# Patient Record
Sex: Male | Born: 1945 | Race: White | Hispanic: No | Marital: Married | Smoking: Former smoker
Health system: Southern US, Community
[De-identification: ages and names within clinical notes are randomized; demographics above are authoritative.]

## PROBLEM LIST (undated history)

## (undated) DIAGNOSIS — R0989 Other specified symptoms and signs involving the circulatory and respiratory systems: Secondary | ICD-10-CM

## (undated) DIAGNOSIS — E119 Type 2 diabetes mellitus without complications: Secondary | ICD-10-CM

## (undated) DIAGNOSIS — E785 Hyperlipidemia, unspecified: Secondary | ICD-10-CM

## (undated) DIAGNOSIS — U071 COVID-19: Secondary | ICD-10-CM

## (undated) DIAGNOSIS — N189 Chronic kidney disease, unspecified: Secondary | ICD-10-CM

## (undated) DIAGNOSIS — I6529 Occlusion and stenosis of unspecified carotid artery: Secondary | ICD-10-CM

## (undated) DIAGNOSIS — K219 Gastro-esophageal reflux disease without esophagitis: Secondary | ICD-10-CM

## (undated) DIAGNOSIS — K759 Inflammatory liver disease, unspecified: Secondary | ICD-10-CM

## (undated) DIAGNOSIS — I2510 Atherosclerotic heart disease of native coronary artery without angina pectoris: Secondary | ICD-10-CM

## (undated) DIAGNOSIS — E781 Pure hyperglyceridemia: Secondary | ICD-10-CM

## (undated) DIAGNOSIS — I10 Essential (primary) hypertension: Secondary | ICD-10-CM

## (undated) DIAGNOSIS — D649 Anemia, unspecified: Secondary | ICD-10-CM

## (undated) DIAGNOSIS — E669 Obesity, unspecified: Secondary | ICD-10-CM

## (undated) DIAGNOSIS — H269 Unspecified cataract: Secondary | ICD-10-CM

## (undated) HISTORY — DX: Chronic kidney disease, unspecified: N18.9

## (undated) HISTORY — DX: Other specified symptoms and signs involving the circulatory and respiratory systems: R09.89

## (undated) HISTORY — PX: OTHER SURGICAL HISTORY: SHX169

## (undated) HISTORY — DX: Pure hyperglyceridemia: E78.1

## (undated) HISTORY — DX: Atherosclerotic heart disease of native coronary artery without angina pectoris: I25.10

## (undated) HISTORY — DX: Inflammatory liver disease, unspecified: K75.9

## (undated) HISTORY — PX: APPENDECTOMY: SHX54

## (undated) HISTORY — PX: KNEE SURGERY: SHX244

## (undated) HISTORY — PX: ANGIOPLASTY: SHX39

## (undated) HISTORY — DX: Type 2 diabetes mellitus without complications: E11.9

## (undated) HISTORY — DX: Obesity, unspecified: E66.9

## (undated) HISTORY — DX: Essential (primary) hypertension: I10

## (undated) HISTORY — DX: Unspecified cataract: H26.9

## (undated) HISTORY — PX: EYE SURGERY: SHX253

## (undated) HISTORY — DX: Anemia, unspecified: D64.9

## (undated) HISTORY — DX: Gastro-esophageal reflux disease without esophagitis: K21.9

## (undated) HISTORY — DX: Occlusion and stenosis of unspecified carotid artery: I65.29

## (undated) HISTORY — DX: Hyperlipidemia, unspecified: E78.5

## (undated) HISTORY — PX: CATARACT EXTRACTION: SUR2

---

## 2001-02-17 ENCOUNTER — Inpatient Hospital Stay (HOSPITAL_COMMUNITY): Admission: RE | Admit: 2001-02-17 | Discharge: 2001-02-19 | Payer: Self-pay | Admitting: Cardiology

## 2001-02-17 ENCOUNTER — Encounter: Payer: Self-pay | Admitting: Cardiology

## 2001-05-14 ENCOUNTER — Emergency Department (HOSPITAL_COMMUNITY): Admission: EM | Admit: 2001-05-14 | Discharge: 2001-05-14 | Payer: Self-pay | Admitting: Emergency Medicine

## 2001-05-14 ENCOUNTER — Encounter: Payer: Self-pay | Admitting: Emergency Medicine

## 2003-09-15 ENCOUNTER — Encounter: Admission: RE | Admit: 2003-09-15 | Discharge: 2003-12-14 | Payer: Self-pay | Admitting: Family Medicine

## 2005-04-17 ENCOUNTER — Ambulatory Visit (HOSPITAL_COMMUNITY): Admission: RE | Admit: 2005-04-17 | Discharge: 2005-04-17 | Payer: Self-pay | Admitting: Cardiology

## 2007-02-01 ENCOUNTER — Emergency Department (HOSPITAL_COMMUNITY): Admission: EM | Admit: 2007-02-01 | Discharge: 2007-02-01 | Payer: Self-pay | Admitting: Emergency Medicine

## 2007-03-05 ENCOUNTER — Encounter: Admission: RE | Admit: 2007-03-05 | Discharge: 2007-03-05 | Payer: Self-pay | Admitting: Orthopedic Surgery

## 2007-04-07 ENCOUNTER — Encounter: Admission: RE | Admit: 2007-04-07 | Discharge: 2007-04-07 | Payer: Self-pay | Admitting: Internal Medicine

## 2007-04-08 ENCOUNTER — Encounter: Admission: RE | Admit: 2007-04-08 | Discharge: 2007-04-08 | Payer: Self-pay | Admitting: Internal Medicine

## 2007-05-11 ENCOUNTER — Ambulatory Visit: Payer: Self-pay | Admitting: Vascular Surgery

## 2007-05-27 ENCOUNTER — Ambulatory Visit: Payer: Self-pay | Admitting: Internal Medicine

## 2007-06-10 ENCOUNTER — Encounter: Payer: Self-pay | Admitting: Internal Medicine

## 2007-06-10 ENCOUNTER — Ambulatory Visit: Payer: Self-pay | Admitting: Internal Medicine

## 2008-07-27 ENCOUNTER — Ambulatory Visit: Payer: Self-pay | Admitting: Vascular Surgery

## 2008-11-04 HISTORY — PX: PR VEIN BYPASS GRAFT,AORTO-FEM-POP: 35551

## 2008-12-14 ENCOUNTER — Encounter: Admission: RE | Admit: 2008-12-14 | Discharge: 2008-12-14 | Payer: Self-pay | Admitting: Cardiology

## 2009-03-17 ENCOUNTER — Encounter: Payer: Self-pay | Admitting: Cardiology

## 2009-03-17 ENCOUNTER — Ambulatory Visit (HOSPITAL_COMMUNITY): Admission: RE | Admit: 2009-03-17 | Discharge: 2009-03-17 | Payer: Self-pay | Admitting: Cardiology

## 2009-03-21 ENCOUNTER — Ambulatory Visit: Payer: Self-pay | Admitting: Cardiothoracic Surgery

## 2009-03-22 ENCOUNTER — Ambulatory Visit: Payer: Self-pay | Admitting: Cardiothoracic Surgery

## 2009-03-22 ENCOUNTER — Inpatient Hospital Stay (HOSPITAL_COMMUNITY): Admission: RE | Admit: 2009-03-22 | Discharge: 2009-03-26 | Payer: Self-pay | Admitting: Cardiothoracic Surgery

## 2009-04-13 ENCOUNTER — Encounter: Admission: RE | Admit: 2009-04-13 | Discharge: 2009-04-13 | Payer: Self-pay | Admitting: Cardiothoracic Surgery

## 2009-04-13 ENCOUNTER — Ambulatory Visit: Payer: Self-pay | Admitting: Cardiothoracic Surgery

## 2009-10-13 ENCOUNTER — Ambulatory Visit (HOSPITAL_COMMUNITY): Admission: RE | Admit: 2009-10-13 | Discharge: 2009-10-14 | Payer: Self-pay | Admitting: Cardiology

## 2010-03-26 ENCOUNTER — Ambulatory Visit: Payer: Self-pay | Admitting: Surgery

## 2010-05-22 ENCOUNTER — Encounter: Payer: Self-pay | Admitting: Internal Medicine

## 2010-07-06 ENCOUNTER — Ambulatory Visit: Payer: Self-pay | Admitting: Vascular Surgery

## 2010-08-30 ENCOUNTER — Ambulatory Visit: Payer: Self-pay | Admitting: Cardiology

## 2010-10-05 ENCOUNTER — Ambulatory Visit: Payer: Self-pay | Admitting: Cardiology

## 2010-10-12 ENCOUNTER — Ambulatory Visit: Payer: Self-pay | Admitting: Vascular Surgery

## 2010-10-31 ENCOUNTER — Ambulatory Visit: Payer: Self-pay | Admitting: Cardiology

## 2010-12-04 NOTE — Letter (Signed)
Summary: Colonoscopy Letter  [facility]  [address] [place], [place] [address]   Phone: [phone]  Fax: [phone]      May 22, 2010 MRN: [id number]   DAKAI BRAITHWAITE [address] [place], [place]  [address]   Dear Mr. BENNINGER,   According to your medical record, it is time for you to schedule a Colonoscopy. The American Cancer Society recommends this procedure as a method to detect early colon cancer. Patients with a family history of colon cancer, or a personal history of colon polyps or inflammatory bowel disease are at increased risk.  This letter has been generated based on the recommendations made at the time of your procedure. If you feel that in your particular situation this may no longer apply, please contact our office.  Please call our office at [phone] to schedule this appointment or to update your records at your earliest convenience.  Thank you for cooperating with [place] to provide you with the very best care possible.   Sincerely,  Wilhemina Bonito. Marina Goodell, M.D.  [facility] [phone]

## 2011-02-05 LAB — CBC
MCHC: 32.3 g/dL (ref 30.0–36.0)
Platelets: 342 10*3/uL (ref 150–400)
RDW: 17.1 % — ABNORMAL HIGH (ref 11.5–15.5)

## 2011-02-05 LAB — BASIC METABOLIC PANEL
BUN: 13 mg/dL (ref 6–23)
CO2: 28 mEq/L (ref 19–32)
Calcium: 8.5 mg/dL (ref 8.4–10.5)
Creatinine, Ser: 1.42 mg/dL (ref 0.4–1.5)
Glucose, Bld: 132 mg/dL — ABNORMAL HIGH (ref 70–99)

## 2011-02-05 LAB — GLUCOSE, CAPILLARY
Glucose-Capillary: 119 mg/dL — ABNORMAL HIGH (ref 70–99)
Glucose-Capillary: 90 mg/dL (ref 70–99)

## 2011-02-05 LAB — CK TOTAL AND CKMB (NOT AT ARMC)
Relative Index: INVALID (ref 0.0–2.5)
Total CK: 70 U/L (ref 7–232)

## 2011-02-12 LAB — POCT I-STAT 3, ART BLOOD GAS (G3+)
Acid-base deficit: 1 mmol/L (ref 0.0–2.0)
Acid-base deficit: 2 mmol/L (ref 0.0–2.0)
Acid-base deficit: 2 mmol/L (ref 0.0–2.0)
Acid-base deficit: 3 mmol/L — ABNORMAL HIGH (ref 0.0–2.0)
Bicarbonate: 21.1 mEq/L (ref 20.0–24.0)
Bicarbonate: 22.4 mEq/L (ref 20.0–24.0)
Bicarbonate: 22.5 mEq/L (ref 20.0–24.0)
Bicarbonate: 23.5 mEq/L (ref 20.0–24.0)
O2 Saturation: 100 %
O2 Saturation: 94 %
O2 Saturation: 95 %
O2 Saturation: 97 %
Patient temperature: 36.1
Patient temperature: 37.1
Patient temperature: 37.2
TCO2: 22 mmol/L (ref 0–100)
TCO2: 24 mmol/L (ref 0–100)
TCO2: 24 mmol/L (ref 0–100)
TCO2: 25 mmol/L (ref 0–100)
pCO2 arterial: 30.3 mmHg — ABNORMAL LOW (ref 35.0–45.0)
pCO2 arterial: 36.8 mmHg (ref 35.0–45.0)
pCO2 arterial: 37.5 mmHg (ref 35.0–45.0)
pCO2 arterial: 37.7 mmHg (ref 35.0–45.0)
pH, Arterial: 7.38 (ref 7.350–7.450)
pH, Arterial: 7.395 (ref 7.350–7.450)
pH, Arterial: 7.404 (ref 7.350–7.450)
pH, Arterial: 7.451 — ABNORMAL HIGH (ref 7.350–7.450)
pO2, Arterial: 282 mmHg — ABNORMAL HIGH (ref 80.0–100.0)
pO2, Arterial: 68 mmHg — ABNORMAL LOW (ref 80.0–100.0)
pO2, Arterial: 71 mmHg — ABNORMAL LOW (ref 80.0–100.0)
pO2, Arterial: 92 mmHg (ref 80.0–100.0)

## 2011-02-12 LAB — POCT I-STAT 4, (NA,K, GLUC, HGB,HCT)
Glucose, Bld: 149 mg/dL — ABNORMAL HIGH (ref 70–99)
Glucose, Bld: 157 mg/dL — ABNORMAL HIGH (ref 70–99)
Glucose, Bld: 175 mg/dL — ABNORMAL HIGH (ref 70–99)
Glucose, Bld: 177 mg/dL — ABNORMAL HIGH (ref 70–99)
Glucose, Bld: 186 mg/dL — ABNORMAL HIGH (ref 70–99)
Glucose, Bld: 204 mg/dL — ABNORMAL HIGH (ref 70–99)
HCT: 24 % — ABNORMAL LOW (ref 39.0–52.0)
HCT: 25 % — ABNORMAL LOW (ref 39.0–52.0)
HCT: 27 % — ABNORMAL LOW (ref 39.0–52.0)
HCT: 32 % — ABNORMAL LOW (ref 39.0–52.0)
HCT: 36 % — ABNORMAL LOW (ref 39.0–52.0)
HCT: 39 % (ref 39.0–52.0)
Hemoglobin: 10.9 g/dL — ABNORMAL LOW (ref 13.0–17.0)
Hemoglobin: 12.2 g/dL — ABNORMAL LOW (ref 13.0–17.0)
Hemoglobin: 13.3 g/dL (ref 13.0–17.0)
Hemoglobin: 8.2 g/dL — ABNORMAL LOW (ref 13.0–17.0)
Hemoglobin: 8.5 g/dL — ABNORMAL LOW (ref 13.0–17.0)
Hemoglobin: 9.2 g/dL — ABNORMAL LOW (ref 13.0–17.0)
Potassium: 3.7 mEq/L (ref 3.5–5.1)
Potassium: 3.8 mEq/L (ref 3.5–5.1)
Potassium: 4.3 mEq/L (ref 3.5–5.1)
Potassium: 4.4 mEq/L (ref 3.5–5.1)
Potassium: 4.4 mEq/L (ref 3.5–5.1)
Potassium: 4.8 mEq/L (ref 3.5–5.1)
Sodium: 133 mEq/L — ABNORMAL LOW (ref 135–145)
Sodium: 134 mEq/L — ABNORMAL LOW (ref 135–145)
Sodium: 135 mEq/L (ref 135–145)
Sodium: 136 mEq/L (ref 135–145)
Sodium: 136 mEq/L (ref 135–145)
Sodium: 138 mEq/L (ref 135–145)

## 2011-02-12 LAB — BLOOD GAS, ARTERIAL
Acid-Base Excess: 2.1 mmol/L — ABNORMAL HIGH (ref 0.0–2.0)
Bicarbonate: 26.1 mEq/L — ABNORMAL HIGH (ref 20.0–24.0)
Drawn by: 206361
FIO2: 0.21 %
O2 Saturation: 94 %
Patient temperature: 98.6
TCO2: 27.3 mmol/L (ref 0–100)
pCO2 arterial: 40.1 mmHg (ref 35.0–45.0)
pH, Arterial: 7.429 (ref 7.350–7.450)
pO2, Arterial: 78 mmHg — ABNORMAL LOW (ref 80.0–100.0)

## 2011-02-12 LAB — GLUCOSE, CAPILLARY
Glucose-Capillary: 101 mg/dL — ABNORMAL HIGH (ref 70–99)
Glucose-Capillary: 108 mg/dL — ABNORMAL HIGH (ref 70–99)
Glucose-Capillary: 109 mg/dL — ABNORMAL HIGH (ref 70–99)
Glucose-Capillary: 110 mg/dL — ABNORMAL HIGH (ref 70–99)
Glucose-Capillary: 127 mg/dL — ABNORMAL HIGH (ref 70–99)
Glucose-Capillary: 132 mg/dL — ABNORMAL HIGH (ref 70–99)
Glucose-Capillary: 133 mg/dL — ABNORMAL HIGH (ref 70–99)
Glucose-Capillary: 135 mg/dL — ABNORMAL HIGH (ref 70–99)
Glucose-Capillary: 136 mg/dL — ABNORMAL HIGH (ref 70–99)
Glucose-Capillary: 136 mg/dL — ABNORMAL HIGH (ref 70–99)
Glucose-Capillary: 140 mg/dL — ABNORMAL HIGH (ref 70–99)
Glucose-Capillary: 150 mg/dL — ABNORMAL HIGH (ref 70–99)
Glucose-Capillary: 153 mg/dL — ABNORMAL HIGH (ref 70–99)
Glucose-Capillary: 154 mg/dL — ABNORMAL HIGH (ref 70–99)
Glucose-Capillary: 157 mg/dL — ABNORMAL HIGH (ref 70–99)
Glucose-Capillary: 160 mg/dL — ABNORMAL HIGH (ref 70–99)
Glucose-Capillary: 161 mg/dL — ABNORMAL HIGH (ref 70–99)
Glucose-Capillary: 161 mg/dL — ABNORMAL HIGH (ref 70–99)
Glucose-Capillary: 163 mg/dL — ABNORMAL HIGH (ref 70–99)
Glucose-Capillary: 164 mg/dL — ABNORMAL HIGH (ref 70–99)
Glucose-Capillary: 164 mg/dL — ABNORMAL HIGH (ref 70–99)
Glucose-Capillary: 175 mg/dL — ABNORMAL HIGH (ref 70–99)
Glucose-Capillary: 176 mg/dL — ABNORMAL HIGH (ref 70–99)
Glucose-Capillary: 180 mg/dL — ABNORMAL HIGH (ref 70–99)
Glucose-Capillary: 43 mg/dL — ABNORMAL LOW (ref 70–99)
Glucose-Capillary: 88 mg/dL (ref 70–99)
Glucose-Capillary: 117 mg/dL — ABNORMAL HIGH (ref 70–99)

## 2011-02-12 LAB — BASIC METABOLIC PANEL
BUN: 10 mg/dL (ref 6–23)
BUN: 12 mg/dL (ref 6–23)
BUN: 14 mg/dL (ref 6–23)
BUN: 16 mg/dL (ref 6–23)
BUN: 9 mg/dL (ref 6–23)
CO2: 23 mEq/L (ref 19–32)
CO2: 27 mEq/L (ref 19–32)
CO2: 28 mEq/L (ref 19–32)
CO2: 28 mEq/L (ref 19–32)
CO2: 28 mEq/L (ref 19–32)
Calcium: 8.1 mg/dL — ABNORMAL LOW (ref 8.4–10.5)
Calcium: 8.1 mg/dL — ABNORMAL LOW (ref 8.4–10.5)
Calcium: 8.2 mg/dL — ABNORMAL LOW (ref 8.4–10.5)
Calcium: 8.2 mg/dL — ABNORMAL LOW (ref 8.4–10.5)
Calcium: 8.8 mg/dL (ref 8.4–10.5)
Chloride: 100 mEq/L (ref 96–112)
Chloride: 103 mEq/L (ref 96–112)
Chloride: 105 mEq/L (ref 96–112)
Chloride: 105 mEq/L (ref 96–112)
Chloride: 107 mEq/L (ref 96–112)
Creatinine, Ser: 1.23 mg/dL (ref 0.4–1.5)
Creatinine, Ser: 1.25 mg/dL (ref 0.4–1.5)
Creatinine, Ser: 1.27 mg/dL (ref 0.4–1.5)
Creatinine, Ser: 1.3 mg/dL (ref 0.4–1.5)
Creatinine, Ser: 1.4 mg/dL (ref 0.4–1.5)
GFR calc Af Amer: >60 mL/min (ref >60)
GFR calc Af Amer: >60 mL/min (ref >60)
GFR calc Af Amer: >60 mL/min (ref >60)
GFR calc Af Amer: >60 mL/min (ref >60)
GFR calc Af Amer: >60 mL/min (ref >60)
GFR calc non Af Amer: 51 mL/min — ABNORMAL LOW (ref >60)
GFR calc non Af Amer: 56 mL/min — ABNORMAL LOW (ref >60)
GFR calc non Af Amer: 57 mL/min — ABNORMAL LOW (ref >60)
GFR calc non Af Amer: 58 mL/min — ABNORMAL LOW (ref >60)
GFR calc non Af Amer: 59 mL/min — ABNORMAL LOW (ref >60)
Glucose, Bld: 129 mg/dL — ABNORMAL HIGH (ref 70–99)
Glucose, Bld: 137 mg/dL — ABNORMAL HIGH (ref 70–99)
Glucose, Bld: 147 mg/dL — ABNORMAL HIGH (ref 70–99)
Glucose, Bld: 181 mg/dL — ABNORMAL HIGH (ref 70–99)
Glucose, Bld: 199 mg/dL — ABNORMAL HIGH (ref 70–99)
Potassium: 3.6 mEq/L (ref 3.5–5.1)
Potassium: 3.8 mEq/L (ref 3.5–5.1)
Potassium: 3.9 mEq/L (ref 3.5–5.1)
Potassium: 4.3 mEq/L (ref 3.5–5.1)
Potassium: 4.5 mEq/L (ref 3.5–5.1)
Sodium: 135 mEq/L (ref 135–145)
Sodium: 136 mEq/L (ref 135–145)
Sodium: 137 mEq/L (ref 135–145)
Sodium: 140 mEq/L (ref 135–145)
Sodium: 140 mEq/L (ref 135–145)

## 2011-02-12 LAB — TYPE AND SCREEN
ABO/RH(D): A POS
Antibody Screen: NEGATIVE

## 2011-02-12 LAB — COMPREHENSIVE METABOLIC PANEL
ALT: 21 U/L (ref 0–53)
AST: 25 U/L (ref 0–37)
Albumin: 3.8 g/dL (ref 3.5–5.2)
Alkaline Phosphatase: 40 U/L (ref 39–117)
BUN: 14 mg/dL (ref 6–23)
CO2: 23 mEq/L (ref 19–32)
Calcium: 9.7 mg/dL (ref 8.4–10.5)
Chloride: 102 mEq/L (ref 96–112)
Creatinine, Ser: 1.48 mg/dL (ref 0.4–1.5)
GFR calc Af Amer: 58 mL/min — ABNORMAL LOW (ref >60)
GFR calc non Af Amer: 48 mL/min — ABNORMAL LOW (ref >60)
Glucose, Bld: 161 mg/dL — ABNORMAL HIGH (ref 70–99)
Potassium: 4.4 mEq/L (ref 3.5–5.1)
Sodium: 135 mEq/L (ref 135–145)
Total Bilirubin: 0.5 mg/dL (ref 0.3–1.2)
Total Protein: 7 g/dL (ref 6.0–8.3)

## 2011-02-12 LAB — CBC
HCT: 27 % — ABNORMAL LOW (ref 39.0–52.0)
HCT: 27.4 % — ABNORMAL LOW (ref 39.0–52.0)
HCT: 27.9 % — ABNORMAL LOW (ref 39.0–52.0)
HCT: 28.1 % — ABNORMAL LOW (ref 39.0–52.0)
HCT: 30.4 % — ABNORMAL LOW (ref 39.0–52.0)
HCT: 30.5 % — ABNORMAL LOW (ref 39.0–52.0)
HCT: 37.9 % — ABNORMAL LOW (ref 39.0–52.0)
Hemoglobin: 10 g/dL — ABNORMAL LOW (ref 13.0–17.0)
Hemoglobin: 10.1 g/dL — ABNORMAL LOW (ref 13.0–17.0)
Hemoglobin: 12.8 g/dL — ABNORMAL LOW (ref 13.0–17.0)
Hemoglobin: 9.1 g/dL — ABNORMAL LOW (ref 13.0–17.0)
Hemoglobin: 9.2 g/dL — ABNORMAL LOW (ref 13.0–17.0)
Hemoglobin: 9.3 g/dL — ABNORMAL LOW (ref 13.0–17.0)
Hemoglobin: 9.4 g/dL — ABNORMAL LOW (ref 13.0–17.0)
MCHC: 32.8 g/dL (ref 30.0–36.0)
MCHC: 33.2 g/dL (ref 30.0–36.0)
MCHC: 33.3 g/dL (ref 30.0–36.0)
MCHC: 33.5 g/dL (ref 30.0–36.0)
MCHC: 33.6 g/dL (ref 30.0–36.0)
MCHC: 33.8 g/dL (ref 30.0–36.0)
MCHC: 33.8 g/dL (ref 30.0–36.0)
MCV: 83.2 fL (ref 78.0–100.0)
MCV: 83.2 fL (ref 78.0–100.0)
MCV: 83.3 fL (ref 78.0–100.0)
MCV: 83.6 fL (ref 78.0–100.0)
MCV: 83.7 fL (ref 78.0–100.0)
MCV: 83.9 fL (ref 78.0–100.0)
MCV: 84 fL (ref 78.0–100.0)
Platelets: 154 10*3/uL (ref 150–400)
Platelets: 177 10*3/uL (ref 150–400)
Platelets: 182 10*3/uL (ref 150–400)
Platelets: 194 10*3/uL (ref 150–400)
Platelets: 197 10*3/uL (ref 150–400)
Platelets: 217 10*3/uL (ref 150–400)
Platelets: 277 10*3/uL (ref 150–400)
RBC: 3.24 MIL/uL — ABNORMAL LOW (ref 4.22–5.81)
RBC: 3.28 MIL/uL — ABNORMAL LOW (ref 4.22–5.81)
RBC: 3.34 MIL/uL — ABNORMAL LOW (ref 4.22–5.81)
RBC: 3.35 MIL/uL — ABNORMAL LOW (ref 4.22–5.81)
RBC: 3.63 MIL/uL — ABNORMAL LOW (ref 4.22–5.81)
RBC: 3.65 MIL/uL — ABNORMAL LOW (ref 4.22–5.81)
RBC: 4.53 MIL/uL (ref 4.22–5.81)
RDW: 14.6 % (ref 11.5–15.5)
RDW: 14.7 % (ref 11.5–15.5)
RDW: 14.9 % (ref 11.5–15.5)
RDW: 15 % (ref 11.5–15.5)
RDW: 15.2 % (ref 11.5–15.5)
RDW: 15.3 % (ref 11.5–15.5)
RDW: 15.4 % (ref 11.5–15.5)
WBC: 10.1 10*3/uL (ref 4.0–10.5)
WBC: 10.8 10*3/uL — ABNORMAL HIGH (ref 4.0–10.5)
WBC: 11.5 10*3/uL — ABNORMAL HIGH (ref 4.0–10.5)
WBC: 12.6 10*3/uL — ABNORMAL HIGH (ref 4.0–10.5)
WBC: 8.1 10*3/uL (ref 4.0–10.5)
WBC: 9.1 10*3/uL (ref 4.0–10.5)
WBC: 9.6 10*3/uL (ref 4.0–10.5)

## 2011-02-12 LAB — CREATININE, SERUM
Creatinine, Ser: 1.29 mg/dL (ref 0.4–1.5)
GFR calc Af Amer: >60 mL/min (ref >60)
GFR calc non Af Amer: 56 mL/min — ABNORMAL LOW (ref >60)

## 2011-02-12 LAB — POCT I-STAT, CHEM 8
BUN: 10 mg/dL (ref 6–23)
BUN: 12 mg/dL (ref 6–23)
Calcium, Ion: 1.14 mmol/L (ref 1.12–1.32)
Calcium, Ion: 1.19 mmol/L (ref 1.12–1.32)
Chloride: 103 mEq/L (ref 96–112)
Chloride: 105 mEq/L (ref 96–112)
Creatinine, Ser: 1.4 mg/dL (ref 0.4–1.5)
Creatinine, Ser: 1.4 mg/dL (ref 0.4–1.5)
Glucose, Bld: 154 mg/dL — ABNORMAL HIGH (ref 70–99)
Glucose, Bld: 185 mg/dL — ABNORMAL HIGH (ref 70–99)
HCT: 30 % — ABNORMAL LOW (ref 39.0–52.0)
HCT: 32 % — ABNORMAL LOW (ref 39.0–52.0)
Hemoglobin: 10.2 g/dL — ABNORMAL LOW (ref 13.0–17.0)
Hemoglobin: 10.9 g/dL — ABNORMAL LOW (ref 13.0–17.0)
Potassium: 4.1 mEq/L (ref 3.5–5.1)
Potassium: 4.4 mEq/L (ref 3.5–5.1)
Sodium: 138 mEq/L (ref 135–145)
Sodium: 139 mEq/L (ref 135–145)
TCO2: 22 mmol/L (ref 0–100)
TCO2: 25 mmol/L (ref 0–100)

## 2011-02-12 LAB — HEMOGLOBIN A1C
Hgb A1c MFr Bld: 6.6 % — ABNORMAL HIGH (ref 4.6–6.1)
Mean Plasma Glucose: 143 mg/dL

## 2011-02-12 LAB — URINALYSIS, ROUTINE W REFLEX MICROSCOPIC
Bilirubin Urine: NEGATIVE
Glucose, UA: NEGATIVE mg/dL
Hgb urine dipstick: NEGATIVE
Ketones, ur: NEGATIVE mg/dL
Leukocytes, UA: NEGATIVE
Nitrite: NEGATIVE
Protein, ur: 30 mg/dL — AB
Specific Gravity, Urine: 1.01 (ref 1.005–1.030)
Urobilinogen, UA: 1 mg/dL (ref 0.0–1.0)
pH: 6 (ref 5.0–8.0)

## 2011-02-12 LAB — HEMOGLOBIN AND HEMATOCRIT, BLOOD
HCT: 25.2 % — ABNORMAL LOW (ref 39.0–52.0)
Hemoglobin: 8.5 g/dL — ABNORMAL LOW (ref 13.0–17.0)

## 2011-02-12 LAB — MAGNESIUM
Magnesium: 2.1 mg/dL (ref 1.5–2.5)
Magnesium: 2.6 mg/dL — ABNORMAL HIGH (ref 1.5–2.5)
Magnesium: 2.7 mg/dL — ABNORMAL HIGH (ref 1.5–2.5)

## 2011-02-12 LAB — URINE MICROSCOPIC-ADD ON

## 2011-02-12 LAB — PROTIME-INR
INR: 1 (ref 0.00–1.49)
INR: 1.5 (ref 0.00–1.49)
Prothrombin Time: 13.6 seconds (ref 11.6–15.2)
Prothrombin Time: 18.6 seconds — ABNORMAL HIGH (ref 11.6–15.2)

## 2011-02-12 LAB — CK TOTAL AND CKMB (NOT AT ARMC)
CK, MB: 6.2 ng/mL — ABNORMAL HIGH (ref 0.3–4.0)
Relative Index: 2.2 (ref 0.0–2.5)
Total CK: 280 U/L — ABNORMAL HIGH (ref 7–232)

## 2011-02-12 LAB — ABO/RH: ABO/RH(D): A POS

## 2011-02-12 LAB — APTT
aPTT: 27 seconds (ref 24–37)
aPTT: 33 seconds (ref 24–37)

## 2011-02-12 LAB — PLATELET COUNT: Platelets: 244 10*3/uL (ref 150–400)

## 2011-03-19 NOTE — Procedures (Signed)
CAROTID DUPLEX EXAM   INDICATION:  Carotid artery bruit.   HISTORY:  Diabetes:  Yes.  Cardiac:  Coronary artery disease, PTA and stent.  Hypertension:  Yes.  Smoking:  No.  Previous Surgery:  Right knee surgery x2 since March 2008.  Amaurosis Fugax No, Paresthesias No, Hemiparesis No.   REFERRING PHYSICIAN:  W. Buren Kos, M.D.                                       RIGHT             LEFT  Brachial systolic pressure:         128.              124.  Brachial Doppler waveforms:         Triphasic.        Triphasic.  Vertebral direction of flow:        Antegrade.        Antegrade.  DUPLEX VELOCITIES (cm/sec)  CCA peak systolic                   87.               99.  ECA peak systolic                   139.              116.  ICA peak systolic                   187.              141.  ICA end diastolic                   65.               51.  PLAQUE MORPHOLOGY:                  Mixed.            Mixed.  PLAQUE AMOUNT:                      Moderate.         Moderate.  PLAQUE LOCATION:                    ICA, ECA.         ICA.   IMPRESSION:  1. Bilateral 40-59% internal carotid artery stenosis, upper end of      range on the right.  2. Mild right external carotid artery stenosis.  3. A preliminary copy was faxed to Dr. Alver Fisher office and a report was      called to the office.   ___________________________________________  Di Kindle. Edilia Bo, M.D.   DP/MEDQ  D:  05/11/2007  T:  05/12/2007  Job:  [id number]
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
## 2011-03-19 NOTE — Assessment & Plan Note (Signed)
OFFICE VISIT   QADIR, FOLKS  DOB:  [birth year]                                       10/12/2010  UXLKG#:[id number]   This is an established patient.   HISTORY OF PRESENT ILLNESS:  This is a 65 year old gentleman well-known  to me that was found to have previously an increase in his left internal  carotid artery stenosis.  At this point he continues to be completely  asymptomatic without any symptoms of stroke or TIA.  There has been no  change in his medication profile and he does note that he has been  making a concerted effort to lose weight and he has had some weight loss  since then; otherwise there have been no changes in his neurologic  status or any of his medications.   His past medical history, past surgical history, social history, family  history, medications and allergies are completely identical to the  September 2 visit.   On his review of systems today he noted weight loss, change in eyesight  and hiatal hernia.  Otherwise the rest of his review of systems was  negative.   PHYSICAL EXAMINATION:  Today he had a blood pressure 135/81 in the left  arm.  In his right arm he had a blood pressure of 124/75.  Heart rate  was 80.  His respirations were 12, satting 99%.  General:  He is well-  developed, well-nourished.  Neurologic:  Cranial nerves II-XII were  intact.  His motor strength was 5/5.  Sensation was grossly intact in  all extremities.  In his right arm he had palpable radial and brachials  and he had complete sensation intact throughout this extremity.   On his noninvasive vascular imaging he had bilateral carotids done.  On  the right side his velocity was lower than last time with peak systolic  172 cm/sec consistent with 40%-59% stenosis.  On his left side he had a  peak systolic velocity of 228 cm/sec and this is consistent with a left  internal carotid artery stenosis 60%-79%.  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   MEDICAL DECISION MAKING:  This is a pleasant 65 year old gentleman who  has asymptomatic bilateral internal carotid artery stenosis.  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  Additionally his pulses are completely intact in right upper  extremity.  He has no symptomatology consistent with vertebrobasilar  pathology.  I discussed with the patient that confirmatory studies would  likely include an angiogram.  Any procedure crossing the arch carries  approximately 1% stroke rate in the major studies so I did not think  confirmatory study in his case was indicated as he is completely  asymptomatic.  At this point we will continue with q.6 month  surveillance of his bilateral internal carotid artery stenosis.  With  less than 80% stenoses bilateral, there is no compelling indication at  this point for proceeding with endarterectomy on either side.  If he  develops any symptomatology from regards to his right subclavian artery  at that point we will readdress the issue and possibly angio the arch  and also the right arm.  Thank you  for giving me the opportunity to  participate in this patient's care.     Leonides Sake, MD  Electronically Signed   BC/MEDQ  D:  10/12/2010  T:  10/12/2010  Job:  [id number]   cc:   Kari Baars, M.D.

## 2011-03-19 NOTE — Procedures (Signed)
CAROTID DUPLEX EXAM   INDICATION:  Follow up carotid stenosis.   HISTORY:  Diabetes:  Yes.  Cardiac:  CAD, stent.  Hypertension:  Yes.  Smoking:  No.  Previous Surgery:  CV History:  Amaurosis Fugax No, Paresthesias No, Hemiparesis No.                                       RIGHT             LEFT  Brachial systolic pressure:         148               142  Brachial Doppler waveforms:         Normal            Normal  Vertebral direction of flow:        Antegrade/hesitant                  Antegrade  DUPLEX VELOCITIES (cm/sec)  CCA peak systolic                   94                87  ECA peak systolic                   126               86  ICA peak systolic                   170               158  ICA end diastolic                   61                49  PLAQUE MORPHOLOGY:                  Heterogenous      Heterogenous  PLAQUE AMOUNT:                      Moderate          Moderate  PLAQUE LOCATION:                    ICA               ICA   IMPRESSION:  1. 40-59% stenosis of the bilateral internal carotid arteries.  2. The right vertebral artery flow demonstrates early systolic      deceleration with an elevated velocity of 270 cm/s noted in the      proximal right subclavian artery.  3. No significant change noted in the bilateral carotid arteries when      compared to the previous examination of 05/11/07.   A preliminary report was faxed to Dr. Alver Fisher office at 4:15 on 07/27/08.   ___________________________________________  Larina Earthly, M.D.   CH/MEDQ  D:  07/27/2008  T:  07/27/2008  Job:  [id number]

## 2011-03-19 NOTE — Op Note (Signed)
NAMEVICENTE, WEIDLER                  ACCOUNT NO.:  [id number]   MEDICAL RECORD NO.:  [id number]          PATIENT TYPE:  INP   LOCATION:  2310                         FACILITY:  [facility]   PHYSICIAN:  Sheliah Plane, MD    DATE OF BIRTH:  04-19-1946   DATE OF PROCEDURE:  03/22/2009  DATE OF DISCHARGE:                               OPERATIVE REPORT   PREOPERATIVE DIAGNOSIS:  Coronary occlusive disease with diabetes and  positive stress test.   POSTOPERATIVE DIAGNOSIS:  Coronary occlusive disease with diabetes and  positive stress test.   SURGICAL PROCEDURE:  Coronary artery bypass grafting x3 with left  internal mammary to the left anterior descending coronary artery,  reversed saphenous vein graft to the obtuse marginal coronary artery,  reversed saphenous vein graft to the posterior descending coronary  artery with right endovein harvesting.   SURGEON:  Sheliah Plane, MD.   FIRST ASSISTANT:  Evelene Croon, MD   SECOND ASSISTANT:  Rowe Clack, PA-C   BRIEF HISTORY:  The patient is a 65 year old diabetic male with  longstanding history of coronary artery disease, having had multiple  angioplasties as early as 1995.  In addition, he has had stent placed in  his proximal circumflex coronary artery.  He presents now with abnormal  Cardiolite stress test with lateral ischemia.  Cardiac catheterization  was performed, which demonstrated diffuse disease throughout the LAD,  diffuse disease in the right coronary artery with a relatively small  posterior descending and a moderate-sized obtuse marginal vessel with a  90-95% proximal stenosis just before the coronary stent.  Because of the  patient's diffuse disease and unsuitability of angioplasty on the  proximal circumflex vessel, coronary artery bypass grafting was  recommended to the patient, who agreed and signed informed consent.   DESCRIPTION OF PROCEDURE:  With Swan-Ganz and arterial line monitors in  place, the patient  underwent general endotracheal anesthesia without  incident.  Skin of chest and legs was prepped with Betadine and draped  in the usual sterile manner.  Using the [employer] endovein harvesting  system, a vein was ultimately harvested from the right; however,  initially we made a small incision at the left knee; however, the vein  at this area was small and not usable, so vein was located in the right  and was of good quality and caliber.  Median sternotomy was performed  and left internal mammary artery was dissected down to his pedicle  graft.  The distal artery was divided, had good free flow.  Pericardium  was opened.  Overall ventricular function appeared preserved.  He was  systemically heparinized.  Ascending aorta was cannulated.  The right  atrium was cannulated and aortic root vent cardioplegia needle was  introduced into the ascending aorta.  The patient was placed on  cardiopulmonary bypass 2.4 L/min/m2.  Consideration for placing of the  left internal mammary to the circumflex was entertained; however, the  side of anastomosis was relatively high on the lateral wall and it was  decided it would allow better to place the mammary to the distal LAD.  Aortic crossclamp was applied and 500 mL of cold blood potassium  cardioplegia was administered with diastolic arrest.  The heart  myocardial septal temperature was monitored throughout the crossclamp  period.  Attention was turned first to the circumflex coronary artery,  which was partially intramyocardial.  The vessel was opened and was  reasonable size, but with some diffuse disease.  Using a running 7-0  Prolene, distal anastomosis was performed with a second reverse  saphenous vein graft.  Additional cold blood cardioplegia was  administered down the vein.  Attention was then turned to the posterior  descending coronary artery, which was a relatively small vessel and  admitted a 1-mm probe distally.  Using a running 7-0 Prolene,  distal  anastomosis with a second reverse saphenous vein graft was carried out.  Attention was then turned to the LAD. which was a very diffusely  diseased vessel in the distal third, vessel was opened, admitted a 1-mm  probe distally and proximally.  Using a running 8-0 Prolene, left  internal mammary artery was anastomosed to the left anterior descending  coronary artery.  There was rise of myocardial septal temperature with  release of bulldog on the mammary artery.  The bulldog was placed back  on the mammary artery.  Additional cold blood cardioplegia was  administered with the crossclamps still in place, two punch aortotomies  were performed in the ascending aorta and each of the 2 vein grafts were  anastomosed to the ascending aorta.  Air was evacuated from the grafts  and sites of anastomosis were observed and free of bleeding.  The  patient was then ventilated and weaned from cardiopulmonary bypass  without difficulty, converted to a sinus rhythm spontaneously.  Decannulated in usual fashion.  Protamine sulfate was administered with  operative field hemostatic.  Two atrial, two ventricular pacing wires  were applied.  Graft markers were applied.  A left pleural tube and  Blake mediastinal drain were left in place.  Sternum was closed with #6  stainless steel wire.  Fascia closed with interrupted 0 Vicryl, running  3-0 Vicryl in subcutaneous tissue, and a 4-0 subcuticular stitch in the  skin edges.  Dry dressings were applied.  Sponge and needle count was  reported as correct at completion of the procedure.  The patient  tolerated the procedure without obvious complication and was transferred  to surgical intensive care unit for further postoperative care.      Sheliah Plane, MD  Electronically Signed     EG/MEDQ  D:  03/23/2009  T:  03/24/2009  Job:  [id number]   cc:   Colleen Can. Deborah Chalk, M.D.
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
## 2011-03-19 NOTE — Procedures (Signed)
CAROTID DUPLEX EXAM   INDICATION:  Followup carotid artery disease.   HISTORY:  Diabetes:  Yes.  Cardiac:  CAD and a stent.  Hypertension:  Yes.  Smoking:  No.  Previous Surgery:  No.  CV History:  The patient is currently asymptomatic.  Amaurosis Fugax No, Paresthesias No, Hemiparesis No                                       RIGHT             LEFT  Brachial systolic pressure:         138               142  Brachial Doppler waveforms:         WNL               WNL  Vertebral direction of flow:        Partial subclavian steal            Antegrade  DUPLEX VELOCITIES (cm/sec)  CCA peak systolic                   71                64  ECA peak systolic                   121               117  ICA peak systolic                   172               228  ICA end diastolic                   53                75  PLAQUE MORPHOLOGY:                  Heterogeneous     Heterogeneous  PLAQUE AMOUNT:                      Moderate          Moderate  PLAQUE LOCATION:                    ICA and bulb      ICA and bulb   IMPRESSION:  Right-sided internal carotid artery 40%-59% stenosis.  Left  internal carotid artery 60%-79% stenosis.  Bilateral intimal thickening  within the common carotid arteries.  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  Study stable compared to previous.   ___________________________________________  Leonides Sake, MD   OD/MEDQ  D:  10/12/2010  T:  10/12/2010  Job:  [id number]

## 2011-03-19 NOTE — Cardiovascular Report (Signed)
NAMEKARMINE, KAUER NO.:  [id number]   MEDICAL RECORD NO.:  [id number]          PATIENT TYPE:  OIB   LOCATION:  2899                         FACILITY:  [facility]   PHYSICIAN:  Colleen Can. Deborah Chalk, M.D.DATE OF BIRTH:  [birth year]   DATE OF PROCEDURE:  03/17/2009  DATE OF DISCHARGE:  03/17/2009                            CARDIAC CATHETERIZATION   PROCEDURE:  Left heart catheterization with selective coronary  angiography and left ventricular angiography.   TYPE AND SITE OF ENTRY:  Percutaneous right femoral artery, percutaneous  right femoral vein.   CATHETERS:  A 6-French 4 curved Judkins right and left coronary  catheter, 6-French pigtail ventriculographic catheter.   CONTRAST MATERIAL:  Omnipaque.   MEDICATIONS GIVEN PRIOR TO PROCEDURE:  Valium 10 mg p.o.   MEDICATIONS GIVEN DURING THE PROCEDURE:  Versed 3 mg IV, Ancef 1 g IV.   COMMENTS:  The patient tolerated the procedure well.   HEMODYNAMIC DATA:  The aortic pressure was 158/74, LV was 160/11-21.  There was no aortic valve gradient noted on pullback.   ANGIOGRAPHIC DATA:  Left ventricular angiogram was performed in the RAO  position.  The overall cardiac size and silhouette were normal.  The  global ejection fraction was estimated to be 60%.  Regional wall motion  was normal.  There was no mitral regurgitation.   CORONARY ARTERIES:  Coronary arteries arise and distribute normally.  1. Right coronary artery:  The right coronary artery is a dominant      vessel.  There is somewhat diffuse irregularities proximally, but      prior to the crux, there is a 70% stenosis that is somewhat focal.      The posterior descending and posterolateral branch are relatively      small vessels.  There appears to be some collateral flow into the      distal posterolateral branch from the left coronary system.  The      posterior descending vessel would appear to have some moderate      disease proximally but should be  suitable for bypass grafting.  2. Left main coronary artery has mild distal narrowing.  3. Left circumflex:  The left circumflex has a 95% stenosis      approximately 2 mm from the ostium.  This is before the stent that      was placed several years ago.  The left circumflex continues as a      moderately large obtuse marginal that has 50% narrowing in its      midportion.  4. Left anterior descending.  The left anterior descending has diffuse      disease throughout its vessel length.  In the midportion of the      vessel, there is 70-80% narrowing and in the midportion there are 2      sequential lesions of approximately 80-90% stenosis.   There is a small intermediate coronary that has somewhat reduced flow.   OVERALL IMPRESSION:  1. Normal left ventricular function.  2. Severe three-vessel coronary disease with an ostial left circumflex  lesion, mild ostial LAD stenosis but then severe stenosis      sequentially in the midportion of the vessel that is diffusely      diseased throughout, small intermediate vessel that possibly is      bypassable that arises in the area of severe stenosis near the      bifurcating circumflex and LAD; and moderate diffuse disease in the      right coronary artery with a 70% stenosis prior to the crux with      satisfactory distal vessels.   DISCUSSION:  In light of these findings, it is felt that Young would be  best served with coronary artery bypass grafting.  This will be arranged  an elective basis.      Colleen Can. Deborah Chalk, M.D.  Electronically Signed     SNT/MEDQ  D:  03/17/2009  T:  03/17/2009  Job:  [id number]
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
## 2011-03-19 NOTE — Procedures (Signed)
CAROTID DUPLEX EXAM   INDICATION:  Follow up carotid artery disease.   HISTORY:  Diabetes:  Yes.  Cardiac:  CAD, stent.  Hypertension:  Yes.  Smoking:  No.  Previous Surgery:  No.  CV History:  Asymptomatic.  Amaurosis Fugax No, Paresthesias No, Hemiparesis No.                                       RIGHT             LEFT  Brachial systolic pressure:         138               140  Brachial Doppler waveforms:         WNL               WNL  Vertebral direction of flow:        Antegrade, atypical                 Antegrade  DUPLEX VELOCITIES (cm/sec)  CCA peak systolic                   80                101  ECA peak systolic                   142               146  ICA peak systolic                   188               236  ICA end diastolic                   65                74  PLAQUE MORPHOLOGY:                  Heterogenous      Heterogenous  PLAQUE AMOUNT:                      Moderate          Moderate  PLAQUE LOCATION:                    ICA/bifurcation   ICA/bifurcation   IMPRESSION:  1. Right internal carotid artery shows evidence of 40% to 59% stenosis      and appears stable from study done on 08/02/2008.  2. Left internal carotid artery shows evidence of 60% to 79% stenosis      (low end of range), an increase from previous study.  3. Bilateral vertebral arteries are antegrade; however, right has      abnormal waveform morphology with stable elevated velocities of 246      cm/s in the right subclavian artery.   ___________________________________________  V. Charlena Cross, MD   AS/MEDQ  D:  03/26/2010  T:  03/26/2010  Job:  [id number]

## 2011-03-19 NOTE — Assessment & Plan Note (Signed)
OFFICE VISIT   Matthew Bates, Matthew Bates  DOB:  1945-12-25                                        April 13, 2009  CHART #:  [id number]   The patient returns to the office today after recent coronary artery  bypass grafting x3 with the left internal mammary to the left anterior  descending coronary artery, reverse saphenous vein graft to the obtuse  marginal coronary artery, and reverse saphenous vein graft to the  posterior descending coronary artery using right endovein harvesting.  Initially, a small incision was made at the left knee, however, the vein  in this area was small and unusable.  Since discharge, the patient has  made good progress.  He has increased his physical activity  appropriately.  He has had no obvious congestive heart failure or any  recurrent anginal symptoms.  He has enrolled to starting the cardiac  rehab program next week.   PHYSICAL EXAMINATION:  VITAL SIGNS:  His blood pressure is 143/89, pulse  is 100, respiratory rate is 18, and O2 sat is 98%.  CHEST:  His sternum is stable and well healed.  EXTREMITIES:  The right thigh endovein harvest site is also healing  well.  The incision at the left knee without any associated endovascular  tunnel is also healing.  He has no pedal edema.   Chest x-ray shows clear lung fields bilaterally.   He continues on his discharge medications including aspirin 325 mg a  day, Toprol-XL 25 mg a day, metformin 1000 mg twice a day, Actos 45 mg a  day, lisinopril 40 mg a day, Prilosec over the counterVytorin 10/40  daily, Trilipix 135 mg a day, and folic acid 0.4 mg a day.   Overall, I am very pleased with his progress postoperatively after his  coronary artery bypass grafting x3.  He starts in a cardiac rehab  program next week.  I have again reviewed with him the  need for strict diabetes control in the future.  I have not made him a  return appointment to see me specifically, but would be glad to at his  or  Dr. Ronnald Nian request.   Sheliah Plane, MD  Electronically Signed   EG/MEDQ  D:  04/13/2009  T:  04/13/2009  Job:  [id number]   cc:   Colleen Can. Deborah Chalk, M.D.  Dr. Clelia Croft.
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
## 2011-03-22 NOTE — Discharge Summary (Signed)
[place]  Patient:    Matthew Bates, Matthew Bates                         MRN: [id number] Adm. Date:  [id number] Disc. Date: [id number] Attending:  Eleanora Neighbor Dictator:   Jennet Maduro. Earl Gala, R.N., A.N.P.                           Discharge Summary  DISCHARGE DIAGNOSIS:  Preinfarction angina with subsequent emergent cardiac catheterization with percutaneous coronary intervention with angioplasty and stenting of the left circumflex.  SECONDARY DIAGNOSES: 1. Known atherosclerotic cardiovascular disease with a prior history of    angioplasty to the left anterior descending in 1994, 1995, 1998.    Angioplasty of the right coronary artery x 2 in 1998.  Unsuccessful    angioplasty of the posterior descending in May 1998. 2. Hypercholesterolemia, on Lipitor. 3. Hypertension, currently to be initiated on hydrochlorothiazide. 4. Status post esophageal reflux disease, maintained on proton pump inhibitor.  HISTORY OF PRESENT ILLNESS:  Matthew Bates is a 65 year old male who presents to the emergency room at [facility] at approximately 4 a.m. on the morning of February 17, 2001, with the onset of vague indigestion, a burping sensation, as well as tightness in the chest.  He has always demonstrated a minor chest pain syndrome as part of his previous anginal equivalent.  He was referred to the emergency room where a 12-lead electrocardiogram showed nonspecific changes.  In light of his ongoing symptomatology, he was referred on for emergent cardiac catheterization and was subsequently transferred to [facility] for further evaluation and intervention.  Please see the dictated history and physical per Dr. Roger Shelter for further patient presentation and profile.  LABORATORY DATA:  On admission the CK total was 247 with an MB of 2.8, troponin was negative at 0.04.  CBC on admission was within normal limits. Chemistries on admission showed a sodium of 140,  potassium 3.8, chloride 107, CO2 26, BUN 17, creatinine 1.2, and a glucose of 130.  HOSPITAL COURSE:  The patient was taken emergently over to [facility] for emergent cardiac catheterization as well as possible percutaneous coronary intervention.  This procedure was performed by Dr. Roger Shelter without any known complications.  The findings are as noted:  A reasonably well-preserved left ventricular function with minimal inferior hypokinesis, severe stenosis of the left circumflex marginal vessel with successful stenting but with mild to moderate distal disease, scattered diffuse irregularities in the left anterior descending with an 80-90% second diagonal stenosis; satisfactory patency of the right coronary artery with subtotal posterolateral branch with persistent patency in the posterior descending and persistent patency of the true posterolateral branch.  A 3.5 x 18 mm ______ stent was subsequently placed to the left circumflex.  There was some distal mismatch but overall was felt to be satisfactory placement.  Postprocedure he did receive 2B-3A inhibitors.  He was given Plavix as well.  He was transferred to 6500 for further monitoring and evaluation.  He did have a small bump in cardiac enzymes with peak CK-MB of 7.3, troponin did rise at 1.14.  Follow-up EKG showed inferolateral T-wave changes.  At that time it was felt that we would need to watch Matthew Bates in the hospital for another 24 hours in light of the very small rise in cardiac enzymes.  From a cardiac standpoint he remained stable.  Also  during this hospitalization carotid Dopplers were performed due to possible right carotid bruit which showed no ICA stenosis.  There is mild to moderate calcific and noncalcific plaque in the bifurcation as well as throughout the ICA bilaterally.  Today on February 19, 2001, he is doing well with no complaints.  He has had no recurrent discomfort.  Vital signs have shown the  blood pressure to be somewhat elevated, and he will be started on hydrochlorothiazide today. CK-MBs have decreased quite nicely as well as troponin I, and overall he is felt to be a stable candidate for discharge today.  CONDITION ON DISCHARGE:  Stable.  DISCHARGE MEDICATIONS: 1. He will resume:    a. Prinivil 20 mg a day.    b. Prevacid 15 mg a day.    c. Lipitor 20 mg a day.    d. Multivitamin daily.    e. Tenormin 50 mg a day.    f. Aspirin daily. 2. Will add:    a. Plavix 75 mg a day for the next 21 days.    b. Hydrochlorothiazide 25 mg, take 1/2 tablet daily.  ACTIVITY:  Light.  DIET:  Low fat.  WOUND CARE:  He is to call us for any problems.  Otherwise may use an ice pack as needed.  FOLLOW-UP:  Will have him follow up in the office with Dr. Deborah Chalk on Monday, March 02, 2001, at 12:30 p.m. or sooner if any problems arise. DD:  02/19/01 TD:  02/20/01 Job: [id number]

## 2011-03-22 NOTE — Cardiovascular Report (Signed)
[place]  Patient:    Matthew Bates, Matthew Bates                         MRN: [id number] Proc. Date: 02/17/01 Adm. Date:  [id number] Attending:  Eleanora Neighbor                        Cardiac Catheterization  HISTORY:  [name] presented with acute onset of substernal chest pain.  It was somewhat minimal and more associated with indigestion-like feeling.  This was some of his previous anginal symptoms.  PROCEDURE:  Left heart catheterization with selective coronary angiography, left ventricular angiography, and percutaneous coronary intervention with angioplasty and stenting of the left circumflex.  CARDIOLOGIST:  Colleen Can. Deborah Chalk, M.D.  SITE OF ENTRY: Percutaneous right femoral artery.  CATHETERS: 6-French four curved Judkins right and left catheters, 6-French pigtail ventriculography catheter, 7-French JL4 guide (slightly larger than ideal), high-torque floppy guidewire, a 3.0 x 20 mm CrossSail balloon and subsequently a 3.5 x 18 mm Penta stent.  MEDICATIONS GIVEN PRIOR TO PROCEDURE:  Valium 10 mg p.o., heparin, and nitroglycerin.  MEDICATIONS GIVEN DURING PROCEDURE:  Versed 2 mg IV and Integrilin.  COMMENTS:  The patient tolerated the procedure well.  HEMODYNAMIC DATA:  The aortic pressure was 147/87.  LV was 153/30.  There was no aortic valve gradient noted on pullback.  ANGIOGRAPHIC DATA:  1. Left main coronary artery is normal. 2. Left circumflex:  The left circumflex had a severe 90% focal lesion in    a large obtuse marginal.  There were distal irregularities in the vessel,    and it was somewhat tortuous distally.  There were irregularities in a    continuation branch. 3. Left anterior descending artery: The left anterior descending had diffuse    irregularities, but there were no severe stenoses across the apex.  The    first diagonal vessel was satisfactory.  The second diagonal vessel was    small and had a 90% ostial  narrowing.  It would be less than 1.5 mm. 4. Right coronary artery: The right coronary artery was a large dominant    vessel.  There was a posterolateral branch that was occluded.  This was    a small branch and between the posterior descending and true posterolateral    branch.  There were scattered diffuse irregularities in the distal    right coronary artery.  Left ventricular angiogram:  Left ventricular angiogram was performed in the RAO position.  Overall cardiac size and silhouette were normal.  There was very minimal inferior hypokinesis.  The global ejection fraction would be in the 55% range  ANGIOPLASTY PROCEDURE:  We initially used a JL4 guide and a high-torque floppy guidewire, and we were able to cross the lesion without any difficulty.  Using a 3.0 x 20 mm CrossSail balloon, we predilated.  It was the feeling that probably there was more segmental disease proximally, and this size proximally was a 3.5 mm vessel.  We placed a 3.5 x 18 mm Penta stent.  There was some distal mismatch, but it was felt to be a satisfactory placement.  The distal portion of the stent tapered abruptly into the distal vessel but still is a satisfactory fit and overall satisfactory stent placement and final result.  OVERALL IMPRESSION: 1. Reasonably well preserved left ventricular function with minimal inferior    hypokinesis. 2. Severe stenosis of the  left circumflex marginal vessel with successful    stenting but with mild to moderate distal disease. 3. Scattered diffuse irregularities in the left anterior descending with an    80 to 90% second diagonal stenosis; satisfactory patency of the right    coronary artery with subtotal posterolateral branch with persistent    patency in the posterior descending and persistent patency of the true    posterolateral branch.  DISCUSSION:  It is felt that we clinically improved [name] situation today. There has to be some concern about the diffusness of  his coronary atherosclerosis, but it appears to be satisfactory stenting, and this probably was the acute problem presenting to the emergency room. DD:  02/17/01 TD:  02/18/01 Job: [id number]

## 2011-04-19 ENCOUNTER — Other Ambulatory Visit (INDEPENDENT_AMBULATORY_CARE_PROVIDER_SITE_OTHER): Payer: Medicare Other

## 2011-04-19 DIAGNOSIS — I6529 Occlusion and stenosis of unspecified carotid artery: Secondary | ICD-10-CM

## 2011-04-19 NOTE — Assessment & Plan Note (Signed)
OFFICE VISIT  Matthew Bates, Matthew Bates DOB:  15-Feb-1946                                       04/19/2011 ZHYQM#:[id number]  DIAGNOSIS:  Severe left carotid artery stenosis.  Matthew Bates returned today for his followup carotid duplex scan at our vascular lab.  The vascular technician asked me to see Matthew Bates due to increase in his study values.  His right internal carotid artery stenosis is now in the 60%-79% range and the left internal carotid artery stenosis is in the 80%-99% range and this is an increase in the disease process bilaterally since his previous study in December of 2011.  There is to-and-fro flow noted in the right vertebral artery suggestive of a more significant disease process such as steal syndrome or proximal stenosis.  The patient states that he has had absolutely no symptoms and denies amaurosis fugax, any hemiparesis or paresthesias.  He also denies any vision changes or temporary blindness.  I did speak to Dr. Imogene Burn by phone about this patient.  He has asked me to get a preoperative evaluation for risk factor stratification and cardiac risk for surgery.  Our office will set this up within the next week and the patient will return to see Dr. Imogene Burn about a week after that.  The patient knows to call the office if he has any changes in his signs or symptoms.  Matthew Bates, [place]  Fransisco Hertz, MD Electronically Signed  SE/MEDQ  D:  04/19/2011  T:  04/19/2011  Job:  [id number]

## 2011-04-25 NOTE — Procedures (Unsigned)
CAROTID DUPLEX EXAM  INDICATION:  Followup carotid stenosis.  HISTORY: Diabetes:  Yes. Cardiac:  CAD, CABG 03/18/2009. Hypertension:  Yes. Smoking:  Previous. Previous Surgery:  No. CV History:  Asymptomatic. Amaurosis Fugax No, Paresthesias No, Hemiparesis No                                      RIGHT             LEFT Brachial systolic pressure:         156               154 Brachial Doppler waveforms:         WNL               WNL Vertebral direction of flow:        To-fro            Antegrade DUPLEX VELOCITIES (cm/sec) CCA peak systolic                   102               100 ECA peak systolic                   104               137 ICA peak systolic                   202               357 ICA end diastolic                   57                122 PLAQUE MORPHOLOGY:                  Calcified         Calcified PLAQUE AMOUNT:                      Moderate          Severe PLAQUE LOCATION:                    CCA, ICA, ECA     CCA, ICA  IMPRESSION: 1. Right internal carotid artery stenosis in the 60%-79% range. 2. Left internal carotid artery stenosis in the 80%-99% range. 3. This is an increase in the disease process bilaterally since     previous study 10/12/2010. 4. To-fro flow noted in the right vertebral artery suggestive of a     more significant disease process such as steal syndrome or proximal     stenosis. 5. The patient was seen by the PA in clinic on 04/19/2011 following     this exam.  ___________________________________________ Fransisco Hertz, MD  SH/MEDQ  D:  04/19/2011  T:  04/19/2011  Job:  [id number]

## 2011-04-26 ENCOUNTER — Encounter: Payer: Self-pay | Admitting: *Deleted

## 2011-04-26 ENCOUNTER — Encounter: Payer: Self-pay | Admitting: Cardiology

## 2011-04-29 ENCOUNTER — Encounter: Payer: Self-pay | Admitting: Cardiology

## 2011-04-29 ENCOUNTER — Ambulatory Visit (INDEPENDENT_AMBULATORY_CARE_PROVIDER_SITE_OTHER): Payer: Medicare Other | Admitting: Cardiology

## 2011-04-29 DIAGNOSIS — R5381 Other malaise: Secondary | ICD-10-CM

## 2011-04-29 DIAGNOSIS — I2510 Atherosclerotic heart disease of native coronary artery without angina pectoris: Secondary | ICD-10-CM

## 2011-04-29 DIAGNOSIS — G4733 Obstructive sleep apnea (adult) (pediatric): Secondary | ICD-10-CM

## 2011-04-29 DIAGNOSIS — R5383 Other fatigue: Secondary | ICD-10-CM

## 2011-04-29 DIAGNOSIS — I10 Essential (primary) hypertension: Secondary | ICD-10-CM

## 2011-04-29 DIAGNOSIS — I25810 Atherosclerosis of coronary artery bypass graft(s) without angina pectoris: Secondary | ICD-10-CM

## 2011-04-29 DIAGNOSIS — I6529 Occlusion and stenosis of unspecified carotid artery: Secondary | ICD-10-CM

## 2011-04-29 DIAGNOSIS — E785 Hyperlipidemia, unspecified: Secondary | ICD-10-CM

## 2011-04-29 MED ORDER — LABETALOL HCL 200 MG PO TABS: 200.0000 mg | ORAL_TABLET | Freq: Two times a day (BID) | ORAL | Status: DC

## 2011-04-29 NOTE — Assessment & Plan Note (Signed)
Suspect OSA.  Will refer for sleep study to be done after he has recovered from his surgery.

## 2011-04-29 NOTE — Assessment & Plan Note (Signed)
Patient is getting lipids via PCP's office in 7/12.  I will ask that a copy be forwarded here.  Goal LDL < 70.

## 2011-04-29 NOTE — Assessment & Plan Note (Signed)
Severe asymptomatic LICA stenosis.  To see Dr. Imogene Burn later this week.  OK to proceed to CEA without stress testing as he has been symptomatically stable.  Continue cardiac meds peri-operatively especially beta blocker.

## 2011-04-29 NOTE — Assessment & Plan Note (Signed)
Patient's BP is mildly elevated today. He is currently taking a low dose of metoprolol XL as well as labetalol.  I am going to have him stop Toprol XL and increase labetalol to 200 mg bid.

## 2011-04-29 NOTE — Patient Instructions (Signed)
Your physician recommends that you schedule a follow-up appointment in: 6 MONTHS WITH DR [facility]  Your physician has recommended you make the following change in your medication: STOP TOPROL  INCREASE LABETALOL TO 200 MG TWICE DAILY  Your physician has requested that you have an echocardiogram. Echocardiography is a painless test that uses sound waves to create images of your heart. It provides your doctor with information about the size and shape of your heart and how well your heart's chambers and valves are working. This procedure takes approximately one hour. There are no restrictions for this procedure.  EVAL LV FUNCTION  Your physician has recommended that you have a sleep study. This test records several body functions during sleep, including: brain activity, eye movement, oxygen and carbon dioxide blood levels, heart rate and rhythm, breathing rate and rhythm, the flow of air through your mouth and nose, snoring, body muscle movements, and chest and belly movement. 1 MONTH

## 2011-04-29 NOTE — Assessment & Plan Note (Signed)
No ischemic symptoms.  Prior anginal equivalent has been dyspnea and fatigue with exertion.  He has had none of these symptoms since PCI in 12/10.  Continue beta blocker, ASA, ARB, statin.  He is no longer taking Plavix.  No stress testing is necessary prior to CEA. I will get an echo to assess LV systolic function as this has not been done recently.
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
## 2011-05-01 NOTE — Progress Notes (Addendum)
[facility] [place]  Established Carotid Patient  History of Present Illness  Matthew Bates is a 65 y.o. male who presents with chief complaint: LICA stenosis > 80%.  B carotid duplex (04/21/11) demonstrated: RICA 60-79% stenosis, LICA 80-99% stenosis.  Patient has no history of TIA or stroke symptom.  The patient has never  had amaurosis fugax or monocular blindness.  The patient has never had facial drooping or hemiplegia.  The patient has never had receptive or expressive aphasia.  Atherosclerotic risk factors include: DM, HTN, HL  Past Medical History  Diagnosis Date  . Carotid artery bruit   . DM (diabetes mellitus)   . CAD (coronary artery disease)   . HTN (hypertension)   . Hyperlipemia   . GERD (gastroesophageal reflux disease)   . Gout   . Anemia     Past Surgical History  Procedure Date  . Angioplasty   . Appendectomy   . Cataract extraction     History   Social History  . Marital Status: Married    Spouse Name: N/A    Number of Children: N/A  . Years of Education: N/A   Occupational History  . Not on file.   Social History Main Topics  . Smoking status: Former Smoker    Types: Cigarettes    Quit date: 11/04/1970  . Smokeless tobacco: Never Used  . Alcohol Use: Yes     rare  . Drug Use: No  . Sexually Active: Not on file   Other Topics Concern  . Not on file   Social History Narrative  . No narrative on file    Family History  Problem Relation Age of Onset  . Coronary artery disease      Current outpatient prescriptions:amLODipine (NORVASC) 5 MG tablet, Take 5 mg by mouth 2 (two) times daily. , Disp: , Rfl: ;  aspirin 325 MG tablet, Take 325 mg by mouth daily.  , Disp: , Rfl: ;  fenofibrate 160 MG tablet, Take 160 mg by mouth daily. , Disp: , Rfl: ;  folic acid (FOLVITE) 1 MG tablet, Take 1 mg by mouth daily.  , Disp: , Rfl: ;  furosemide (LASIX) 40 MG tablet, 20 mg. , Disp: , Rfl:  labetalol (NORMODYNE) 200 MG tablet, Take  1 tablet (200 mg total) by mouth 2 (two) times daily., Disp: 60 tablet, Rfl: 11;  losartan (COZAAR) 100 MG tablet, Take 100 mg by mouth daily. , Disp: , Rfl: ;  metFORMIN (GLUCOPHAGE) 1000 MG tablet, Take 1,000 mg by mouth 2 (two) times daily with a meal. , Disp: , Rfl: ;  Omega-3 Fatty Acids (FISH OIL CONCENTRATE PO), 1 cap bid, Disp: , Rfl:  simvastatin (ZOCOR) 40 MG tablet, Take 40 mg by mouth at bedtime.  , Disp: , Rfl: ;  labetalol (NORMODYNE) 100 MG tablet, , Disp: , Rfl: ;  metoprolol succinate (TOPROL-XL) 25 MG 24 hr tablet, , Disp: , Rfl: ;  NEVANAC 0.1 % ophthalmic suspension, , Disp: , Rfl: ;  prednisoLONE acetate (PRED FORTE) 1 % ophthalmic suspension, , Disp: , Rfl: ;  VIGAMOX 0.5 % ophthalmic solution, , Disp: , Rfl:   Allergies as of 05/03/2011 - Review Complete 04/29/2011  Allergen Reaction Noted  . Reglan  04/26/2011    Review of Systems (Positive items in bold and italic, otherwise negative)  General: Weight loss, Weight gain, Loss of appetite, Fever  Neurologic: Dizziness, Blackouts, Headaches, Seizure  Ear/Nose/Throat: Change in eyesight, Change in hearing,  Nose bleeds, Sore throat  Vascular: Pain in legs with walking, Pain in feet while lying flat, Non-healing ulcer, Stroke, "Mini stroke", Slurred speech, Temporary blindness, Blood clot in vein, Phlebitis  Pulmonary: Home oxygen, Productive cough, Bronchitis, Coughing up blood, Asthma, Wheezing  Musculoskeletal: Arthritis, Joint pain, Muscle pain  Cardiac: Chest pain, Chest tightness/pressure, Shortness of breath with exertion, Palpitations, Heart murmur, Arrythmia, Atrial fibrillation  Hematologic: Bleeding problems, Clotting disorder, Anemia  Psychiatric:  Depression, Anxiety, Attention deficit disorder  Gastrointestinal:  Black stool, Blood in stool, Peptic ulcer disease, Reflux, Hiatal hernia, Trouble swallowing, Diarrhea, Constipation  Urinary:  Kidney disease, Burning with urination, Frequent urination,  Difficulty urinating  Skin: Ulcers, Rashes  Physical Examination  Filed Vitals:   05/03/11 1451  BP: 147/86  Pulse: 85   General: A&O x 3, WDWN  Head: [place]/AT  Ear/Nose/Throat: Hearing grossly intact, nares w/o erythema or drainage, oropharynx w/o Erythema/Exudate  Eyes: PERRLA, EOMI  Neck: Supple, no nuchal rigidity, no palpable LAD  Pulmonary: Sym exp, good air movt, CTAB, no rales, rhonchi, & wheezing  Cardiac: RRR, Nl S1, S2, no Murmurs, rubs or gallops  Vascular: Vessel Right Left  Radial Palpable Palpable  Ulnary Palpable Palpable  Brachial Palpable Palpable  Carotid Palpable, without bruit Palpable, without bruit  Aorta Non-palpable N/A  Femoral Palpable Palpable  Popliteal Non-palpable Non-palpable  PT Palpable Palpable  DP Palpable Palpable   Gastrointestinal: soft, NTND, -G/R, - HSM, - masses, - CVAT B  Musculoskeletal: M/S 5/5 throughout, Extremities without ischemic changes  Neurologic: CN 2-12 intact, Pain and light touch intact in extremities, Motor exam as listed above  Psychiatric: Judgment intact, Mood & affect appropriate for pt's clinical situation  Dermatologic: See M/S exam for extremity exam, no rashes otherwise noted  Lymph : No Cervical, Axillary, or Inguinal lymphadenopathy   Non-Invasive Vascular Imaging  CAROTID DUPLEX (Date: 04/21/11):   R ICA stenosis: 60-79%  R VA: patent and antegrade  L ICA stenosis: 80-99%  L VA:  patent and antegrade  Outside Studies/Documentation 4 pages of outside documents were reviewed including: cardiac clearance.  Medical Decision Making  Matthew Bates is a 65 y.o. male who presents with: Asx L ICA stenosis > 80%   Based on the patient's vascular studies and examination, I have offered the patient: L CEA. Dr. Shirlee Latch has cleared the patient to proceed with surgery. I discussed with the patient the risks, benefits, and alternatives to carotid endarterectomy.  The patient is a candidate for  carotid artery stenting, but due to its increased stroke risk, I did not recommend the procedure. I discussed the procedural details of carotid endarterectomy with the patient. The patient is aware that the risks of carotid endarterectomy include but are not limited to: bleeding, infection, stroke, myocardial infarction, death, cranial nerve injuries both temporary and permanent, neck hematoma, possible airway compromise, labile blood pressure post-operatively, and cerebral hyperperfusion syndrome.  I discussed in depth with the patient the nature of atherosclerosis, and emphasized the importance of maximal medical management including strict control of blood pressure, blood glucose, and lipid levels, obtaining regular exercise, and cessation of smoking.  The patient is aware that without maximal medical management the underlying atherosclerotic disease process will progress, limiting the benefit of any interventions.  The patient is contemplate proceeding with this operation.  Thank you for allowing [place] to participate in this patient's care.  Leonides Sake, MD [facility] [place] Office: [phone] Pager: [phone]

## 2011-05-03 ENCOUNTER — Encounter: Payer: Self-pay | Admitting: Vascular Surgery

## 2011-05-03 ENCOUNTER — Ambulatory Visit (INDEPENDENT_AMBULATORY_CARE_PROVIDER_SITE_OTHER): Payer: Medicare Other | Admitting: Vascular Surgery

## 2011-05-03 VITALS — BP 147/86 | HR 85

## 2011-05-03 DIAGNOSIS — I6529 Occlusion and stenosis of unspecified carotid artery: Secondary | ICD-10-CM

## 2011-05-03 NOTE — Progress Notes (Signed)
F/u carotid. Has seen Dr Shirlee Latch for cardiac clearance

## 2011-05-21 ENCOUNTER — Ambulatory Visit (HOSPITAL_COMMUNITY)
Admission: RE | Admit: 2011-05-21 | Discharge: 2011-05-21 | Disposition: A | Discharge status: Home / Self Care | Payer: Medicare Other | Source: Ambulatory Visit | Attending: Vascular Surgery | Admitting: Vascular Surgery

## 2011-05-21 ENCOUNTER — Encounter (HOSPITAL_COMMUNITY)
Admission: RE | Admit: 2011-05-21 | Discharge: 2011-05-21 | Disposition: A | Discharge status: Home / Self Care | Payer: Medicare Other | Source: Ambulatory Visit | Attending: Vascular Surgery | Admitting: Vascular Surgery

## 2011-05-21 ENCOUNTER — Other Ambulatory Visit: Payer: Self-pay | Admitting: Vascular Surgery

## 2011-05-21 DIAGNOSIS — Z01818 Encounter for other preprocedural examination: Secondary | ICD-10-CM | POA: Insufficient documentation

## 2011-05-21 DIAGNOSIS — Z01810 Encounter for preprocedural cardiovascular examination: Secondary | ICD-10-CM | POA: Insufficient documentation

## 2011-05-21 DIAGNOSIS — Z01812 Encounter for preprocedural laboratory examination: Secondary | ICD-10-CM | POA: Insufficient documentation

## 2011-05-21 DIAGNOSIS — I6529 Occlusion and stenosis of unspecified carotid artery: Secondary | ICD-10-CM | POA: Insufficient documentation

## 2011-05-21 DIAGNOSIS — I6522 Occlusion and stenosis of left carotid artery: Secondary | ICD-10-CM

## 2011-05-21 LAB — COMPREHENSIVE METABOLIC PANEL
ALT: 24 U/L (ref 0–53)
Alkaline Phosphatase: 45 U/L (ref 39–117)
CO2: 26 mEq/L (ref 19–32)
Chloride: 100 mEq/L (ref 96–112)
GFR calc Af Amer: >60 mL/min (ref >60)
GFR calc non Af Amer: 50 mL/min — ABNORMAL LOW (ref >60)
Glucose, Bld: 209 mg/dL — ABNORMAL HIGH (ref 70–99)
Potassium: 4.2 mEq/L (ref 3.5–5.1)
Sodium: 137 mEq/L (ref 135–145)
Total Bilirubin: 0.4 mg/dL (ref 0.3–1.2)
Total Protein: 7.4 g/dL (ref 6.0–8.3)

## 2011-05-21 LAB — URINALYSIS, ROUTINE W REFLEX MICROSCOPIC
Bilirubin Urine: NEGATIVE
Hgb urine dipstick: NEGATIVE
Nitrite: NEGATIVE
Specific Gravity, Urine: 1.012 (ref 1.005–1.030)
Urobilinogen, UA: 1 mg/dL (ref 0.0–1.0)
pH: 6.5 (ref 5.0–8.0)

## 2011-05-21 LAB — SURGICAL PCR SCREEN: MRSA, PCR: NEGATIVE

## 2011-05-21 LAB — CBC
HCT: 39 % (ref 39.0–52.0)
Hemoglobin: 13.1 g/dL (ref 13.0–17.0)
MCV: 82.5 fL (ref 78.0–100.0)
RBC: 4.73 MIL/uL (ref 4.22–5.81)
WBC: 8 10*3/uL (ref 4.0–10.5)

## 2011-05-21 LAB — TYPE AND SCREEN: Antibody Screen: NEGATIVE

## 2011-05-21 LAB — URINE MICROSCOPIC-ADD ON

## 2011-05-22 ENCOUNTER — Inpatient Hospital Stay (HOSPITAL_COMMUNITY)
Admission: RE | Admit: 2011-05-22 | Discharge: 2011-05-23 | DRG: 039 | Disposition: A | Discharge status: Home / Self Care | Payer: Medicare Other | Source: Ambulatory Visit | Attending: Vascular Surgery | Admitting: Vascular Surgery

## 2011-05-22 ENCOUNTER — Other Ambulatory Visit: Payer: Self-pay | Admitting: Vascular Surgery

## 2011-05-22 ENCOUNTER — Encounter (HOSPITAL_BASED_OUTPATIENT_CLINIC_OR_DEPARTMENT_OTHER): Payer: Medicare Other

## 2011-05-22 DIAGNOSIS — I6529 Occlusion and stenosis of unspecified carotid artery: Principal | ICD-10-CM | POA: Diagnosis present

## 2011-05-22 DIAGNOSIS — Z7982 Long term (current) use of aspirin: Secondary | ICD-10-CM

## 2011-05-22 DIAGNOSIS — Z01812 Encounter for preprocedural laboratory examination: Secondary | ICD-10-CM

## 2011-05-22 HISTORY — PX: CAROTID ENDARTERECTOMY: SUR193

## 2011-05-22 LAB — GLUCOSE, CAPILLARY
Glucose-Capillary: 159 mg/dL — ABNORMAL HIGH (ref 70–99)
Glucose-Capillary: 172 mg/dL — ABNORMAL HIGH (ref 70–99)

## 2011-05-23 LAB — CBC
Hemoglobin: 11.9 g/dL — ABNORMAL LOW (ref 13.0–17.0)
MCHC: 33 g/dL (ref 30.0–36.0)
RDW: 14.3 % (ref 11.5–15.5)

## 2011-05-23 LAB — BASIC METABOLIC PANEL
GFR calc Af Amer: >60 mL/min (ref >60)
GFR calc non Af Amer: >60 mL/min (ref >60)
Glucose, Bld: 127 mg/dL — ABNORMAL HIGH (ref 70–99)
Potassium: 3.7 mEq/L (ref 3.5–5.1)
Sodium: 138 mEq/L (ref 135–145)

## 2011-05-23 LAB — GLUCOSE, CAPILLARY

## 2011-05-23 NOTE — Discharge Summary (Addendum)
NAMEMERDITH, Matthew NO.:  [id number]  MEDICAL RECORD NO.:  [id number]  LOCATION:  3305                         FACILITY:  [facility]  PHYSICIAN:  Fransisco Hertz, MD       DATE OF BIRTH:  11/02/1946  DATE OF ADMISSION:  05/22/2011 DATE OF DISCHARGE:  05/23/2011                              DISCHARGE SUMMARY   CHIEF COMPLAINT:  Asymptomatic severe left carotid stenosis.  HISTORY OF PRESENT ILLNESS:  Mr. Matthew Bates is a 65 year old gentleman, who had been following for carotid artery stenosis.  He was recently seen in our office with a carotid duplex scan on April 19, 2011 for his 1-year followup where the ICA peak systolic velocity increased to 357 and the end-diastolic velocity was 122 on the left.  On the right, it was 202/57.  This brought the left internal carotid artery stenosis in the 80-99% range and both sides showed significant increase since December 2011.  The patient denied any symptoms of TIA or stroke, amaurosis fugax, monocular blindness, speech difficulties, or weakness on one side or the other.  The patient was admitted for an elective left carotid endarterectomy.  PAST MEDICAL HISTORY: 1. Diabetes. 2. Hypertension. 3. Hyperlipidemia. 4. Coronary artery disease. 5. Obesity. 6. Gastroesophageal reflux disease. 7. Gout. 8. History of appendectomy. 9. History of trauma to bowels and kidney. 10.History of cataracts. 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 12.Coronary artery disease status post coronary artery bypass grafting     and multiple coronary angioplasties. 13.He had a right knee surgery. 14.Right cataract surgery. 15.Exploratory lap for which he had portion of small bowel removed     secondary to trauma.  HOSPITAL COURSE:  The patient was taken to the operating room on May 22, 2011 for a left carotid endarterectomy with bovine patch angioplasty and was found to have some thrombus and the plaque with a very  tight stenosis.  Postoperatively, the patient did well.  He was alert and oriented x3.  He had good and equal strength bilateral upper and lower extremities.  He had no tongue deviation or facial droop.  He was voiding, ambulating, and taking p.o. without difficulty and he was discharged to home and his left neck wound was soft and healing well.  FINAL DIAGNOSIS:  Asymptomatic critical left carotid stenosis, status post left carotid endarterectomy.  All of his other medical issues were stable while in hospital, and there were no changes made to his medications.  His O2 sats were 91% on room air and he was encouraged to use his incentive spirometer.  DISPOSITION:  The patient will be discharged to home.  He will follow up in 2 weeks with Dr. Imogene Burn and will follow his right carotid stenosis as well as his post surgical left carotid endarterectomy with serial duplex scans.  DISCHARGE MEDICATIONS:  Include: 1. Percocet 1-2 tablets every 4 hours as needed for pain, #20 were     given. 2. Amlodipine 5 mg twice daily. 3. Aspirin 325 mg daily. 4. Fish oil 1200 mg twice daily. 5. Folic acid 0.4 mg daily. 6. Lasix 20 mg daily. 7. Fenofibrate 160 mg daily. 8. Labetalol 200  mg twice daily. 9. Losartan 100 mg daily. 10.Metformin 1000 mg twice daily. 11.Multivitamins daily. 12.Prevacid 15 mg daily. 13.Simvastatin 40 mg daily.     Della Goo, PA-C   ______________________________ Fransisco Hertz, MD    RR/MEDQ  D:  05/23/2011  T:  05/23/2011  Job:  [id number]  Electronically Signed by Della Goo PA on 05/23/2011 05:10:00 PM Electronically Signed by Leonides Sake MD on 05/24/2011 04:47:40 PM

## 2011-05-24 ENCOUNTER — Telehealth: Payer: Self-pay | Admitting: Cardiology

## 2011-05-24 NOTE — Telephone Encounter (Signed)
Pt needs refill furosimide 40 mg, uses [facility]

## 2011-05-24 NOTE — Op Note (Signed)
NAMEUSMAN, Matthew Bates NO.:  [id number]  MEDICAL RECORD NO.:  [id number]  LOCATION:  3305                         FACILITY:  [facility]  PHYSICIAN:  Fransisco Hertz, MD       DATE OF BIRTH:  29-May-1946  DATE OF PROCEDURE:  05/22/2011 DATE OF DISCHARGE:                              OPERATIVE REPORT   PROCEDURES: 1. Left carotid endarterectomy with bovine patch angioplasty. 2. Intraoperative carotid duplex ultrasound.  PREOPERATIVE DIAGNOSIS:  Asymptomatic left internal carotid artery stenosis greater than 80%.  POSTOPERATIVE DIAGNOSIS:  Asymptomatic left internal carotid artery stenosis greater than 80%.  SURGEON:  Fransisco Hertz, MD  ASSISTANT:  Della Goo, PA-C  ANESTHESIA:  General.  FINDINGS IN THIS CASE: 1. Friable carotid plaque. 2. There were no residual intimal flaps found on completion carotid     duplex either in the longitudinal or transverse views.  SPECIMEN:  Carotid plaque.  DISPOSITION:  The specimen was to pathology.  ESTIMATED BLOOD LOSS:  100 mL.  INDICATIONS:  This is a 65 year old gentleman with asymptomatic left internal carotid artery disease greater than 80%.  This was found on routine surveillance.  His stenosis  had been increasing  slowly and finally presented with a asymptomatic greater than 80% stenosis.  We discussed the risks of this procedure which includes bleeding, infection, stroke, myocardial infarction, and possible death to either of these, possible cranial nerve injury, possible neck hematoma, and resultant airway compromise and need for additional procedures in the future.  He is aware of this and agreed to proceed forward.  DESCRIPTION OF OPERATION:  After full informed written consent was obtained from the patient, he was brought back to the operating room, placed supine upon the operating table.  He has been given IV antibiotics prior to induction.  After obtaining adequate anesthesia, he was then prepped  and draped in the standard fashion for a left carotid endarterectomy, placing him in semi-Fowler position with the neck extended, shoulder roll in place and the neck turned slightly to the right to open up his neck space.  I turned my attention first to identifying his sternal notch and his angle of mandible using these landmarks, then I palpated out the sternocleidomastoid muscle, and made an incision anterior to this and then dissected down to the platysmas muscle.  This was opened along the entire length of this incision, and dissected then down to the sternocleidomastoid.  This was dissected posteriorly, giving me access  to the internal jugular vein.  This was also dissected posteriorly.   This gave me access to the common carotid artery.  The common carotid artery  was dissected out and then an umbilical tape was placed around the CCA and a  Rumel tourniquet was loosely applied.  I then continued this dissection  superiorly, in this process, I identified the facial vein, which turned out to  a whole complex of veins that bridged across the area of the bifurcation.   These were all ligated with 2-0 ties and then transected.  I then dissected  down to the area of the carotid bifurcation taking care to avoid any injury to  any nerve  structures in this process of this dissection.  We identified the hypoglossal nerve and the ansa cervicalis.  The plaque actually extended up onto the internal carotid artery.  Subsequently, I needed more distal exposure on internal carotid artery, so I was forced to take down a portion of the digastric posterior belly to gain additional mobility.  I was able to mobilize the hypoglossal nerve slightly more superiorly, which allowed me to get to a soft area of the internal carotid artery.  I was able then to dissect out the external carotid artery and then dissected out the superior thyroid artery, placing a 2-0 tie around this and also dissected out the  external carotid artery, placing a vessel loop around this.  I then dissected out the soft portion of this internal carotid artery, I was able to get a umbilical tape around it and loosely applied a Rumel tourniquet.  At this point, we prepared the 10 shunt,  tying a 2-0 tie in the middle and then also started soaking a bovine pericardial patch.  The patient's pressure was brought up to a  systolic of 140 and he was given a total of 7500 units of heparin intravenously to obtain therapeutic anticoagulation.  After waiting 3 minutes, then I first clamped the internal carotid artery, then the common carotid artery, then the external carotid artery.  I then made an arteriotomy in the common carotid artery extended this with Potts scissor through the plaque without any difficulties.  Eventually, I entered into a portion of the internal carotid artery that was relatively disease- free.  Note in transecting the plaque, a good amount of thrombus was released from this plaque.  I had to irrigate out this artery to remove all this debris to avoid any risk of embolization to the brain.  At this point, then I took the 10 shunt and I placed a first into the internal carotid artery.  There was excellent backbleeding from the internal carotid artery.  There was a good size match between the internal carotid artery and the shunt, then I inserted this shunt into the common carotid artery and brought down the Rumel tourniquet to maintain hemostasis.  At this point, we had successfully completed the shunt maneuver.  A total of only about 2 minutes of clamp time was experienced  at this time.  The flow in the shunt was verified with Doppler.  At this point, then I started my endarterectomy in the common carotid artery. I dissected this out with a [place].  Note that this plaque was quite densely adherent to the wall and occupied a good portion of the thickness of this wall, but it was evident with the  dissection that we were in the appropriate layer of this patient's artery.  This dissection was carried down proximally into the common carotid artery.  It was then transected at an adherent portion and then the dissection was carried circumferentially with a right angle and then I carried this dissection onto the other side.  This dissection was then carried up into the carotid bulb, then I everted a portion of the external carotid artery and extracted as much of the plaque as possible and then at this point, we carried the rest of the dissection into the internal carotid artery. It feathered out somewhat, but there was definitely some fragmented intimal flap still present in internal carotid artery. I passed off this plaque as a specimen, then turned my attention to the internal carotid artery.  There was  loose intimal flap that I resected a portion of which and then there was another portion that I did not feel  I could easily resect due to the angulation of the artery and thinness of the artery.  I had concerns that attempt to further endarterectomized this portion of artery may result in damage to the artery. Subsequently, I tacked the portion of the back wall of this internal carotid artery down with a number of 7-0 Prolene stitches placed in direction of flow to keep the intimal flap tacked down and then carried an endarterectomy through the rest of this internal carotid artery and by this portion of this case, there was no more loose debris in the internal carotid artery.  I tested this multiple times with pressurized dextran, nothing lifted and I felt that this was safely tacked down in the internal carotid artery.  At this point, I carried the dissection down through the rest of this artery removing intimal flaps and loose pieces of debris for next 30+ minutes, and carried the endarterectomy down to the common carotid artery, a fresh sharply resected portion of the plaque here  was densely adherent around the common carotid artery in a circumferential fashion.  I had tested this end of the artery with pressurized dextran, there was no more loose debris or intimal flap at this point, as everything was densely adherent.  One last time throughout the rest of this artery, there were some portions of the artery where it quite thinned out, but all the residual debris was densely adherent to the thin portions of the artery.  Subsequently, I did not feel that additional endarterectomy was going to be possible without damaging the artery.  So, this portion of the case ended, as I felt that there was no more extractable plaque from this wall.  At this point, then we fashioned a bovine pericardial patch to the geometry of this artery.  I then sewed this in place to the carotid arteries with two  running stitches of 6-0 Prolene sewing from the top and bottom, tying in  the middle.  Prior to completing his patch angioplasty, I extracted the shunt  first removing it from the internal carotid artery and clamping it, then  I removed it from the common carotid artery and clamped the common carotid  artery.  Then, I allowed the artery to backbled from all arteries.  There  was no debris or thrombus obtained.  I then filled up the anastomosis with  heparinized saline and then the patch angioplasty was completed in the usual fashion.  I released the clamp on the external carotid artery first and then I released it on the common carotid artery.  After waiting a few seconds, I released the clamp on internal carotid artery.  At this point, I gave the patient a total of 30 mg of protamine to reverse the anticoagulation.  Please note I had also just prior to sewing the bovine patch, given another 2000 of heparin intravenously to maintain anticoagulation.  I then placed thrombin and Gelfoam into the wound and then applied some gentle pressure to obtain some hemostasis.  I also at this  point interrogated the artery with a continuous Doppler and the waveforms in the common carotid artery, internal carotid artery and external carotid arteries were compatible with their expected waveforms. I then obtained a sterilely draped SonoSite and interrogated the common carotid artery, internal carotid artery, and external carotid artery on both longitudinal and transverse views.  I  did not identify any residual intimal flaps or debris on examining these arteries.  I then at this point, re-irrigated out the wound, removed the thrombin and Gelfoam and then treated a few areas of bleeding with electrocautery, and reapplied the thrombin and Gelfoam.  I waited a few more minutes and then removed the thrombin and Gelfoam, irrigating out the wound.  There was no more active bleeding at this point.  I also at this point injected a total of 20 mL of 1% lidocaine without epinephrine along the incisions to give the patient some additional pain relief postoperatively.  At this point, I reapproximated the platysmas muscle with a running stitch of 3-0 Vicryl. The skin was then reapproximated with a running subcuticular 4-0 Monocryl.  The skin was then cleaned, dried and reinforced with Dermabond.  The patient tolerated this procedure well.  Complications were none.  Condition was stable.     Fransisco Hertz, MD     BLC/MEDQ  D:  05/22/2011  T:  05/22/2011  Job:  [id number]  Electronically Signed by Leonides Sake MD on 05/24/2011 05:16:54 PM

## 2011-05-27 ENCOUNTER — Other Ambulatory Visit (HOSPITAL_COMMUNITY): Payer: Medicare Other

## 2011-05-27 ENCOUNTER — Other Ambulatory Visit (HOSPITAL_COMMUNITY): Payer: Medicare Other | Admitting: Radiology

## 2011-06-03 ENCOUNTER — Encounter: Payer: Self-pay | Admitting: Cardiology

## 2011-06-03 ENCOUNTER — Other Ambulatory Visit: Payer: Self-pay | Admitting: *Deleted

## 2011-06-05 NOTE — Progress Notes (Signed)
[facility] [place]  Postoperative Visit  History of Present Illness  Matthew Bates is a 65 y.o. male who presents for postoperative follow-up for: L CEA (Date: 05/22/11).  The patient's neck incision is healed.  The patient has had no stroke or TIA symptoms post-operatively.  Physical Examination  There were no vitals filed for this visit.  L Neck: Incision is healed Neuro: CN 2-12 are intact, Motor strength is 5/5 bilaterally, sensation is grossly intact  Medical Decision Making  Matthew Bates is a 65 y.o. male who presents s/p L CEA.  The patient's neck incision is healing with no stroke symptoms. I discussed in depth with the patient the nature of atherosclerosis, and emphasized the importance of maximal medical management including strict control of blood pressure, blood glucose, and lipid levels, obtaining regular exercise, and cessation of smoking.  The patient is aware that without maximal medical management the underlying atherosclerotic disease process will progress, limiting the benefit of any interventions. The patient's surveillance will included routine carotid duplex studies which will be completed in: 3 months, at which time the patient will be re-evaluated.   I emphasized the importance of routine surveillance of the carotid arteries as recurrence of stenosis is possible, especially with proper management of underlying atherosclerotic disease. The patient agrees to participate in their maximal medical care and routine surveillance.  Thank you for allowing [place] to participate in this patient's care.  Leonides Sake, MD [facility] [place] Office: [phone] Pager: [phone]

## 2011-06-07 ENCOUNTER — Ambulatory Visit (INDEPENDENT_AMBULATORY_CARE_PROVIDER_SITE_OTHER): Payer: Medicare Other | Admitting: Vascular Surgery

## 2011-06-07 ENCOUNTER — Encounter: Payer: Self-pay | Admitting: Vascular Surgery

## 2011-06-07 DIAGNOSIS — I6529 Occlusion and stenosis of unspecified carotid artery: Secondary | ICD-10-CM

## 2011-06-12 NOTE — Telephone Encounter (Signed)
I talked with pharmacy at [facility]. This was refilled for him 05/24/11.

## 2011-06-20 ENCOUNTER — Other Ambulatory Visit (HOSPITAL_COMMUNITY): Payer: Self-pay | Admitting: Radiology

## 2011-06-27 ENCOUNTER — Ambulatory Visit (HOSPITAL_COMMUNITY): Payer: Medicare Other | Attending: Cardiology | Admitting: Radiology

## 2011-06-27 DIAGNOSIS — E669 Obesity, unspecified: Secondary | ICD-10-CM | POA: Insufficient documentation

## 2011-06-27 DIAGNOSIS — I079 Rheumatic tricuspid valve disease, unspecified: Secondary | ICD-10-CM | POA: Insufficient documentation

## 2011-06-27 DIAGNOSIS — I10 Essential (primary) hypertension: Secondary | ICD-10-CM | POA: Insufficient documentation

## 2011-06-27 DIAGNOSIS — E785 Hyperlipidemia, unspecified: Secondary | ICD-10-CM | POA: Insufficient documentation

## 2011-06-27 DIAGNOSIS — I2510 Atherosclerotic heart disease of native coronary artery without angina pectoris: Secondary | ICD-10-CM | POA: Insufficient documentation

## 2011-07-01 ENCOUNTER — Ambulatory Visit (HOSPITAL_BASED_OUTPATIENT_CLINIC_OR_DEPARTMENT_OTHER): Payer: Medicare Other | Attending: Cardiology

## 2011-07-01 DIAGNOSIS — R5383 Other fatigue: Secondary | ICD-10-CM

## 2011-07-01 DIAGNOSIS — R259 Unspecified abnormal involuntary movements: Secondary | ICD-10-CM | POA: Insufficient documentation

## 2011-07-01 DIAGNOSIS — G4733 Obstructive sleep apnea (adult) (pediatric): Secondary | ICD-10-CM | POA: Insufficient documentation

## 2011-07-02 ENCOUNTER — Telehealth: Payer: Self-pay | Admitting: *Deleted

## 2011-07-02 NOTE — Telephone Encounter (Signed)
Patient returning Katina Dung RN call from yesterday. Echo results given.

## 2011-07-06 DIAGNOSIS — G4733 Obstructive sleep apnea (adult) (pediatric): Secondary | ICD-10-CM

## 2011-07-06 NOTE — Procedures (Signed)
NAMEHURIEL, Matthew Bates                  ACCOUNT NO.:  [id number]  MEDICAL RECORD NO.:  [id number]          PATIENT TYPE:  OUT  LOCATION:  SLEEP CENTER                 FACILITY:  [facility]  PHYSICIAN:  Joani Cosma D. Maple Hudson, MD, FCCP, FACPDATE OF BIRTH:  06/17/45  DATE OF STUDY:  07/01/2011                           NOCTURNAL POLYSOMNOGRAM  REFERRING PHYSICIAN:  Marca Ancona, MD  REFERRING PHYSICIAN:  Marca Ancona, MD.  INDICATION FOR STUDY:  Hypersomnia with sleep apnea.  EPWORTH SLEEPINESS SCORE:  6/24.  BMI 29.2.  Weight 198 pounds.  Height 69 inches.  Neck 17.5 inches.  MEDICATIONS:  Home medications are charted and reviewed.  SLEEP ARCHITECTURE:  Total sleep time 245.5 minutes with sleep efficiency 68%.  Stage I was 10.6%, stage II 71.1%, stage III 3.5%, REM 14.9% of total sleep time.  Sleep latency 16.5 minutes.  REM latency 55.5 minutes.  Awake after sleep onset 54 minutes.  Arousal index 13.7.  BEDTIME MEDICATION:  None.  RESPIRATORY DATA:  Apnea-hypopnea index (AHI) 6.8 per hour.  A total of 28 events was scored all as hypopneas.  Events were non-positional, but associated particularly with REM.  REM AHI 32.9 per hour.  RDI 9.3 per hour.  There were insufficient numbers of events to permit application of split protocol CPAP titration on this study night.  OXYGEN DATA:  Moderate snoring with oxygen desaturation to a nadir of 85% and a mean oxygen saturation through the study of 92.4% on room air. A total of 9 minutes was recorded with oxygen saturation less than 90% and 1.3 minutes recorded with oxygen saturation less than 88% on room air through the total recording time.  CARDIAC DATA:  Sinus rhythm with PVCs.  MOVEMENT-PARASOMNIA:  Periodic limb movement with a total of 98 limb jerks counted of which 10 were associated with arousal or awakening for periodic limb movement with arousal index of 2.4 per hour.  Bathroom x2.  IMPRESSIONS-RECOMMENDATIONS: 1. Unremarkable sleep  architecture for sleep center environment     without bedtime medication. 2. Mild obstructive sleep apnea/hypopnea syndrome, AHI 6.8 per hour     (normal adult range is from 0-5 events per hour).  All events were     hypopneas, associated particularly with REM (REM AHI 32.9 per     hour), but not positional.  Moderate snoring with oxygen     desaturation to a nadir of 85% and a mean oxygen saturation through     the study of 92.4% on room air.  1.3 minutes was recorded with     oxygen saturation less than 88% on room air and 9 minutes recorded     with oxygen saturation less than 90% on room air through the total     recording time. 3. He had insufficient numbers of events to meet requirements for     application of split protocol CPAP titration on this study night.     Scores in this range might best be addressed conservatively with     weight loss and encouragement to sleep off flat of back as well as     treatment for any significant nasal congestion or obstruction. 4. Very mild  periodic limb movement with arousal.  Total of 98 limb     jerks was associated with 10 leg jerks causing sleep disturbance     for a periodic limb movement with arousal index of 2.4 per hour.     This is of doubtful clinical significance unless history suggests a     more active sleep disturbance from this pattern in the home     environment.     Raksha Wolfgang D. Maple Hudson, MD, [facility], FACP Diplomate, Biomedical engineer of Sleep Medicine Electronically Signed    CDY/MEDQ  D:  07/06/2011 11:24:54  T:  07/06/2011 11:35:56  Job:  [id number]

## 2011-07-19 ENCOUNTER — Telehealth: Payer: Self-pay | Admitting: *Deleted

## 2011-07-19 NOTE — Telephone Encounter (Signed)
Dr Shirlee Latch reviewed sleep study done 07/01/11. The sleep study showed mild OSA-he recommended weight loss. I discussed with pt. Original report signed by Dr Shirlee Latch to HIM.

## 2011-08-09 ENCOUNTER — Encounter: Payer: Self-pay | Admitting: Vascular Surgery

## 2011-09-18 ENCOUNTER — Other Ambulatory Visit: Payer: Self-pay | Admitting: Cardiology

## 2011-09-19 ENCOUNTER — Encounter: Payer: Self-pay | Admitting: Vascular Surgery

## 2011-09-20 ENCOUNTER — Encounter: Payer: Self-pay | Admitting: Vascular Surgery

## 2011-09-20 ENCOUNTER — Other Ambulatory Visit (INDEPENDENT_AMBULATORY_CARE_PROVIDER_SITE_OTHER): Payer: Medicare Other | Admitting: Vascular Surgery

## 2011-09-20 ENCOUNTER — Ambulatory Visit (INDEPENDENT_AMBULATORY_CARE_PROVIDER_SITE_OTHER): Payer: Medicare Other | Admitting: Vascular Surgery

## 2011-09-20 VITALS — BP 151/93 | HR 80 | Resp 16 | Ht 69.0 in | Wt 210.0 lb

## 2011-09-20 DIAGNOSIS — I6529 Occlusion and stenosis of unspecified carotid artery: Secondary | ICD-10-CM

## 2011-09-20 DIAGNOSIS — Z48812 Encounter for surgical aftercare following surgery on the circulatory system: Secondary | ICD-10-CM

## 2011-09-20 DIAGNOSIS — I6523 Occlusion and stenosis of bilateral carotid arteries: Secondary | ICD-10-CM

## 2011-09-20 NOTE — Progress Notes (Signed)
[facility] [place]  Established Carotid Patient  History of Present Illness  Matthew Bates is a 65 y.o. male who presents with chief complaint: routine follow up.  Pt is s/p L CEA (05/22/11) for high grade L ICA stenosis.  Patient has no history of TIA or stroke symptom.  The patient has never had amaurosis fugax or monocular blindness.  The patient has never had facial drooping or hemiplegia.  The patient has never had receptive or expressive aphasia.    Past Medical History, Past Surgical History, Social History, Family History, Medications, Allergies, and Review of Systems are unchanged from previous evaluation on 06/07/11.  Physical Examination  Filed Vitals:   09/20/11 0958 09/20/11 0959  BP: 146/91 151/93  Pulse: 81 80  Resp: 16   Height: 5\' 9"  (1.753 m)   Weight: 210 lb (95.255 kg)   SpO2: 97%    Body mass index is 31.01 kg/(m^2).   General: A&O x 3, WDWN  Eyes: PERRLA, EOMI  Neck: left neck incision well healed  Pulmonary: Sym exp, good air movt, CTAB, no rales, rhonchi, & wheezing  Cardiac: RRR, Nl S1, S2, no Murmurs, rubs or gallops  Vascular: Vessel Right Left  Radial Palpable Palpable  Brachial Palpable Palpable  Carotid Palpable, without bruit Palpable, without bruit  Aorta Non-palpable N/A  Femoral Palpable Palpable  Popliteal Non-palpable Non-palpable  PT Palpable Palpable  DP Palpable Palpable   Gastrointestinal: soft, NTND, -G/R, - HSM, - masses, - CVAT B  Musculoskeletal: M/S 5/5 throughout , Extremities without ischemic changes   Neurologic: CN 2-12 intact , Pain and light touch intact in extremities , Motor exam as listed above  Non-Invasive Vascular Imaging  CAROTID DUPLEX (Date: 09/20/11):   R ICA stenosis: 40-59%  R VA: to-fro and  patent  L ICA stenosis: widely patent  L VA: patent and antegrade  Medical Decision Making  [name] is a 65 y.o. male who presents with: B ICA stenosis.   Based on the  patient's vascular studies and examination, I have offered the patient: continued surveillance on an annual basis.  His right side is <60% as is his left side now, so a q12 month interval is appropriate.  He will start on a carotid surveillance protocol to track any progression.  I discussed in depth with the patient the nature of atherosclerosis, and emphasized the importance of maximal medical management including strict control of blood pressure, blood glucose, and lipid levels, obtaining regular exercise, and cessation of smoking.  The patient is aware that without maximal medical management the underlying atherosclerotic disease process will progress, limiting the benefit of any interventions.  Thank you for allowing [place] to participate in this patient's care.  Leonides Sake, MD [facility] [place] Office: [phone] Pager: [phone]  09/20/2011, 10:43 AM

## 2011-10-03 NOTE — Procedures (Unsigned)
CAROTID DUPLEX EXAM  INDICATION:  Followup carotid stenosis  HISTORY: Diabetes:  Yes Cardiac:  CAD, CABG 03/18/2009 Hypertension:  Yes Smoking:  Previous Previous Surgery:  Left carotid endarterectomy on 05/22/2011 CV History:  Asymptomatic Amaurosis Fugax No, Paresthesias No, Hemiparesis No                                      RIGHT             LEFT Brachial systolic pressure:         148               168 Brachial Doppler waveforms:         WNL               WNL Vertebral direction of flow:        To-fro            Antegrade DUPLEX VELOCITIES (cm/sec) CCA peak systolic                   82                84 ECA peak systolic                   97                170 ICA peak systolic                   173               92 ICA end diastolic                   51                26 PLAQUE MORPHOLOGY:                  Calcified         Not visualized PLAQUE AMOUNT:                      Moderate          Not visualized PLAQUE LOCATION:                    CCA/ICA           Not visualized  IMPRESSION: 1. Right internal carotid artery stenosis present in the 40%-59%     range, this is downgraded from previous study on 04/19/2011. 2. Left internal carotid artery is patent with history of     endarterectomy, no hyperplasia or hemodynamically significant     stenosis is identified. 3. Left external carotid artery stenosis is present. 4. 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  ___________________________________________ Fransisco Hertz, MD  SH/MEDQ  D:  09/20/2011  T:  09/20/2011  Job:  [id number]

## 2011-11-18 ENCOUNTER — Telehealth: Payer: Self-pay | Admitting: Cardiology

## 2011-11-18 NOTE — Telephone Encounter (Signed)
All Cardiac faxed to [facility] @ [phone] 11/18/11/KM

## 2011-12-04 ENCOUNTER — Other Ambulatory Visit: Payer: Self-pay | Admitting: *Deleted

## 2011-12-04 MED ORDER — FENOFIBRATE 160 MG PO TABS: 160.0000 mg | ORAL_TABLET | Freq: Every day | ORAL | Status: AC

## 2011-12-17 ENCOUNTER — Other Ambulatory Visit: Payer: Self-pay

## 2012-03-19 ENCOUNTER — Ambulatory Visit (INDEPENDENT_AMBULATORY_CARE_PROVIDER_SITE_OTHER): Payer: Medicare Other | Admitting: *Deleted

## 2012-03-19 DIAGNOSIS — Z87891 Personal history of nicotine dependence: Secondary | ICD-10-CM

## 2012-03-19 DIAGNOSIS — Z136 Encounter for screening for cardiovascular disorders: Secondary | ICD-10-CM

## 2012-03-19 DIAGNOSIS — Z0000 Encounter for general adult medical examination without abnormal findings: Secondary | ICD-10-CM

## 2012-04-20 ENCOUNTER — Other Ambulatory Visit (HOSPITAL_COMMUNITY): Payer: Self-pay

## 2012-04-20 ENCOUNTER — Telehealth (HOSPITAL_COMMUNITY): Payer: Self-pay

## 2012-04-20 DIAGNOSIS — I10 Essential (primary) hypertension: Secondary | ICD-10-CM

## 2012-04-20 DIAGNOSIS — I25810 Atherosclerosis of coronary artery bypass graft(s) without angina pectoris: Secondary | ICD-10-CM

## 2012-04-20 MED ORDER — LABETALOL HCL 200 MG PO TABS: 200.0000 mg | ORAL_TABLET | Freq: Two times a day (BID) | ORAL | Status: DC

## 2012-04-20 MED ORDER — AMLODIPINE BESYLATE 5 MG PO TABS: 5.0000 mg | ORAL_TABLET | Freq: Every day | ORAL | Status: DC

## 2012-04-20 NOTE — Telephone Encounter (Signed)
..   Requested Prescriptions   Signed Prescriptions Disp Refills  . amLODipine (NORVASC) 5 MG tablet 180 tablet 3    Sig: Take 1 tablet (5 mg total) by mouth daily.    Authorizing Provider: Laurey Morale    Ordering User: Dilon Lank M  . labetalol (NORMODYNE) 200 MG tablet 180 tablet 3    Sig: Take 1 tablet (200 mg total) by mouth 2 (two) times daily.    Authorizing Provider: Laurey Morale    Ordering User: Christella Hartigan, Kaio Kuhlman Judie Petit

## 2012-04-20 NOTE — Telephone Encounter (Signed)
Called patient  to ask him if  his labetalol was 100 mg or 200 mg. patient did not know will call back

## 2012-05-29 ENCOUNTER — Ambulatory Visit: Payer: Medicare Other | Admitting: Cardiology

## 2012-06-18 ENCOUNTER — Ambulatory Visit: Payer: Medicare Other | Admitting: Cardiovascular Disease

## 2012-07-01 ENCOUNTER — Telehealth: Payer: Self-pay | Admitting: Internal Medicine

## 2012-07-01 ENCOUNTER — Encounter: Payer: Self-pay | Admitting: Internal Medicine

## 2012-07-01 NOTE — Telephone Encounter (Signed)
Recall Project: no contact with patient, letter mailed °

## 2012-08-04 ENCOUNTER — Encounter: Payer: Self-pay | Admitting: Cardiovascular Disease

## 2012-08-04 ENCOUNTER — Ambulatory Visit (INDEPENDENT_AMBULATORY_CARE_PROVIDER_SITE_OTHER): Payer: Medicare Other | Admitting: Cardiovascular Disease

## 2012-08-04 VITALS — BP 144/76 | HR 78 | Ht 69.0 in | Wt 204.8 lb

## 2012-08-04 DIAGNOSIS — I6529 Occlusion and stenosis of unspecified carotid artery: Secondary | ICD-10-CM

## 2012-08-04 DIAGNOSIS — E785 Hyperlipidemia, unspecified: Secondary | ICD-10-CM

## 2012-08-04 DIAGNOSIS — I2510 Atherosclerotic heart disease of native coronary artery without angina pectoris: Secondary | ICD-10-CM

## 2012-08-04 DIAGNOSIS — I25810 Atherosclerosis of coronary artery bypass graft(s) without angina pectoris: Secondary | ICD-10-CM

## 2012-08-04 MED ORDER — CYANOCOBALAMIN 1000 MCG/ML IJ SOLN: 1000.0000 ug | INTRAMUSCULAR | Status: DC

## 2012-08-04 NOTE — Assessment & Plan Note (Signed)
Matthew Bates has a history of coronary artery disease and status post stenting as well is status post coronary artery bypass grafting in 2010. He's not had any symptoms. He exercises on occasion but has not had any episodes of chest pain. We'll continue the same medications. I have encouraged him to try to exercise on a regular basis. This will help him with his weight loss, diabetic control, and control of his hyperlipidemia.  We'll check fasting lipids on him in 2 months and I'll see him again in 6 months for followup office visit.

## 2012-08-04 NOTE — Assessment & Plan Note (Signed)
He has a right carotid bruit. He's been followed by Dr. Imogene Burn for this.  His right carotid stenosis is moderate and has been stable.

## 2012-08-04 NOTE — Patient Instructions (Addendum)
Your physician recommends that you return for lab work in: 2 months fasting labs  Your physician wants you to follow-up in: 6 months You will receive a reminder letter in the mail two months in advance. If you don't receive a letter, please call our office to schedule the follow-up appointment.  Your physician recommends that you continue on your current medications as directed. Please refer to the Current Medication list given to you today.

## 2012-08-04 NOTE — Progress Notes (Signed)
Matthew Bates Date of Birth  22-Feb-1946       [facility]    [place] [address]  [address] [place], [place]  [address]   [place], [place]  [address] [phone]     [phone]   Fax  [phone]    Fax [phone]  Problem List: 1. Coronary artery disease-status post multiple PCI's and CABG in May, 2010 2. Peripheral vascular disease- moderate carotid artery disease 3. Hyperlipidemia 4. Diabetes mellitus 5. History of Present Illness: 66 yo with history of CAD with multiple angioplasties culminating in CABG in 5/10. He developed weakness/fatigue in 12/10 and had repeat cath with occlusion of the SVG-PDA. At that time, he had Xience DES to the distal RCA and the proximal CFX. EF was preserved. Since then, he has done well symptomatically. 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 He has had dyspnea and fatigue as anginal equivalents in the past, never much chest pain. Currently, he denies exertional dyspnea or fatigue. He has had no chest pain. He is able to climb up a flight of steps with no problems. He had a complicated cataract removal recently and is still recovering from that, though eyesight has improved. Finally, he has been noted to have progressive LICA stenosis on carotid dopplers. He is scheduled to see Dr. Imogene Burn for evaluation for CEA. He has had no stroke-like symptoms.   [name] 1, 2013 - Matthew Bates is a 66 yo with the above noted medical history.   He has not been having any chest pain - although he never had any chest pain even when he needed CABG.  he informed me that he had an abdominal aortic ultrasound this morning at his medical doctors office. He had an abdominal aortic ultrasound in May of this year at Dr. Nicky Pugh office.  It was negative for abdominal aortic aneurysm.  He exercised regularly - 15 - 30 minutes a day on the treadmill.   Dr. Clelia Croft has been managing his  lipids.  His triglycerides are typically elevated.  HbA1C was 7 last week.   Current Outpatient Prescriptions on File Prior to Visit  Medication Sig Dispense Refill  . aspirin 325 MG tablet Take 325 mg by mouth daily.        . fenofibrate 160 MG tablet Take 1 tablet (160 mg total) by mouth daily.  30 tablet  5  . folic acid (FOLVITE) 1 MG tablet Take 1 mg by mouth daily.        . furosemide (LASIX) 40 MG tablet 20 mg.       . labetalol (NORMODYNE) 200 MG tablet Take 1 tablet (200 mg total) by mouth 2 (two) times daily.  180 tablet  3  . Lansoprazole (PREVACID PO) Take by mouth.        . losartan (COZAAR) 100 MG tablet Take 100 mg by mouth daily.       . MECLIZINE HCL PO Take by mouth as needed.        . metFORMIN (GLUCOPHAGE) 1000 MG tablet Take 1,000 mg by mouth 2 (two) times daily with a meal.       . metoprolol succinate (TOPROL-XL) 25 MG 24 hr tablet Take 50 mg by mouth daily.       . Multiple Vitamin (MULTIVITAMIN) capsule Take 1 capsule by mouth daily.        . Omega-3 Fatty Acids (  FISH OIL CONCENTRATE PO) 1 cap bid      . simvastatin (ZOCOR) 40 MG tablet Take 40 mg by mouth at bedtime.        Marland Kitchen DISCONTD: amLODipine (NORVASC) 5 MG tablet Take 1 tablet (5 mg total) by mouth daily.  180 tablet  3    Allergies  Allergen Reactions  . Metoclopramide Hcl     Increases HBP symptoms    Past Medical History  Diagnosis Date  . Carotid artery bruit   . DM (diabetes mellitus)   . CAD (coronary artery disease)   . HTN (hypertension)   . Hyperlipemia   . GERD (gastroesophageal reflux disease)   . Gout   . Anemia   . Obesity     Past Surgical History  Procedure Date  . Angioplasty   . Appendectomy   . Cataract extraction   . Carotid endarterectomy 05/22/11    Left CEA  . Knee surgery     right    History  Smoking status  . Former Smoker  . Types: Cigarettes  . Quit date: 11/04/1970  Smokeless tobacco  . Never Used    History  Alcohol Use  . Yes    rare     Family History  Problem Relation Age of Onset  . Coronary artery disease    . Heart disease Father   . Coronary artery disease Father     Reviw of Systems:  Reviewed in the HPI.  All other systems are negative.  Physical Exam: Blood pressure 144/76, pulse 78, height 5\' 9"  (1.753 m), weight 204 lb 12.8 oz (92.897 kg). General: Well developed, well nourished, in no acute distress.  Head: Normocephalic, atraumatic, sclera non-icteric, mucus membranes are moist,   Neck: Supple. Carotids are 2 + with a soft right carotid bruit. No JVD  Lungs: Clear bilaterally to auscultation.  Heart: regular rate.  normal  S1 S2. No murmurs,   Abdomen: Soft, non-tender, non-distended with normal bowel sounds. No hepatomegaly. He is mildly obese.  Msk:  Strength and tone are normal  Extremities: No clubbing or cyanosis. No edema.  Distal pedal pulses are 2+ and equal bilaterally.  Neuro: Alert and oriented X 3. Moves all extremities spontaneously.  Psych:  Responds to questions appropriately with a normal affect.  ECG: 08/04/2012-normal sinus rhythm at 70 beats a minute. He has nonspecific ST and T wave abnormalities in the inferior leads.  Assessment / Plan:

## 2012-08-07 ENCOUNTER — Encounter: Payer: Self-pay | Admitting: Cardiology

## 2012-09-18 ENCOUNTER — Other Ambulatory Visit: Payer: Self-pay | Admitting: *Deleted

## 2012-09-18 DIAGNOSIS — I6529 Occlusion and stenosis of unspecified carotid artery: Secondary | ICD-10-CM

## 2012-09-18 DIAGNOSIS — Z48812 Encounter for surgical aftercare following surgery on the circulatory system: Secondary | ICD-10-CM

## 2012-09-25 ENCOUNTER — Other Ambulatory Visit: Payer: Self-pay

## 2012-10-06 ENCOUNTER — Other Ambulatory Visit: Payer: Self-pay | Admitting: *Deleted

## 2012-10-06 DIAGNOSIS — I2510 Atherosclerotic heart disease of native coronary artery without angina pectoris: Secondary | ICD-10-CM

## 2012-10-06 DIAGNOSIS — E785 Hyperlipidemia, unspecified: Secondary | ICD-10-CM

## 2012-10-06 DIAGNOSIS — I10 Essential (primary) hypertension: Secondary | ICD-10-CM

## 2012-10-07 ENCOUNTER — Other Ambulatory Visit: Payer: Medicare Other

## 2012-10-13 ENCOUNTER — Encounter: Payer: Self-pay | Admitting: Neurosurgery

## 2012-10-14 ENCOUNTER — Ambulatory Visit (INDEPENDENT_AMBULATORY_CARE_PROVIDER_SITE_OTHER): Payer: Medicare Other | Admitting: Neurosurgery

## 2012-10-14 ENCOUNTER — Ambulatory Visit (INDEPENDENT_AMBULATORY_CARE_PROVIDER_SITE_OTHER): Payer: Medicare Other | Admitting: Vascular Surgery

## 2012-10-14 ENCOUNTER — Encounter: Payer: Self-pay | Admitting: Neurosurgery

## 2012-10-14 VITALS — BP 141/73 | HR 63 | Resp 16 | Ht 69.0 in | Wt 205.0 lb

## 2012-10-14 DIAGNOSIS — I6529 Occlusion and stenosis of unspecified carotid artery: Secondary | ICD-10-CM

## 2012-10-14 DIAGNOSIS — Z48812 Encounter for surgical aftercare following surgery on the circulatory system: Secondary | ICD-10-CM

## 2012-10-14 NOTE — Progress Notes (Signed)
[facility] [place] Carotid Office Note  CC: Carotid surveillance Referring Physician: Imogene Burn  History of Present Illness: 66 year old male patient of Dr. Imogene Burn who underwent a left CEA in July 2012. The patient denies signs or symptoms of CVA, TIA, amaurosis fugax or any neural deficit. The patient denies any new medical diagnoses or recent surgery.  Past Medical History  Diagnosis Date  . Carotid artery bruit   . DM (diabetes mellitus)   . CAD (coronary artery disease)   . HTN (hypertension)   . Hyperlipemia   . GERD (gastroesophageal reflux disease)   . Gout   . Anemia   . Obesity   . High triglycerides   . Carotid artery occlusion   . Chronic kidney disease     ROS: [x]  Positive   [ ]  Denies    General: [ ]  Weight loss, [ ]  Fever, [ ]  chills Neurologic: [ ]  Dizziness, [ ]  Blackouts, [ ]  Seizure [ ]  Stroke, [ ]  "Mini stroke", [ ]  Slurred speech, [ ]  Temporary blindness; [ ]  weakness in arms or legs, [ ]  Hoarseness Cardiac: [ ]  Chest pain/pressure, [ ]  Shortness of breath at rest [ ]  Shortness of breath with exertion, [ ]  Atrial fibrillation or irregular heartbeat Vascular: [ ]  Pain in legs with walking, [ ]  Pain in legs at rest, [ ]  Pain in legs at night,  [ ]  Non-healing ulcer, [ ]  Blood clot in vein/DVT,   Pulmonary: [ ]  Home oxygen, [ ]  Productive cough, [ ]  Coughing up blood, [ ]  Asthma,  [ ]  Wheezing Musculoskeletal:  [ ]  Arthritis, [ ]  Low back pain, [ ]  Joint pain Hematologic: [ ]  Easy Bruising, [ ]  Anemia; [ ]  Hepatitis Gastrointestinal: [ ]  Blood in stool, [ ]  Gastroesophageal Reflux/heartburn, [ ]  Trouble swallowing Urinary: [ ]  chronic Kidney disease, [ ]  on HD - [ ]  MWF or [ ]  TTHS, [ ]  Burning with urination, [ ]  Difficulty urinating Skin: [ ]  Rashes, [ ]  Wounds Psychological: [ ]  Anxiety, [ ]  Depression   Social History History  Substance Use Topics  . Smoking status: Former Smoker    Types: Cigarettes    Quit date: 11/04/1970  .  Smokeless tobacco: Never Used  . Alcohol Use: Yes     Comment: rare    Family History Family History  Problem Relation Age of Onset  . Coronary artery disease    . Heart disease Father   . Coronary artery disease Father   . Heart attack Father   . Diabetes Brother   . Heart disease Brother   . Hyperlipidemia Brother   . Hypertension Brother     Allergies  Allergen Reactions  . Metoclopramide Hcl     Increases HBP symptoms    Current Outpatient Prescriptions  Medication Sig Dispense Refill  . amLODipine (NORVASC) 5 MG tablet Take 5 mg by mouth 2 (two) times daily.      Marland Kitchen aspirin 325 MG tablet Take 325 mg by mouth daily.        . cyanocobalamin (,VITAMIN B-12,) 1000 MCG/ML injection Inject 1 mL (1,000 mcg total) into the muscle every 30 (thirty) days.  1 mL  0  . fenofibrate 160 MG tablet Take 1 tablet (160 mg total) by mouth daily.  30 tablet  5  . folic acid (FOLVITE) 1 MG tablet Take 1 mg by mouth daily.        . furosemide (LASIX) 40 MG tablet 20 mg.       Marland Kitchen  labetalol (NORMODYNE) 200 MG tablet Take 1 tablet (200 mg total) by mouth 2 (two) times daily.  180 tablet  3  . Lansoprazole (PREVACID PO) Take by mouth.        . losartan (COZAAR) 100 MG tablet Take 100 mg by mouth daily.       . MECLIZINE HCL PO Take by mouth as needed.        . metFORMIN (GLUCOPHAGE) 1000 MG tablet Take 1,000 mg by mouth 2 (two) times daily with a meal.       . metoprolol succinate (TOPROL-XL) 25 MG 24 hr tablet Take 50 mg by mouth daily.       . Multiple Vitamin (MULTIVITAMIN) capsule Take 1 capsule by mouth daily.        . Omega-3 Fatty Acids (FISH OIL CONCENTRATE PO) 1 cap bid      . simvastatin (ZOCOR) 40 MG tablet Take 40 mg by mouth at bedtime.          Physical Examination  Filed Vitals:   10/14/12 0956  BP: 141/73  Pulse: 63  Resp:     Body mass index is 30.27 kg/(m^2).  General:  WDWN in NAD Gait: Normal HEENT: WNL Eyes: Pupils equal Pulmonary: normal non-labored breathing  , without Rales, rhonchi,  wheezing Cardiac: RRR, without  Murmurs, rubs or gallops; Abdomen: soft, NT, no masses Skin: no rashes, ulcers noted  Vascular Exam Pulses: 3+ radial pulses bilaterally Carotid bruits: Mild right carotid bruit heard, left carotid pulse to auscultation Extremities without ischemic changes, no Gangrene , no cellulitis; no open wounds;  Musculoskeletal: no muscle wasting or atrophy   Neurologic: A&O X 3; Appropriate Affect ; SENSATION: normal; MOTOR FUNCTION:  moving all extremities equally. Speech is fluent/normal  Non-Invasive Vascular Imaging CAROTID DUPLEX 10/14/2012  Right ICA 60 - 79 % stenosis Left ICA 0 - 19% stenosis   ASSESSMENT/PLAN: Asymptomatic patient with a mild increase in right ICA stenosis. The patient will followup in 6 months with repeat carotid duplex. The patient's questions were encouraged and answered, he is in agreement with this plan. The patient knows the signs and symptoms of CVA and knows to call 911 should this occur.  Lauree Chandler ANP   Clinic MD: Edilia Bo

## 2012-10-14 NOTE — Progress Notes (Signed)
Carotid duplex performed @ [facility] 10/14/2012

## 2012-11-23 ENCOUNTER — Other Ambulatory Visit: Payer: Self-pay | Admitting: *Deleted

## 2012-11-23 DIAGNOSIS — I6529 Occlusion and stenosis of unspecified carotid artery: Secondary | ICD-10-CM

## 2013-03-31 ENCOUNTER — Other Ambulatory Visit: Payer: Self-pay | Admitting: Cardiology

## 2013-03-31 NOTE — Telephone Encounter (Signed)
Discontinued Medications  Reason for Discontinue    Clopidogrel Bisulfate (PLAVIX PO) Error    VIGAMOX 0.5 % ophthalmic solution Error    NEVANAC 0.1 % ophthalmic suspension Error    prednisoLONE acetate (PRED FORTE) 1 % ophthalmic suspension Error    amLODipine (NORVASC) 5 MG tablet     Follow-up and Disposition:   Return in about 6 months (around 02/02/2013).  Routing History Recorded       Follow-up and Disposition History Recorded  Patient Instructions  Your physician recommends that you return for lab work in: 2 months fasting labs Your physician wants you to follow-up in: 6 months You will receive a reminder letter in the mail two months in advance. If you don't receive a letter, please call our office to schedule the follow-up appointment. Your physician recommends that you continue on your current medications as directed. Please refer to the Current Medication list given to you today. Patient Instructions History Recorded Chart Reviewed By Antony Odea, RN  on 08/05/2012  4:29 PM  Previous Visit  Provider Department Encounter #  07/01/2012  9:23 AM Elyn Aquas., MD [facility] [id number]

## 2013-04-11 ENCOUNTER — Other Ambulatory Visit: Payer: Self-pay | Admitting: Cardiology

## 2013-04-12 NOTE — Telephone Encounter (Signed)
Fax Received. Refill Completed. Matthew Bates (R.M.A)   

## 2013-05-09 ENCOUNTER — Other Ambulatory Visit: Payer: Self-pay | Admitting: Cardiology

## 2013-05-19 ENCOUNTER — Other Ambulatory Visit: Payer: Self-pay | Admitting: Cardiology

## 2013-07-23 ENCOUNTER — Encounter: Payer: Self-pay | Admitting: Internal Medicine

## 2013-08-18 ENCOUNTER — Ambulatory Visit (AMBULATORY_SURGERY_CENTER): Payer: Self-pay | Admitting: *Deleted

## 2013-08-18 VITALS — Ht 69.0 in | Wt 209.0 lb

## 2013-08-18 DIAGNOSIS — Z86010 Personal history of colonic polyps: Secondary | ICD-10-CM

## 2013-08-18 MED ORDER — MOVIPREP 100 G PO SOLR: ORAL | Status: DC

## 2013-08-18 NOTE — Progress Notes (Signed)
Patient denies any allergies to eggs or soy. Patient denies any problems with anesthesia.  

## 2013-08-23 ENCOUNTER — Encounter: Payer: Self-pay | Admitting: Internal Medicine

## 2013-08-30 ENCOUNTER — Other Ambulatory Visit: Payer: Self-pay | Admitting: Cardiology

## 2013-09-09 ENCOUNTER — Encounter: Payer: Medicare Other | Admitting: Internal Medicine

## 2013-09-15 ENCOUNTER — Other Ambulatory Visit: Payer: Self-pay | Admitting: Neurosurgery

## 2013-09-15 DIAGNOSIS — I6529 Occlusion and stenosis of unspecified carotid artery: Secondary | ICD-10-CM

## 2013-09-15 DIAGNOSIS — Z48812 Encounter for surgical aftercare following surgery on the circulatory system: Secondary | ICD-10-CM

## 2013-09-16 ENCOUNTER — Encounter: Payer: Self-pay | Admitting: Cardiology

## 2013-09-27 ENCOUNTER — Ambulatory Visit: Payer: Medicare Other | Admitting: Cardiovascular Disease

## 2013-09-28 ENCOUNTER — Ambulatory Visit (AMBULATORY_SURGERY_CENTER): Payer: Medicare Other | Admitting: Internal Medicine

## 2013-09-28 ENCOUNTER — Encounter: Payer: Self-pay | Admitting: Internal Medicine

## 2013-09-28 VITALS — BP 148/66 | HR 59 | Temp 96.3°F | Resp 17 | Ht 69.0 in | Wt 209.0 lb

## 2013-09-28 DIAGNOSIS — Z86010 Personal history of colonic polyps: Secondary | ICD-10-CM

## 2013-09-28 DIAGNOSIS — D126 Benign neoplasm of colon, unspecified: Secondary | ICD-10-CM

## 2013-09-28 LAB — GLUCOSE, CAPILLARY: Glucose-Capillary: 134 mg/dL — ABNORMAL HIGH (ref 70–99)

## 2013-09-28 MED ORDER — SODIUM CHLORIDE 0.9 % IV SOLN: 500.0000 mL | INTRAVENOUS | Status: DC

## 2013-09-28 NOTE — Op Note (Signed)
[facility] [address] [place], [address]   COLONOSCOPY PROCEDURE REPORT  PATIENT: Matthew, Bates  MR#: [id number] BIRTHDATE: [birth year] , 67  yrs. old GENDER: Male ENDOSCOPIST: Roxy Cedar, MD REFERRED IO:[id number] Program Recall PROCEDURE DATE:  09/28/2013 PROCEDURE:   Colonoscopy with snare polypectomy x 3 First Screening Colonoscopy - Avg.  risk and is 50 yrs.  old or older - No.  Prior Negative Screening - Now for repeat screening. N/A  History of Adenoma - Now for follow-up colonoscopy & has been > or = to 3 yrs.  Yes hx of adenoma.  Has been 3 or more years since last colonoscopy.  Polyps Removed Today? Yes. ASA CLASS:   Class II INDICATIONS:Patient's personal history of adenomatous colon polyps. Index 06-2007 w/ Advanced adenoma(s) MEDICATIONS: MAC sedation, administered by CRNA and propofol (Diprivan) 250mg  IV  DESCRIPTION OF PROCEDURE:   After the risks benefits and alternatives of the procedure were thoroughly explained, informed consent was obtained.  A digital rectal exam revealed no abnormalities of the rectum.   The [id number]  endoscope was introduced through the anus and advanced to the cecum, which was identified by both the appendix and ileocecal valve. No adverse events experienced.   The quality of the prep was good, using MoviPrep  The instrument was then slowly withdrawn as the colon was fully examined.  COLON FINDINGS: Three diminutive polyps were found in the transverse, descending,and sigmoid colon.  A polypectomy was performed with a cold snare.  The resection was complete and the polyp tissue was completely retrieved.   Moderate diverticulosis was noted in the left colon.   The colon mucosa was otherwise normal.  Retroflexed views revealed internal hemorrhoids. The time to cecum=2 minutes 0 seconds.  Withdrawal time=13 minutes 36 seconds.  The scope was withdrawn and the procedure completed. COMPLICATIONS: There  were no complications.  ENDOSCOPIC IMPRESSION: 1.   Three diminutive polyps were found in thecolon; polypectomy was performed with a cold snare 2.   Moderate diverticulosis was noted in the left colon 3.   The colon mucosa was otherwise normal  RECOMMENDATIONS: 1. Follow up colonoscopy in 5 years   eSigned:  Roxy Cedar, MD 09/28/2013 4:11 PM   cc: Kari Baars, MD and The Patient

## 2013-09-28 NOTE — Progress Notes (Signed)
No complaints noted in the recovery room. Maw   

## 2013-09-28 NOTE — Progress Notes (Signed)
Lidocaine-40mg IV prior to Propofol InductionPropofol given over incremental dosages 

## 2013-09-28 NOTE — Patient Instructions (Addendum)
YOU HAD AN ENDOSCOPIC PROCEDURE TODAY AT THE [facility]: Refer to the procedure report that was given to you for any specific questions about what was found during the examination.  If the procedure report does not answer your questions, please call your gastroenterologist to clarify.  If you requested that your care partner not be given the details of your procedure findings, then the procedure report has been included in a sealed envelope for you to review at your convenience later.  YOU SHOULD EXPECT: Some feelings of bloating in the abdomen. Passage of more gas than usual.  Walking can help get rid of the air that was put into your GI tract during the procedure and reduce the bloating. If you had a lower endoscopy (such as a colonoscopy or flexible sigmoidoscopy) you may notice spotting of blood in your stool or on the toilet paper. If you underwent a bowel prep for your procedure, then you may not have a normal bowel movement for a few days.  DIET: Your first meal following the procedure should be a light meal and then it is ok to progress to your normal diet.  A half-sandwich or bowl of soup is an example of a good first meal.  Heavy or fried foods are harder to digest and may make you feel nauseous or bloated.  Likewise meals heavy in dairy and vegetables can cause extra gas to form and this can also increase the bloating.  Drink plenty of fluids but you should avoid alcoholic beverages for 24 hours.  ACTIVITY: Your care partner should take you home directly after the procedure.  You should plan to take it easy, moving slowly for the rest of the day.  You can resume normal activity the day after the procedure however you should NOT DRIVE or use heavy machinery for 24 hours (because of the sedation medicines used during the test).    SYMPTOMS TO REPORT IMMEDIATELY: A gastroenterologist can be reached at any hour.  During normal business hours, 8:30 AM to 5:00 PM Monday through Friday,  call [phone].  After hours and on weekends, please call the GI answering service at [phone] who will take a message and have the physician on call contact you.   Following lower endoscopy (colonoscopy or flexible sigmoidoscopy):  Excessive amounts of blood in the stool  Significant tenderness or worsening of abdominal pains  Swelling of the abdomen that is new, acute  Fever of 100F or higher  FOLLOW UP: If any biopsies were taken you will be contacted by phone or by letter within the next 1-3 weeks.  Call your gastroenterologist if you have not heard about the biopsies in 3 weeks.  Our staff will call the home number listed on your records the next business day following your procedure to check on you and address any questions or concerns that you may have at that time regarding the information given to you following your procedure. This is a courtesy call and so if there is no answer at the home number and we have not heard from you through the emergency physician on call, we will assume that you have returned to your regular daily activities without incident.  SIGNATURES/CONFIDENTIALITY: You and/or your care partner have signed paperwork which will be entered into your electronic medical record.  These signatures attest to the fact that that the information above on your After Visit Summary has been reviewed and is understood.  Full responsibility of the confidentiality of this   discharge information lies with you and/or your care-partner.   Handouts were given to your care partner on polyps, diverticulosis and a high fiber diet with liberal fluid intake. You may resume your current medications today. Repeat colonoscopy in 5 years. Please call if any questions or concerns. Your blood sugar was 134 in the recovery room.

## 2013-09-28 NOTE — Progress Notes (Signed)
Called to room to assist during endoscopic procedure.  Patient ID and intended procedure confirmed with present staff. Received instructions for my participation in the procedure from the performing physician.  

## 2013-09-29 ENCOUNTER — Telehealth: Payer: Self-pay | Admitting: *Deleted

## 2013-09-29 NOTE — Telephone Encounter (Signed)
Number identifier, left message, follow-up  

## 2013-10-05 ENCOUNTER — Encounter: Payer: Self-pay | Admitting: Internal Medicine

## 2013-10-07 ENCOUNTER — Encounter: Payer: Self-pay | Admitting: Cardiovascular Disease

## 2013-10-09 HISTORY — PX: COLONOSCOPY: SHX174

## 2013-10-14 ENCOUNTER — Encounter: Payer: Self-pay | Admitting: Family

## 2013-10-15 ENCOUNTER — Encounter: Payer: Self-pay | Admitting: Family

## 2013-10-15 ENCOUNTER — Ambulatory Visit (HOSPITAL_COMMUNITY)
Admission: RE | Admit: 2013-10-15 | Discharge: 2013-10-15 | Disposition: A | Discharge status: Home / Self Care | Payer: Medicare Other | Source: Ambulatory Visit | Attending: Family | Admitting: Family

## 2013-10-15 ENCOUNTER — Other Ambulatory Visit (HOSPITAL_COMMUNITY): Payer: Medicare Other

## 2013-10-15 ENCOUNTER — Ambulatory Visit (INDEPENDENT_AMBULATORY_CARE_PROVIDER_SITE_OTHER): Payer: Medicare Other | Admitting: Family

## 2013-10-15 DIAGNOSIS — Z48812 Encounter for surgical aftercare following surgery on the circulatory system: Secondary | ICD-10-CM | POA: Insufficient documentation

## 2013-10-15 DIAGNOSIS — I6529 Occlusion and stenosis of unspecified carotid artery: Secondary | ICD-10-CM | POA: Insufficient documentation

## 2013-10-15 NOTE — Patient Instructions (Signed)
Stroke Prevention Some medical conditions and behaviors are associated with an increased chance of having a stroke. You may prevent a stroke by making healthy choices and managing medical conditions. Reduce your risk of having a stroke by:  Staying physically active. Get at least 30 minutes of activity on most or all days.  Not smoking. It may also be helpful to avoid exposure to secondhand smoke.  Limiting alcohol use. Moderate alcohol use is considered to be:  No more than 2 drinks per day for men.  No more than 1 drink per day for nonpregnant women.  Eating healthy foods.  Include 5 or more servings of fruits and vegetables a day.  Certain diets may be prescribed to address high blood pressure, high cholesterol, diabetes, or obesity.  Managing your cholesterol levels.  A low-saturated fat, low-trans fat, low-cholesterol, and high-fiber diet may control cholesterol levels.  Take any prescribed medicines to control cholesterol as directed by your caregiver.  Managing your diabetes.  A controlled-carbohydrate, controlled-sugar diet is recommended to manage diabetes.  Take any prescribed medicines to control diabetes as directed by your caregiver.  Controlling your high blood pressure (hypertension).  A low-salt (sodium), low-saturated fat, low-trans fat, and low-cholesterol diet is recommended to manage high blood pressure.  Take any prescribed medicines to control hypertension as directed by your caregiver.  Maintaining a healthy weight.  A reduced-calorie, low-sodium, low-saturated fat, low-trans fat, low-cholesterol diet is recommended to manage weight.  Stopping drug abuse.  Avoiding birth control pills.  Talk to your caregiver about the risks of taking birth control pills if you are over 35 years old, smoke, get migraines, or have ever had a blood clot.  Getting evaluated for sleep disorders (sleep apnea).  Talk to your caregiver about getting a sleep evaluation  if you snore a lot or have excessive sleepiness.  Taking medicines as directed by your caregiver.  For some people, aspirin or blood thinners (anticoagulants) are helpful in reducing the risk of forming abnormal blood clots that can lead to stroke. If you have the irregular heart rhythm of atrial fibrillation, you should be on a blood thinner unless there is a good reason you cannot take them.  Understand all your medicine instructions. SEEK IMMEDIATE MEDICAL CARE IF:   You have sudden weakness or numbness of the face, arm, or leg, especially on one side of the body.  You have sudden confusion.  You have trouble speaking (aphasia) or understanding.  You have sudden trouble seeing in one or both eyes.  You have sudden trouble walking.  You have dizziness.  You have a loss of balance or coordination.  You have a sudden, severe headache with no known cause.  You have new chest pain or an irregular heartbeat. Any of these symptoms may represent a serious problem that is an emergency. Do not wait to see if the symptoms will go away. Get medical help right away. Call your local emergency services (911 in [place] Do not drive yourself to the hospital. Document Released: 11/28/2004 Document Revised: 01/13/2012 Document Reviewed: 04/23/2013 [employer] Patient Information 2014 [employer].  

## 2013-10-15 NOTE — Progress Notes (Signed)
Established Carotid Patient  History of Present Illness  Matthew Bates is a 67 y.o. male patient of Dr. Imogene Burn who underwent a left CEA in July 2012.  He returns today for carotid artery surveillance.   Patient has Negative history of TIA or stroke symptom.  The patient denies amaurosis fugax or monocular blindness.  The patient  denies facial drooping.  Pt. denies hemiplegia.  The patient denies receptive or expressive aphasia.  Pt. denies extremity weakness.  Patient denies New Medical or Surgical History. He states his cholesterol is great other than low HDL and elevated triglycerides. Sees a cardiologist for CAD who prescribes 2 beta blockers for patient which patient states works well for him.  Pt Diabetic: Yes, states his last A1C was 8.0, he admits to dietary indiscretions but he is now back on track Pt smoker: former smoker, quit 1972  Pt meds include: Statin : Yes ASA: Yes Other anticoagulants/antiplatelets: no   Past Medical History  Diagnosis Date  . Carotid artery bruit   . DM (diabetes mellitus)   . CAD (coronary artery disease)   . HTN (hypertension)   . Hyperlipemia   . GERD (gastroesophageal reflux disease)   . Gout   . Anemia   . Obesity   . High triglycerides   . Carotid artery occlusion   . Chronic kidney disease     Social History History  Substance Use Topics  . Smoking status: Former Smoker    Types: Cigarettes    Quit date: 11/04/1970  . Smokeless tobacco: Never Used  . Alcohol Use: Yes     Comment: rare    Family History Family History  Problem Relation Age of Onset  . Coronary artery disease    . Heart disease Father   . Coronary artery disease Father   . Heart attack Father   . Diabetes Brother   . Heart disease Brother   . Hyperlipidemia Brother   . Hypertension Brother   . Colon cancer Neg Hx     Surgical History Past Surgical History  Procedure Laterality Date  . Angioplasty    . Appendectomy    . Cataract extraction    .  Carotid endarterectomy  05/22/11    Left CEA  . Knee surgery      right  . Eye surgery    . Pr vein bypass graft,aorto-fem-pop  2010  . Spleen surgery      Allergies  Allergen Reactions  . Metoclopramide Hcl     Reglan=Increases HBP symptoms    Current Outpatient Prescriptions  Medication Sig Dispense Refill  . amLODipine (NORVASC) 5 MG tablet TAKE 1 TABLET TWICE DAILY  180 tablet  3  . aspirin 325 MG tablet Take 325 mg by mouth daily.        . cyanocobalamin (,VITAMIN B-12,) 1000 MCG/ML injection Inject 1 mL (1,000 mcg total) into the muscle every 30 (thirty) days.  1 mL  0  . fenofibrate 160 MG tablet Take 1 tablet (160 mg total) by mouth daily.  30 tablet  5  . folic acid (FOLVITE) 1 MG tablet Take 1 mg by mouth daily.        . furosemide (LASIX) 40 MG tablet Take 20 mg by mouth daily.       Marland Kitchen labetalol (NORMODYNE) 200 MG tablet TAKE 1 TABLET BY MOUTH TWICE DAILY  180 tablet  0  . Lansoprazole (PREVACID PO) Take 20 mg by mouth daily.       Marland Kitchen losartan (  COZAAR) 100 MG tablet Take 100 mg by mouth daily.       . MECLIZINE HCL PO Take by mouth as needed.        . metFORMIN (GLUCOPHAGE) 1000 MG tablet Take 1,000 mg by mouth 2 (two) times daily with a meal.       . metoprolol succinate (TOPROL-XL) 25 MG 24 hr tablet Take 50 mg by mouth daily.       . Multiple Vitamin (MULTIVITAMIN) capsule Take 1 capsule by mouth daily.        . simvastatin (ZOCOR) 40 MG tablet Take 40 mg by mouth at bedtime.         No current facility-administered medications for this visit.    Review of Systems :  Pertinent positives and negatives in HPI.   Physical Examination  Filed Vitals:   10/15/13 0945  BP: 144/76  Pulse: 71  Resp: 16   Filed Weights   10/15/13 0945  Weight: 206 lb (93.441 kg)   Body mass index is 30.86 kg/(m^2).  General: WDWN male in NAD GAIT: normal Eyes: PERRLA Pulmonary:  CTAB, Negative  Rales, Negative rhonchi, & Negative wheezing.  Cardiac: regular Rhythm ,   Negative Murmurs.  VASCULAR EXAM Carotid Bruits Left Right   Negative Negative     Radial pulses are 2+ palpable and equal.                                                                                                                            LE Pulses LEFT RIGHT       POPLITEAL  not palpable   not palpable       POSTERIOR TIBIAL   palpable    palpable        DORSALIS PEDIS      ANTERIOR TIBIAL  palpable   palpable     Gastrointestinal: soft, nontender, BS WNL, no r/g,  negative masses.  Musculoskeletal: Negative muscle atrophy/wasting. M/S 5/5 throughout, Extremities without ischemic changes.  Neurologic: A&O X 3; Appropriate Affect ; SENSATION ;normal;  Speech is normal CN 2-12 intact, Pain and light touch intact in extremities, Motor exam as listed above.   Non-Invasive Vascular Imaging CAROTID DUPLEX 10/15/2013   Right ICA: 40 - 59 % stenosis. Left ICA: patent CEA site with no evidence of restenosis.  Previous carotid studies demonstrated: RICA 60 - 79 % stenosis, LICA patent.  These findings are Improved from previous exam.     Assessment: Matthew Bates is a 67 y.o. male who presents with asymptomatic 40 - 59 % Right ICA stenosis and patent left ICA which is the CEA site. The right ICA stenosis is  Improved from previous exam a year ago.  Plan: Follow-up in 1 year with Carotid Duplex scan.   I discussed in depth with the patient the nature of atherosclerosis, and emphasized the importance of maximal medical management including strict control of blood pressure, blood glucose, and lipid levels, obtaining  regular exercise, and continued cessation of smoking.  The patient is aware that without maximal medical management the underlying atherosclerotic disease process will progress, limiting the benefit of any interventions. The patient was given information about stroke prevention and what symptoms should prompt the patient to seek immediate medical care. Thank  you for allowing [place] to participate in this patient's care.  Charisse March, RN, MSN, FNP-C [facility] Office: [phone]  Clinic Physician: Imogene Burn  10/15/2013 9:01 AM

## 2013-11-15 ENCOUNTER — Ambulatory Visit (INDEPENDENT_AMBULATORY_CARE_PROVIDER_SITE_OTHER): Payer: Medicare HMO | Admitting: *Deleted

## 2013-11-15 DIAGNOSIS — E785 Hyperlipidemia, unspecified: Secondary | ICD-10-CM

## 2013-11-15 DIAGNOSIS — I10 Essential (primary) hypertension: Secondary | ICD-10-CM

## 2013-11-15 LAB — LDL CHOLESTEROL, DIRECT: Direct LDL: 86.3 mg/dL

## 2013-11-15 LAB — BASIC METABOLIC PANEL
BUN: 15 mg/dL (ref 6–23)
CHLORIDE: 102 meq/L (ref 96–112)
CO2: 28 mEq/L (ref 19–32)
Calcium: 9 mg/dL (ref 8.4–10.5)
Creatinine, Ser: 1.3 mg/dL (ref 0.4–1.5)
GFR: 57.73 mL/min — AB (ref >60.00)
Glucose, Bld: 225 mg/dL — ABNORMAL HIGH (ref 70–99)
POTASSIUM: 4 meq/L (ref 3.5–5.1)
Sodium: 138 mEq/L (ref 135–145)

## 2013-11-15 LAB — LIPID PANEL
Cholesterol: 174 mg/dL (ref 0–200)
HDL: 32 mg/dL — ABNORMAL LOW (ref >39.00)
Total CHOL/HDL Ratio: 5
Triglycerides: 348 mg/dL — ABNORMAL HIGH (ref 0.0–149.0)
VLDL: 69.6 mg/dL — ABNORMAL HIGH (ref 0.0–40.0)

## 2013-11-15 LAB — HEPATIC FUNCTION PANEL
ALT: 22 U/L (ref 0–53)
AST: 20 U/L (ref 0–37)
Albumin: 3.7 g/dL (ref 3.5–5.2)
Alkaline Phosphatase: 31 U/L — ABNORMAL LOW (ref 39–117)
BILIRUBIN DIRECT: 0 mg/dL (ref 0.0–0.3)
BILIRUBIN TOTAL: 0.4 mg/dL (ref 0.3–1.2)
Total Protein: 7 g/dL (ref 6.0–8.3)

## 2013-11-29 ENCOUNTER — Encounter (INDEPENDENT_AMBULATORY_CARE_PROVIDER_SITE_OTHER): Payer: Self-pay

## 2013-11-29 ENCOUNTER — Encounter: Payer: Self-pay | Admitting: Cardiovascular Disease

## 2013-11-29 ENCOUNTER — Ambulatory Visit (INDEPENDENT_AMBULATORY_CARE_PROVIDER_SITE_OTHER): Payer: Medicare HMO | Admitting: Cardiovascular Disease

## 2013-11-29 VITALS — BP 151/80 | HR 73 | Ht 69.0 in | Wt 206.5 lb

## 2013-11-29 DIAGNOSIS — I10 Essential (primary) hypertension: Secondary | ICD-10-CM

## 2013-11-29 DIAGNOSIS — I2510 Atherosclerotic heart disease of native coronary artery without angina pectoris: Secondary | ICD-10-CM

## 2013-11-29 DIAGNOSIS — E785 Hyperlipidemia, unspecified: Secondary | ICD-10-CM

## 2013-11-29 NOTE — Assessment & Plan Note (Signed)
His blood pressure is a little bit elevated today but he keeps his blood pressure readings at home and states that they're always in the normal range.  i have Asked him to record his blood pressure readings. If his blood pressures start to become more elevated, we will substitute Diovan for his losartan.  Clinically, we could substitute carvedilol for the metoprolol XL.

## 2013-11-29 NOTE — Assessment & Plan Note (Signed)
Matthew Bates is doing well. He's not having episodes of chest pain. His cholesterol levels are fairly well controlled. His triglyceride levels are markedly elevated. We will refer him to our  lipid clinic in [facility], PharmD) .

## 2013-11-29 NOTE — Progress Notes (Signed)
Matthew Bates Date of Birth  [birth year]       [facility]    [employer] [address], [place], [place]  [address]   [place], [place]  [address] [phone]     [phone]   Fax  [phone]    Fax [phone]  Problem List: 1. Coronary artery disease-status post multiple PCI's and CABG in May, 2010 2. Peripheral vascular disease- moderate carotid artery disease 3. Hyperlipidemia 4. Diabetes mellitus 5. History of Present Illness: 68 yo with history of CAD with multiple angioplasties culminating in CABG in 5/10. He developed weakness/fatigue in 12/10 and had repeat cath with occlusion of the SVG-PDA. At that time, he had Xience DES to the distal RCA and the proximal CFX. EF was preserved. Since then, he has done well symptomatically. 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 He has had dyspnea and fatigue as anginal equivalents in the past, never much chest pain. Currently, he denies exertional dyspnea or fatigue. He has had no chest pain. He is able to climb up a flight of steps with no problems. He had a complicated cataract removal recently and is still recovering from that, though eyesight has improved. Finally, he has been noted to have progressive LICA stenosis on carotid dopplers. He is scheduled to see Dr. Bridgett Larsson for evaluation for CEA. He has had no stroke-like symptoms.   [name] 1, 2013 - Matthew Bates is a 68 yo with the above noted medical history.   He has not been having any chest pain - although he never had any chest pain even when he needed CABG.  he informed me that he had an abdominal aortic ultrasound this morning at his medical doctors office. He had an abdominal aortic ultrasound in May of this year at Dr. Lianne Moris office.  It was negative for abdominal aortic aneurysm.  He exercised regularly - 15 - 30 minutes a day on the treadmill.   Dr. Brigitte Pulse has been managing his  lipids.  His triglycerides are typically elevated.  HbA1C was 7 last week.  Jan. 26, 2015:  Matthew Bates has done well since I last saw him.   His son Corene Cornea and his wife  had a child.      He still walks on the treadmill regularly.  He has not having any chest pain.  His trigs are elevated.  His trigs typically run a bit high.  He thinks that he may have eaten a bit more than he should have the night before his blood was drawn.     Current Outpatient Prescriptions on File Prior to Visit  Medication Sig Dispense Refill  . amLODipine (NORVASC) 5 MG tablet TAKE 1 TABLET TWICE DAILY  180 tablet  3  . aspirin 325 MG tablet Take 325 mg by mouth daily.        . cyanocobalamin (,VITAMIN B-12,) 1000 MCG/ML injection Inject 1 mL (1,000 mcg total) into the muscle every 30 (thirty) days.  1 mL  0  . fenofibrate 160 MG tablet Take 1 tablet (160 mg total) by mouth daily.  30 tablet  5  . folic acid (FOLVITE) 1 MG tablet Take 1 mg by mouth daily.        . furosemide (LASIX) 40 MG tablet Take 20 mg by mouth daily.       Marland Kitchen labetalol (NORMODYNE) 200 MG tablet TAKE 1  TABLET BY MOUTH TWICE DAILY  180 tablet  0  . Lansoprazole (PREVACID PO) Take 20 mg by mouth daily.       Marland Kitchen losartan (COZAAR) 100 MG tablet Take 100 mg by mouth daily.       . MECLIZINE HCL PO Take by mouth as needed.        . metFORMIN (GLUCOPHAGE) 1000 MG tablet Take 1,000 mg by mouth 2 (two) times daily with a meal.       . metoprolol succinate (TOPROL-XL) 25 MG 24 hr tablet Take 50 mg by mouth daily.       . Multiple Vitamin (MULTIVITAMIN) capsule Take 1 capsule by mouth daily.        . simvastatin (ZOCOR) 40 MG tablet Take 40 mg by mouth at bedtime.         No current facility-administered medications on file prior to visit.    Allergies  Allergen Reactions  . Metoclopramide Hcl     Reglan=Increases HBP symptoms    Past Medical History  Diagnosis Date  . Carotid artery bruit   . DM (diabetes mellitus)   . CAD (coronary artery disease)     . HTN (hypertension)   . Hyperlipemia   . GERD (gastroesophageal reflux disease)   . Gout   . Anemia   . Obesity   . High triglycerides   . Carotid artery occlusion   . Chronic kidney disease     Past Surgical History  Procedure Laterality Date  . Angioplasty    . Appendectomy    . Cataract extraction    . Carotid endarterectomy  05/22/11    Left CEA  . Knee surgery      right  . Eye surgery    . Pr vein bypass graft,aorto-fem-pop  2010  . Spleen surgery    . Colonoscopy  10-09-13    History  Smoking status  . Former Smoker  . Types: Cigarettes  . Quit date: 11/04/1970  Smokeless tobacco  . Never Used    History  Alcohol Use  . Yes    Comment: rare    Family History  Problem Relation Age of Onset  . Coronary artery disease    . Heart disease Father     Heart Disease before age 13  . Coronary artery disease Father   . Heart attack Father   . Hyperlipidemia Father   . Hypertension Father   . Diabetes Brother   . Heart disease Brother     Heart Disese before age 47  . Hyperlipidemia Brother   . Hypertension Brother   . Cancer Brother   . Colon cancer Neg Hx   . Heart disease Mother     Heart Disease before age 83  . Hyperlipidemia Mother   . Hypertension Mother   . Heart attack Mother     Reviw of Systems:  Reviewed in the HPI.  All other systems are negative.  Physical Exam: Blood pressure 151/80, pulse 73, height 5\' 9"  (1.753 m), weight 206 lb 8 oz (93.668 kg). General: Well developed, well nourished, in no acute distress.  Head: Normocephalic, atraumatic, sclera non-icteric, mucus membranes are moist,   Neck: Supple. Carotids are 2 + with a soft right carotid bruit.  He has a left carotid endarterectomy scar.    No JVD Lungs: Clear bilaterally to auscultation. Heart: regular rate.  normal  S1 S2. No murmurs,  Abdomen: Soft, non-tender, non-distended with normal bowel sounds. No hepatomegaly. He is mildly obese.  Msk:  Strength and tone are  normal Extremities: No clubbing or cyanosis. No edema.  Distal pedal pulses are 2+ and equal bilaterally. Neuro: Alert and oriented X 3. Moves all extremities spontaneously. Psych:  Responds to questions appropriately with a normal affect.  ECG: Jan. 26, 2015:  NSR ,  TWI in the anterior and lateral leads - no significant changes from previous tracings.   Assessment / Plan:

## 2013-11-29 NOTE — Patient Instructions (Signed)
Your physician wants you to follow-up in: 1 year You will receive a reminder letter in the mail two months in advance. If you don't receive a letter, please call our office to schedule the follow-up appointment.  Your physician recommends that you return for lab work in: 1 year  Refer to lipid clinic in [place] office

## 2013-11-29 NOTE — Assessment & Plan Note (Signed)
Refer to lipid clinic.  I have encouraged him to increase his exercise.

## 2013-11-30 ENCOUNTER — Other Ambulatory Visit: Payer: Self-pay | Admitting: Cardiology

## 2013-12-03 ENCOUNTER — Ambulatory Visit (INDEPENDENT_AMBULATORY_CARE_PROVIDER_SITE_OTHER): Payer: Medicare HMO | Admitting: Pharmacist

## 2013-12-03 VITALS — Wt 204.0 lb

## 2013-12-03 DIAGNOSIS — Z79899 Other long term (current) drug therapy: Secondary | ICD-10-CM

## 2013-12-03 DIAGNOSIS — E785 Hyperlipidemia, unspecified: Secondary | ICD-10-CM

## 2013-12-03 MED ORDER — OMEGA-3-ACID ETHYL ESTERS 1 G PO CAPS: 2.0000 g | ORAL_CAPSULE | Freq: Two times a day (BID) | ORAL | Status: DC

## 2013-12-03 NOTE — Assessment & Plan Note (Addendum)
Patient with elevated TG and non-HDL which is likely worsened by his poor diet.  He is tolerating fenofibrate and simvastatin, but needs to add fish oil for further TG reduction.  We discussed at length dietary changes that need to be made.  The biggest obstacle is that he eats so many of his meals at local country restaurants and healthier food options aren't as accessible.  He is willing to stop eating a egg sandwich every morning and change to oatmeal with flaxseed.  Lunch will be more difficult to change at this time, but he agrees to reduce his red meat and eat more salads for this meal.  Dinner he typically gets on his way home with his wife, or cooks at home (50/50).  He agrees to avoid french fries and get chicken instead of beef.  He also agrees to cut down on eating fried foods.  Would like to get him to cook more at home, but this doesn't seem likely at this time.   Exercise should continue daily on the treadmill.  He is agreeable to try Lovaza (gave samples) since he had GI upset with over the counter fish oil.  If he doesn't tolerate this, may have to try krill oil, but will need high doses (8 daily) most likely.  Given elevated TG, non-HDL, and diabetes, will check an NMR in the future.  If LDL-P well above 1000 noml/L may have to change to Vytorin.  We discussed this in the office today, and is agreeable to this.  Patient will call if problems with medication. Plan: 1.  Start taking Lovaza 1 capsules - twice daily.  Try to increase to 2 capsules twice daily.  Take with food. 2.  Continue simvastatin 40 mg qd and fenofibrate in the evening with food. 3.  Stop eating egg sandwich in the morning.  Start eating oatmeal with flax seed in the morning.  Fruit as well. 4.  Reduce red meat in the diet.  Eat more chicken and fish.   5.  Stop eating french fries with evening meal. 6.  Continue walking on treadmill daily. 7.  In 3 months want to recheck blood work  02/28/14 fasting (will get an NMR  LipoProfile to check particle number, and will check liver function panel).  See Ysidro Evert 4 days later to review - 03/04/14 at 8:30

## 2013-12-03 NOTE — Patient Instructions (Signed)
Plan: 1.  Start taking Lovaza 1 capsules - twice daily.  Try to increase to 2 capsules twice daily.  Take with food. 2.  Continue simvastatin 40 mg qd and fenofibrate in the evening with food. 3.  Stop eating egg sandwich in the morning.  Start eating oatmeal with flax seed in the morning.  Fruit as well. 4.  Reduce red meat in the diet.  Eat more chicken and fish.   5.  Stop eating french fries with evening meal. 6.  Continue walking on treadmill daily. 7.  In 3 months want to recheck blood work  02/28/14 fasting (will get an NMR LipoProfile to check particle number, and will check liver function panel).  See Ysidro Evert 4 days later to review - 03/04/14 at 8:30

## 2013-12-03 NOTE — Progress Notes (Signed)
Patient is a pleasant 68 y.o. WM who is referred to lipid clinic by Dr. Acie Fredrickson for elevated non-HDL and TG in patient with h/o CAD and Diabetes.   He is accompanied by his wife today.  Patient currently on simvastatin 40 mg and fenofibrate 160 mg with evening meal, and is tolerating them both well.  He tells me he failed niacin in the past due to flushing, and experienced "burping and fishy taste" with over the counter fish oil in the past.  He attempted to put fish oil capsules in refrigerator, but still had GI issues.  He states krill oil didn't work for him, but that may possibly have been b/c he wasn't taking enough.  Patient lives around [place], and patient eats most of his meals at a restaurant that serves "country meals" as he describes it.  Most of his food is greasy or fried when served at these restaurants.  He goes to these places for social reasons and plans to continue to go there, but is willing to change what foods he does eat while he is there.  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  Risk Factors:  H/o CABG, diabetes, HTN, age, low HDL - LDL goal < 70, non-HDL goal < 100 Meds:  Simvastatin 40 mg qd, fenofibrate 160 mg with large meal. Intolerant:  Over the counter fish oil caused "burping and fishy taste", Niacin - horrible flushing.  Also has h/o gout so would want to avoid.  Social history:  Non-smoker.  Drinks alcohol (beer) very rarely Exercise:  He has a treadmill at work, and uses this 15-30 minutes per day.  He uses it less during tax season. Diet:  His diet is high in fat, and consists mostly of eating out.  He has a group of guys he meets every morning at a local country restaurant, and this isn't going to change he tells me.  Currently for breakfast he eats a egg and mayonnaise sandwich and coffee.  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  Dinner is "his worst" which is a hamburger or hotdog he gets fast food, and  splits french fries with his wife.  He drinks water or unsweetened tea during the day.  May drink a diet soda a few days of the week.   He eats out most meals which isn't likely going to change, but he is willing to change what he does eat he tells me.  Labs:   11/2013 - TC 174, TG 348, LDL 86 (goal < 70), HDL 32, non-HDL 142 (goal < 100), LFTs normal, A1C 7.0 - fenofibrate 160 mg qd, simvastatin 40 mg qd.  Current Outpatient Prescriptions  Medication Sig Dispense Refill  . amLODipine (NORVASC) 5 MG tablet TAKE 1 TABLET TWICE DAILY  180 tablet  3  . aspirin 325 MG tablet Take 325 mg by mouth daily.        . cyanocobalamin (,VITAMIN B-12,) 1000 MCG/ML injection Inject 1 mL (1,000 mcg total) into the muscle every 30 (thirty) days.  1 mL  0  . fenofibrate 160 MG tablet Take 1 tablet (160 mg total) by mouth daily.  30 tablet  5  . folic acid (FOLVITE) 1 MG tablet Take 1 mg by mouth daily.        . furosemide (LASIX) 40 MG tablet Take 20 mg by mouth daily.       Marland Kitchen labetalol (NORMODYNE) 200 MG tablet TAKE  1 TABLET BY MOUTH TWICE DAILY  180 tablet  2  . Lansoprazole (PREVACID PO) Take 20 mg by mouth daily.       Marland Kitchen losartan (COZAAR) 100 MG tablet Take 100 mg by mouth daily.       . MECLIZINE HCL PO Take by mouth as needed.        . metFORMIN (GLUCOPHAGE) 1000 MG tablet Take 1,000 mg by mouth 2 (two) times daily with a meal.       . metoprolol succinate (TOPROL-XL) 25 MG 24 hr tablet Take 50 mg by mouth daily.       . Multiple Vitamin (MULTIVITAMIN) capsule Take 1 capsule by mouth daily.        . simvastatin (ZOCOR) 40 MG tablet Take 40 mg by mouth at bedtime.         No current facility-administered medications for this visit.   Allergies  Allergen Reactions  . Metoclopramide Hcl     Reglan=Increases HBP symptoms   Family History  Problem Relation Age of Onset  . Coronary artery disease    . Heart disease Father     Heart Disease before age 50  . Coronary artery disease Father   . Heart  attack Father   . Hyperlipidemia Father   . Hypertension Father   . Diabetes Brother   . Heart disease Brother     Heart Disese before age 76  . Hyperlipidemia Brother   . Hypertension Brother   . Cancer Brother   . Colon cancer Neg Hx   . Heart disease Mother     Heart Disease before age 69  . Hyperlipidemia Mother   . Hypertension Mother   . Heart attack Mother

## 2014-02-28 ENCOUNTER — Other Ambulatory Visit (INDEPENDENT_AMBULATORY_CARE_PROVIDER_SITE_OTHER): Payer: Medicare HMO

## 2014-02-28 DIAGNOSIS — Z79899 Other long term (current) drug therapy: Secondary | ICD-10-CM

## 2014-02-28 DIAGNOSIS — E785 Hyperlipidemia, unspecified: Secondary | ICD-10-CM

## 2014-02-28 LAB — HEPATIC FUNCTION PANEL
ALK PHOS: 32 U/L — AB (ref 39–117)
ALT: 23 U/L (ref 0–53)
AST: 21 U/L (ref 0–37)
Albumin: 3.9 g/dL (ref 3.5–5.2)
BILIRUBIN DIRECT: 0 mg/dL (ref 0.0–0.3)
BILIRUBIN TOTAL: 0.3 mg/dL (ref 0.3–1.2)
Total Protein: 7.3 g/dL (ref 6.0–8.3)

## 2014-03-01 LAB — NMR LIPOPROFILE WITH LIPIDS
CHOLESTEROL, TOTAL: 158 mg/dL (ref <200)
HDL Particle Number: 29 umol/L — ABNORMAL LOW (ref >=30.5)
HDL Size: 8.7 nm — ABNORMAL LOW (ref >=9.2)
HDL-C: 31 mg/dL — AB (ref >=40)
LDL PARTICLE NUMBER: 1103 nmol/L — AB (ref <1000)
LDL Size: 19.4 nm — ABNORMAL LOW (ref >20.5)
LP-IR Score: 75 — ABNORMAL HIGH (ref <=45)
Large HDL-P: <1.3 umol/L — ABNORMAL LOW (ref >=4.8)
Large VLDL-P: 13.5 nmol/L — ABNORMAL HIGH (ref <=2.7)
Small LDL Particle Number: 955 nmol/L — ABNORMAL HIGH (ref <=527)
Triglycerides: 440 mg/dL — ABNORMAL HIGH (ref <150)
VLDL SIZE: 49.9 nm — AB (ref <=46.6)

## 2014-03-04 ENCOUNTER — Ambulatory Visit: Payer: Medicare HMO | Admitting: Pharmacist

## 2014-03-04 ENCOUNTER — Telehealth: Payer: Self-pay | Admitting: Pharmacist

## 2014-03-04 DIAGNOSIS — E785 Hyperlipidemia, unspecified: Secondary | ICD-10-CM

## 2014-03-04 DIAGNOSIS — Z79899 Other long term (current) drug therapy: Secondary | ICD-10-CM

## 2014-03-04 MED ORDER — EQL FISH OIL 1000 MG PO CAPS: 2.0000 | ORAL_CAPSULE | Freq: Two times a day (BID) | ORAL | Status: DC

## 2014-03-04 NOTE — Telephone Encounter (Signed)
Patient missed his lipid clinic visit today, so called with results:  LDL-P number 1100, TG 440, see other lab results in chart.  Risk Factors: H/o CABG, diabetes, HTN, age, low HDL - LDL goal < 70, non-HDL goal < 100, LDL-P number goal < 1000 Meds: Simvastatin 40 mg qd, fenofibrate 160 mg with large meal, fish oil 2 g/d Intolerant: Over the counter fish oil caused "burping and fishy taste", Niacin - horrible flushing. Also has h/o gout so would want to avoid. Patient was suppose to take Lovaza 2 g/d, but after a few weeks of this, patient changed to OTC fish oil instead, and took 1-2 per day. This is likely reason TG have gone up from 348 up to 440. LDL-P 1100, with ~ 950 being small particles, which is to be expected due to elevated TG. If we can get TG down, his total LDL-P should trend down as well. Diet: He states has improved. He has reduced eating out, eating less eggs, and reduced alcohol intake. Home glucose ~ 170 mg/dL. He does tell me he is off flax seed as he can't find it anymore. Patient would like to use OTC fish oil since he just bought a lot, instead of going back to Lovaza. I explained he's going to have to take at least 4-6 g per day to get a significant TG reduction. He agrees to do this. Plan: 1. Continue Simvastatin 40 mg qd and fenofibrate 160 mg qd with food. 2. Increase OTC fish oil to 4 g/d. If possible, take 6 per day. 3. Recheck lipid panel / hepatic panel in 4 months (lab set up for 07/05/14).  If TG elevated at that time, he will need to change back to Lovaza.  To Dr. Acie Fredrickson as FYI only.

## 2014-04-22 ENCOUNTER — Other Ambulatory Visit: Payer: Self-pay | Admitting: Cardiovascular Disease

## 2014-07-05 ENCOUNTER — Other Ambulatory Visit: Payer: Medicare HMO

## 2014-09-03 ENCOUNTER — Other Ambulatory Visit: Payer: Self-pay | Admitting: Cardiology

## 2014-10-14 ENCOUNTER — Other Ambulatory Visit (HOSPITAL_COMMUNITY): Payer: Medicare Other

## 2014-10-14 ENCOUNTER — Ambulatory Visit: Payer: Medicare Other | Admitting: Family

## 2014-11-09 ENCOUNTER — Encounter: Payer: Self-pay | Admitting: Family

## 2014-11-10 ENCOUNTER — Ambulatory Visit: Payer: Self-pay | Admitting: Family

## 2014-11-10 ENCOUNTER — Other Ambulatory Visit (HOSPITAL_COMMUNITY): Payer: Medicare HMO

## 2014-11-24 ENCOUNTER — Encounter: Payer: Self-pay | Admitting: Cardiovascular Disease

## 2014-11-24 ENCOUNTER — Encounter: Payer: Self-pay | Admitting: Family

## 2014-11-24 DIAGNOSIS — I10 Essential (primary) hypertension: Secondary | ICD-10-CM | POA: Diagnosis not present

## 2014-11-24 DIAGNOSIS — E538 Deficiency of other specified B group vitamins: Secondary | ICD-10-CM | POA: Diagnosis not present

## 2014-11-24 DIAGNOSIS — E785 Hyperlipidemia, unspecified: Secondary | ICD-10-CM | POA: Diagnosis not present

## 2014-11-24 DIAGNOSIS — Z0000 Encounter for general adult medical examination without abnormal findings: Secondary | ICD-10-CM | POA: Diagnosis not present

## 2014-11-24 DIAGNOSIS — Z125 Encounter for screening for malignant neoplasm of prostate: Secondary | ICD-10-CM | POA: Diagnosis not present

## 2014-11-24 DIAGNOSIS — E1129 Type 2 diabetes mellitus with other diabetic kidney complication: Secondary | ICD-10-CM | POA: Diagnosis not present

## 2014-11-24 DIAGNOSIS — M109 Gout, unspecified: Secondary | ICD-10-CM | POA: Diagnosis not present

## 2014-11-25 ENCOUNTER — Ambulatory Visit: Payer: Medicare HMO | Admitting: Family

## 2014-11-25 ENCOUNTER — Other Ambulatory Visit (HOSPITAL_COMMUNITY): Payer: Medicare HMO

## 2014-11-29 DIAGNOSIS — E785 Hyperlipidemia, unspecified: Secondary | ICD-10-CM | POA: Diagnosis not present

## 2014-11-29 DIAGNOSIS — I10 Essential (primary) hypertension: Secondary | ICD-10-CM | POA: Diagnosis not present

## 2014-11-29 DIAGNOSIS — E1129 Type 2 diabetes mellitus with other diabetic kidney complication: Secondary | ICD-10-CM | POA: Diagnosis not present

## 2014-11-29 DIAGNOSIS — E538 Deficiency of other specified B group vitamins: Secondary | ICD-10-CM | POA: Diagnosis not present

## 2014-11-29 DIAGNOSIS — M109 Gout, unspecified: Secondary | ICD-10-CM | POA: Diagnosis not present

## 2014-11-29 DIAGNOSIS — E781 Pure hyperglyceridemia: Secondary | ICD-10-CM | POA: Diagnosis not present

## 2014-11-29 DIAGNOSIS — Z1389 Encounter for screening for other disorder: Secondary | ICD-10-CM | POA: Diagnosis not present

## 2014-11-29 DIAGNOSIS — N183 Chronic kidney disease, stage 3 (moderate): Secondary | ICD-10-CM | POA: Diagnosis not present

## 2014-12-08 DIAGNOSIS — Z1212 Encounter for screening for malignant neoplasm of rectum: Secondary | ICD-10-CM | POA: Diagnosis not present

## 2014-12-14 ENCOUNTER — Encounter: Payer: Self-pay | Admitting: Family

## 2014-12-15 ENCOUNTER — Ambulatory Visit (HOSPITAL_COMMUNITY)
Admission: RE | Admit: 2014-12-15 | Discharge: 2014-12-15 | Disposition: A | Discharge status: Home / Self Care | Payer: Commercial Managed Care - HMO | Source: Ambulatory Visit | Attending: Family | Admitting: Family

## 2014-12-15 ENCOUNTER — Ambulatory Visit (INDEPENDENT_AMBULATORY_CARE_PROVIDER_SITE_OTHER): Payer: Commercial Managed Care - HMO | Admitting: Family

## 2014-12-15 ENCOUNTER — Encounter: Payer: Self-pay | Admitting: Family

## 2014-12-15 VITALS — BP 146/74 | HR 64 | Resp 16 | Ht 69.0 in | Wt 201.0 lb

## 2014-12-15 DIAGNOSIS — I6521 Occlusion and stenosis of right carotid artery: Secondary | ICD-10-CM | POA: Diagnosis not present

## 2014-12-15 DIAGNOSIS — E1165 Type 2 diabetes mellitus with hyperglycemia: Secondary | ICD-10-CM

## 2014-12-15 DIAGNOSIS — Z48812 Encounter for surgical aftercare following surgery on the circulatory system: Secondary | ICD-10-CM

## 2014-12-15 DIAGNOSIS — Z87891 Personal history of nicotine dependence: Secondary | ICD-10-CM | POA: Insufficient documentation

## 2014-12-15 DIAGNOSIS — Z9889 Other specified postprocedural states: Secondary | ICD-10-CM

## 2014-12-15 DIAGNOSIS — IMO0002 Reserved for concepts with insufficient information to code with codable children: Secondary | ICD-10-CM

## 2014-12-15 DIAGNOSIS — I6523 Occlusion and stenosis of bilateral carotid arteries: Secondary | ICD-10-CM

## 2014-12-15 NOTE — Addendum Note (Signed)
Addended by: Mena Goes on: 12/15/2014 01:47 PM   Modules accepted: Orders

## 2014-12-15 NOTE — Progress Notes (Signed)
Established Carotid Patient   History of Present Illness  Matthew Bates is a 69 y.o. male patient of Dr. Bridgett Larsson who underwent a left CEA in July 2012.  He returns today for carotid artery surveillance.   Patient has Negative history of TIA or stroke symptom. The patient denies amaurosis fugax or monocular blindness. The patient denies facial drooping. Pt. denies hemiplegia. The patient denies receptive or expressive aphasia. Pt. denies extremity weakness. He denies any claudication in his legs with walking, denies non healing wounds.  He walks on his treadmill 30 minutes/day, 5-7 days/week; he started this October or November 2015.  Patient denies New Medical or Surgical History. He states his cholesterol is great other than low HDL and elevated triglycerides. Sees a cardiologist for CAD who prescribes 2 beta blockers for patient which patient states works well for him.  Pt Diabetic: Yes, states his last A1C was 8.1, he admits to dietary indiscretions but he is now back on track, but states he lost 10 pounds since October 2015 Pt smoker: former smoker, quit 1972  Pt meds include: Statin : Yes ASA: Yes Other anticoagulants/antiplatelets: no  Past Medical History  Diagnosis Date  . Carotid artery bruit   . DM (diabetes mellitus)   . CAD (coronary artery disease)   . HTN (hypertension)   . Hyperlipemia   . GERD (gastroesophageal reflux disease)   . Gout   . Anemia   . Obesity   . High triglycerides   . Carotid artery occlusion   . Chronic kidney disease     Social History History  Substance Use Topics  . Smoking status: Former Smoker    Types: Cigarettes    Quit date: 11/04/1970  . Smokeless tobacco: Never Used  . Alcohol Use: Yes     Comment: rare    Family History Family History  Problem Relation Age of Onset  . Coronary artery disease    . Heart disease Father     Heart Disease before age 48  . Coronary artery disease Father   . Heart attack Father   .  Hyperlipidemia Father   . Hypertension Father   . Diabetes Brother   . Heart disease Brother     Heart Disese before age 18  . Hyperlipidemia Brother   . Hypertension Brother   . Cancer Brother   . Colon cancer Neg Hx   . Heart disease Mother     Heart Disease before age 29  . Hyperlipidemia Mother   . Hypertension Mother   . Heart attack Mother     Surgical History Past Surgical History  Procedure Laterality Date  . Angioplasty    . Appendectomy    . Cataract extraction    . Carotid endarterectomy  05/22/11    Left CEA  . Knee surgery      right  . Eye surgery    . Pr vein bypass graft,aorto-fem-pop  2010  . Spleen surgery    . Colonoscopy  10-09-13    Allergies  Allergen Reactions  . Metoclopramide Hcl Hypertension    Reglan=Increases HBP symptoms    Current Outpatient Prescriptions  Medication Sig Dispense Refill  . amLODipine (NORVASC) 5 MG tablet TAKE 1 TABLET TWICE DAILY 180 tablet 2  . aspirin 325 MG tablet Take 325 mg by mouth daily.      . cyanocobalamin (,VITAMIN B-12,) 1000 MCG/ML injection Inject 1 mL (1,000 mcg total) into the muscle every 30 (thirty) days. 1 mL 0  . fenofibrate  160 MG tablet Take 1 tablet (160 mg total) by mouth daily. 30 tablet 5  . folic acid (FOLVITE) 1 MG tablet Take 1 mg by mouth daily.      . furosemide (LASIX) 40 MG tablet Take 20 mg by mouth daily.     Marland Kitchen labetalol (NORMODYNE) 200 MG tablet TAKE 1 TABLET BY MOUTH TWICE A DAY 180 tablet 2  . Lansoprazole (PREVACID PO) Take 20 mg by mouth daily.     Marland Kitchen losartan (COZAAR) 100 MG tablet Take 100 mg by mouth daily.     . MECLIZINE HCL PO Take by mouth as needed.      . metFORMIN (GLUCOPHAGE) 1000 MG tablet Take 1,000 mg by mouth 2 (two) times daily with a meal.     . metoprolol succinate (TOPROL-XL) 50 MG 24 hr tablet Take 50 mg by mouth daily.  3  . Multiple Vitamin (MULTIVITAMIN) capsule Take 1 capsule by mouth daily.      . simvastatin (ZOCOR) 40 MG tablet Take 40 mg by mouth at  bedtime.      . metoprolol succinate (TOPROL-XL) 25 MG 24 hr tablet Take 50 mg by mouth daily.     . Omega-3 Fatty Acids (EQL FISH OIL) 1000 MG CAPS Take 2 capsules (2,000 mg total) by mouth 2 (two) times daily. (Patient not taking: Reported on 12/15/2014) 90 each    No current facility-administered medications for this visit.    Review of Systems : See HPI for pertinent positives and negatives.  Physical Examination  Filed Vitals:   12/15/14 0954 12/15/14 0957  BP: 120/69 146/74  Pulse: 64 64  Resp:  16  Height:  5\' 9"  (1.753 m)  Weight:  201 lb (91.173 kg)  SpO2:  99%   Body mass index is 29.67 kg/(m^2).   General: WDWN male in NAD GAIT: normal Eyes: PERRLA Pulmonary: CTAB, Negative Rales, Negative rhonchi, & Negative wheezing.  Cardiac: regular Rhythm, no detected murmur.  VASCULAR EXAM Carotid Bruits Left Right   Negative Negative   Aorta is not palpable. Radial pulses are 2+ palpable and equal.      LE Pulses LEFT RIGHT   POPLITEAL not palpable  not palpable   POSTERIOR TIBIAL  palpable   palpable    DORSALIS PEDIS  ANTERIOR TIBIAL not palpable  palpable     Gastrointestinal: soft, nontender, BS WNL, no r/g, no palpable masses.  Musculoskeletal: Negative muscle atrophy/wasting. M/S 5/5 throughout, Extremities without ischemic changes. Lower legs with 1+ pitting edema, no chronic venous insufficiency changes.  Neurologic: A&O X 3; Appropriate Affect ; SENSATION ;normal;  Speech is normal CN 2-12 intact, Pain and light touch intact in extremities, Motor exam as listed above.           Non-Invasive Vascular Imaging CAROTID DUPLEX 12/15/2014   CEREBROVASCULAR DUPLEX EVALUATION    INDICATION: Carotid stenosis    PREVIOUS INTERVENTION(S): Left carotid endarterectomy 05/22/2011     DUPLEX EXAM:     RIGHT  LEFT  Peak Systolic Velocities (cm/s) End Diastolic Velocities (cm/s) Plaque LOCATION Peak Systolic Velocities (cm/s) End Diastolic Velocities (cm/s) Plaque  72 10  CCA PROXIMAL 114 21   85 17  CCA MID 90 21   66 18 HT CCA DISTAL 83 19   109 11 HT ECA 133 17   213 55 HT ICA PROXIMAL 103 23   155 40  ICA MID 107 31   112 35  ICA DISTAL 93 28     2.51  ICA / CCA Ratio (PSV) carotid endarterectomy  To-Fro Vertebral Flow Antegrade  376 Brachial Systolic Pressure (mmHg) 283  Triphasic Brachial Artery Waveforms Triphasic    Plaque Morphology:  HM = Homogeneous, HT = Heterogeneous, CP = Calcific Plaque, SP = Smooth Plaque, IP = Irregular Plaque     ADDITIONAL FINDINGS:     IMPRESSION: Right internal carotid artery stenosis present in the 40%-59% range. Left internal carotid artery is patent with history of carotid endarterectomy, no hyperplasia or hemodynamically significant plaque present.    Compared to the previous exam:  Unchanged since previous study on 10/15/2013.      Assessment: DMAURI ROSENOW is a 69 y.o. male who is s/p left CEA in July 2012. He presents with asymptomatic 40%-59% right ICA stenosis and patent left ICA with history of carotid endarterectomy, no hyperplasia or hemodynamically significant plaque present. Unchanged since previous study on 10/15/2013.   Plan: Follow-up in 1 year with Carotid Duplex.   I discussed in depth with the patient the nature of atherosclerosis, and emphasized the importance of maximal medical management including strict control of blood pressure, blood glucose, and lipid levels, obtaining regular exercise, and continued cessation of smoking.  The patient is aware that without maximal medical management the underlying atherosclerotic disease process will progress, limiting the benefit of any interventions. The patient was given information about stroke prevention and what symptoms should prompt the patient to seek  immediate medical care. Thank you for allowing [place] to participate in this patient's care.  Clemon Chambers, RN, MSN, FNP-C [facility] Office: [phone]  Clinic Physician: Scot Dock  12/15/2014 9:52 AM

## 2014-12-15 NOTE — Patient Instructions (Signed)
Stroke Prevention Some medical conditions and behaviors are associated with an increased chance of having a stroke. You may prevent a stroke by making healthy choices and managing medical conditions. HOW CAN I REDUCE MY RISK OF HAVING A STROKE?   Stay physically active. Get at least 30 minutes of activity on most or all days.  Do not smoke. It may also be helpful to avoid exposure to secondhand smoke.  Limit alcohol use. Moderate alcohol use is considered to be:  No more than 2 drinks per day for men.  No more than 1 drink per day for nonpregnant women.  Eat healthy foods. This involves:  Eating 5 or more servings of fruits and vegetables a day.  Making dietary changes that address high blood pressure (hypertension), high cholesterol, diabetes, or obesity.  Manage your cholesterol levels.  Making food choices that are high in fiber and low in saturated fat, trans fat, and cholesterol may control cholesterol levels.  Take any prescribed medicines to control cholesterol as directed by your health care provider.  Manage your diabetes.  Controlling your carbohydrate and sugar intake is recommended to manage diabetes.  Take any prescribed medicines to control diabetes as directed by your health care provider.  Control your hypertension.  Making food choices that are low in salt (sodium), saturated fat, trans fat, and cholesterol is recommended to manage hypertension.  Take any prescribed medicines to control hypertension as directed by your health care provider.  Maintain a healthy weight.  Reducing calorie intake and making food choices that are low in sodium, saturated fat, trans fat, and cholesterol are recommended to manage weight.  Stop drug abuse.  Avoid taking birth control pills.  Talk to your health care provider about the risks of taking birth control pills if you are over 35 years old, smoke, get migraines, or have ever had a blood clot.  Get evaluated for sleep  disorders (sleep apnea).  Talk to your health care provider about getting a sleep evaluation if you snore a lot or have excessive sleepiness.  Take medicines only as directed by your health care provider.  For some people, aspirin or blood thinners (anticoagulants) are helpful in reducing the risk of forming abnormal blood clots that can lead to stroke. If you have the irregular heart rhythm of atrial fibrillation, you should be on a blood thinner unless there is a good reason you cannot take them.  Understand all your medicine instructions.  Make sure that other conditions (such as anemia or atherosclerosis) are addressed. SEEK IMMEDIATE MEDICAL CARE IF:   You have sudden weakness or numbness of the face, arm, or leg, especially on one side of the body.  Your face or eyelid droops to one side.  You have sudden confusion.  You have trouble speaking (aphasia) or understanding.  You have sudden trouble seeing in one or both eyes.  You have sudden trouble walking.  You have dizziness.  You have a loss of balance or coordination.  You have a sudden, severe headache with no known cause.  You have new chest pain or an irregular heartbeat. Any of these symptoms may represent a serious problem that is an emergency. Do not wait to see if the symptoms will go away. Get medical help at once. Call your local emergency services (911 in [place] Do not drive yourself to the hospital. Document Released: 11/28/2004 Document Revised: 03/07/2014 Document Reviewed: 04/23/2013 [employer] Patient Information 2015 [employer]. This information is not intended to replace advice given   to you by your health care provider. Make sure you discuss any questions you have with your health care provider.  

## 2015-01-09 DIAGNOSIS — H5213 Myopia, bilateral: Secondary | ICD-10-CM | POA: Diagnosis not present

## 2015-01-09 DIAGNOSIS — H35033 Hypertensive retinopathy, bilateral: Secondary | ICD-10-CM | POA: Diagnosis not present

## 2015-01-09 DIAGNOSIS — H26493 Other secondary cataract, bilateral: Secondary | ICD-10-CM | POA: Diagnosis not present

## 2015-01-09 DIAGNOSIS — E119 Type 2 diabetes mellitus without complications: Secondary | ICD-10-CM | POA: Diagnosis not present

## 2015-03-21 DIAGNOSIS — D649 Anemia, unspecified: Secondary | ICD-10-CM | POA: Diagnosis not present

## 2015-05-03 DIAGNOSIS — E538 Deficiency of other specified B group vitamins: Secondary | ICD-10-CM | POA: Diagnosis not present

## 2015-06-05 ENCOUNTER — Other Ambulatory Visit: Payer: Self-pay | Admitting: Cardiology

## 2015-07-06 DIAGNOSIS — E538 Deficiency of other specified B group vitamins: Secondary | ICD-10-CM | POA: Diagnosis not present

## 2015-08-02 ENCOUNTER — Other Ambulatory Visit: Payer: Self-pay | Admitting: Cardiology

## 2015-08-02 ENCOUNTER — Other Ambulatory Visit: Payer: Self-pay | Admitting: Cardiovascular Disease

## 2015-08-10 ENCOUNTER — Other Ambulatory Visit: Payer: Self-pay | Admitting: Cardiology

## 2015-09-08 ENCOUNTER — Other Ambulatory Visit: Payer: Self-pay | Admitting: Cardiology

## 2015-11-10 DIAGNOSIS — E784 Other hyperlipidemia: Secondary | ICD-10-CM | POA: Diagnosis not present

## 2015-11-10 DIAGNOSIS — E1129 Type 2 diabetes mellitus with other diabetic kidney complication: Secondary | ICD-10-CM | POA: Diagnosis not present

## 2015-11-10 DIAGNOSIS — N183 Chronic kidney disease, stage 3 (moderate): Secondary | ICD-10-CM | POA: Diagnosis not present

## 2015-11-10 DIAGNOSIS — E538 Deficiency of other specified B group vitamins: Secondary | ICD-10-CM | POA: Diagnosis not present

## 2015-11-10 DIAGNOSIS — M109 Gout, unspecified: Secondary | ICD-10-CM | POA: Diagnosis not present

## 2015-11-10 DIAGNOSIS — Z125 Encounter for screening for malignant neoplasm of prostate: Secondary | ICD-10-CM | POA: Diagnosis not present

## 2015-11-14 DIAGNOSIS — E784 Other hyperlipidemia: Secondary | ICD-10-CM | POA: Diagnosis not present

## 2015-11-14 DIAGNOSIS — E1149 Type 2 diabetes mellitus with other diabetic neurological complication: Secondary | ICD-10-CM | POA: Diagnosis not present

## 2015-11-14 DIAGNOSIS — I10 Essential (primary) hypertension: Secondary | ICD-10-CM | POA: Diagnosis not present

## 2015-11-14 DIAGNOSIS — Z955 Presence of coronary angioplasty implant and graft: Secondary | ICD-10-CM | POA: Diagnosis not present

## 2015-11-14 DIAGNOSIS — M109 Gout, unspecified: Secondary | ICD-10-CM | POA: Diagnosis not present

## 2015-11-14 DIAGNOSIS — N183 Chronic kidney disease, stage 3 (moderate): Secondary | ICD-10-CM | POA: Diagnosis not present

## 2015-11-14 DIAGNOSIS — E538 Deficiency of other specified B group vitamins: Secondary | ICD-10-CM | POA: Diagnosis not present

## 2015-11-14 DIAGNOSIS — Z0000 Encounter for general adult medical examination without abnormal findings: Secondary | ICD-10-CM | POA: Diagnosis not present

## 2015-11-14 DIAGNOSIS — E781 Pure hyperglyceridemia: Secondary | ICD-10-CM | POA: Diagnosis not present

## 2015-12-12 ENCOUNTER — Encounter: Payer: Self-pay | Admitting: Family

## 2015-12-19 ENCOUNTER — Ambulatory Visit: Payer: Medicare HMO | Admitting: Family

## 2015-12-19 ENCOUNTER — Encounter (HOSPITAL_COMMUNITY): Payer: Medicare HMO

## 2015-12-22 ENCOUNTER — Ambulatory Visit: Payer: Medicare HMO | Admitting: Family

## 2015-12-22 ENCOUNTER — Encounter (HOSPITAL_COMMUNITY): Payer: Commercial Managed Care - HMO

## 2015-12-23 DIAGNOSIS — Z23 Encounter for immunization: Secondary | ICD-10-CM | POA: Diagnosis not present

## 2016-02-14 ENCOUNTER — Encounter: Payer: Self-pay | Admitting: Family

## 2016-02-22 ENCOUNTER — Ambulatory Visit (INDEPENDENT_AMBULATORY_CARE_PROVIDER_SITE_OTHER): Payer: Commercial Managed Care - HMO | Admitting: Family

## 2016-02-22 ENCOUNTER — Ambulatory Visit (HOSPITAL_COMMUNITY)
Admission: RE | Admit: 2016-02-22 | Discharge: 2016-02-22 | Disposition: A | Discharge status: Home / Self Care | Payer: Commercial Managed Care - HMO | Source: Ambulatory Visit | Attending: Family | Admitting: Family

## 2016-02-22 ENCOUNTER — Encounter: Payer: Self-pay | Admitting: Family

## 2016-02-22 VITALS — BP 148/81 | HR 75 | Ht 69.0 in | Wt 199.0 lb

## 2016-02-22 DIAGNOSIS — I6521 Occlusion and stenosis of right carotid artery: Secondary | ICD-10-CM | POA: Insufficient documentation

## 2016-02-22 DIAGNOSIS — I129 Hypertensive chronic kidney disease with stage 1 through stage 4 chronic kidney disease, or unspecified chronic kidney disease: Secondary | ICD-10-CM | POA: Insufficient documentation

## 2016-02-22 DIAGNOSIS — Z9889 Other specified postprocedural states: Secondary | ICD-10-CM

## 2016-02-22 DIAGNOSIS — Z48812 Encounter for surgical aftercare following surgery on the circulatory system: Secondary | ICD-10-CM | POA: Insufficient documentation

## 2016-02-22 DIAGNOSIS — K219 Gastro-esophageal reflux disease without esophagitis: Secondary | ICD-10-CM | POA: Diagnosis not present

## 2016-02-22 DIAGNOSIS — I6523 Occlusion and stenosis of bilateral carotid arteries: Secondary | ICD-10-CM | POA: Diagnosis not present

## 2016-02-22 DIAGNOSIS — Z87891 Personal history of nicotine dependence: Secondary | ICD-10-CM | POA: Insufficient documentation

## 2016-02-22 DIAGNOSIS — N189 Chronic kidney disease, unspecified: Secondary | ICD-10-CM | POA: Diagnosis not present

## 2016-02-22 DIAGNOSIS — E785 Hyperlipidemia, unspecified: Secondary | ICD-10-CM | POA: Insufficient documentation

## 2016-02-22 DIAGNOSIS — E1122 Type 2 diabetes mellitus with diabetic chronic kidney disease: Secondary | ICD-10-CM | POA: Diagnosis not present

## 2016-02-22 NOTE — Patient Instructions (Signed)
Stroke Prevention Some medical conditions and behaviors are associated with an increased chance of having a stroke. You may prevent a stroke by making healthy choices and managing medical conditions. HOW CAN I REDUCE MY RISK OF HAVING A STROKE?   Stay physically active. Get at least 30 minutes of activity on most or all days.  Do not smoke. It may also be helpful to avoid exposure to secondhand smoke.  Limit alcohol use. Moderate alcohol use is considered to be:  No more than 2 drinks per day for men.  No more than 1 drink per day for nonpregnant women.  Eat healthy foods. This involves:  Eating 5 or more servings of fruits and vegetables a day.  Making dietary changes that address high blood pressure (hypertension), high cholesterol, diabetes, or obesity.  Manage your cholesterol levels.  Making food choices that are high in fiber and low in saturated fat, trans fat, and cholesterol may control cholesterol levels.  Take any prescribed medicines to control cholesterol as directed by your health care provider.  Manage your diabetes.  Controlling your carbohydrate and sugar intake is recommended to manage diabetes.  Take any prescribed medicines to control diabetes as directed by your health care provider.  Control your hypertension.  Making food choices that are low in salt (sodium), saturated fat, trans fat, and cholesterol is recommended to manage hypertension.  Ask your health care provider if you need treatment to lower your blood pressure. Take any prescribed medicines to control hypertension as directed by your health care provider.  If you are 18-39 years of age, have your blood pressure checked every 3-5 years. If you are 40 years of age or older, have your blood pressure checked every year.  Maintain a healthy weight.  Reducing calorie intake and making food choices that are low in sodium, saturated fat, trans fat, and cholesterol are recommended to manage  weight.  Stop drug abuse.  Avoid taking birth control pills.  Talk to your health care provider about the risks of taking birth control pills if you are over 35 years old, smoke, get migraines, or have ever had a blood clot.  Get evaluated for sleep disorders (sleep apnea).  Talk to your health care provider about getting a sleep evaluation if you snore a lot or have excessive sleepiness.  Take medicines only as directed by your health care provider.  For some people, aspirin or blood thinners (anticoagulants) are helpful in reducing the risk of forming abnormal blood clots that can lead to stroke. If you have the irregular heart rhythm of atrial fibrillation, you should be on a blood thinner unless there is a good reason you cannot take them.  Understand all your medicine instructions.  Make sure that other conditions (such as anemia or atherosclerosis) are addressed. SEEK IMMEDIATE MEDICAL CARE IF:   You have sudden weakness or numbness of the face, arm, or leg, especially on one side of the body.  Your face or eyelid droops to one side.  You have sudden confusion.  You have trouble speaking (aphasia) or understanding.  You have sudden trouble seeing in one or both eyes.  You have sudden trouble walking.  You have dizziness.  You have a loss of balance or coordination.  You have a sudden, severe headache with no known cause.  You have new chest pain or an irregular heartbeat. Any of these symptoms may represent a serious problem that is an emergency. Do not wait to see if the symptoms will   go away. Get medical help at once. Call your local emergency services (911 in [place] Do not drive yourself to the hospital.   This information is not intended to replace advice given to you by your health care provider. Make sure you discuss any questions you have with your health care provider.   Document Released: 11/28/2004 Document Revised: 11/11/2014 Document Reviewed:  04/23/2013 Elsevier Interactive Patient Education 2016 Elsevier Inc.  

## 2016-02-22 NOTE — Progress Notes (Signed)
Chief Complaint: Extracranial Carotid Artery Stenosis   History of Present Illness  Matthew Bates is a 70 y.o. male patient of Dr. Bridgett Larsson who underwent a left CEA in July 2012.  He returns today for carotid artery surveillance and evaluation.   He denies any history of TIA or stroke symptoms.Specifically he denies a history of amaurosis fugax or monocular blindness, unilateral facial drooping, hemiparesis, or receptive or expressive aphasia.   He denies any claudication in his legs with walking, denies non healing wounds.  He states he will resume walking on his treadmill 30 minutes/day, 5-7 days/week since tax season is over.   He states his cholesterol is great other than low HDL and elevated triglycerides. Sees a cardiologist for CAD who prescribes 2 beta blockers for patient which patient states works well for him.  Pt Diabetic: Yes, states his last A1C was 7.?, he admits to dietary indiscretions but he is now back on track since the tax season is over (he is a Engineer, maintenance ([employer])) Pt smoker: former smoker, quit 1972  Pt meds include: Statin : Yes ASA: Yes Other anticoagulants/antiplatelets: no    Past Medical History  Diagnosis Date  . Carotid artery bruit   . DM (diabetes mellitus) ([place])   . CAD (coronary artery disease)   . HTN (hypertension)   . Hyperlipemia   . GERD (gastroesophageal reflux disease)   . Gout   . Anemia   . Obesity   . High triglycerides   . Carotid artery occlusion   . Chronic kidney disease     Social History Social History  Substance Use Topics  . Smoking status: Former Smoker    Types: Cigarettes    Quit date: 11/04/1970  . Smokeless tobacco: Never Used  . Alcohol Use: Yes     Comment: rare    Family History Family History  Problem Relation Age of Onset  . Coronary artery disease    . Coronary artery disease Father   . Heart attack Father   . Hyperlipidemia Father   . Hypertension Father   . Heart disease Father     Heart Disease before  age 6- PVD  . Diabetes Brother   . Heart disease Brother     Heart Disese before age 39  . Hyperlipidemia Brother   . Hypertension Brother   . Cancer Brother     Lukemia  . Colon cancer Neg Hx   . Heart disease Mother     Heart Disease before age 69  . Hyperlipidemia Mother   . Hypertension Mother   . Heart attack Mother     Surgical History Past Surgical History  Procedure Laterality Date  . Angioplasty    . Appendectomy    . Cataract extraction    . Carotid endarterectomy  05/22/11    Left CEA  . Knee surgery      right  . Eye surgery    . Pr vein bypass graft,aorto-fem-pop  2010  . Spleen surgery    . Colonoscopy  10-09-13    Allergies  Allergen Reactions  . Metoclopramide Hcl Hypertension    Reglan=Increases HBP symptoms    Current Outpatient Prescriptions  Medication Sig Dispense Refill  . amLODipine (NORVASC) 5 MG tablet TAKE 1 TABLET BY MOUTH TWICE A DAY 60 tablet 0  . aspirin 325 MG tablet Take 325 mg by mouth daily.      . cyanocobalamin (,VITAMIN B-12,) 1000 MCG/ML injection Inject 1 mL (1,000 mcg total) into the muscle every  30 (thirty) days. 1 mL 0  . fenofibrate 160 MG tablet Take 1 tablet (160 mg total) by mouth daily. 30 tablet 5  . folic acid (FOLVITE) 1 MG tablet Take 1 mg by mouth daily.      . furosemide (LASIX) 40 MG tablet Take 20 mg by mouth daily.     Marland Kitchen labetalol (NORMODYNE) 200 MG tablet TAKE 1 TABLET BY MOUTH TWICE A DAY 60 tablet 0  . Lansoprazole (PREVACID PO) Take 20 mg by mouth daily.     Marland Kitchen losartan (COZAAR) 100 MG tablet Take 100 mg by mouth daily.     . MECLIZINE HCL PO Take by mouth as needed.      . metFORMIN (GLUCOPHAGE) 1000 MG tablet Take 1,000 mg by mouth 2 (two) times daily with a meal.     . metoprolol succinate (TOPROL-XL) 25 MG 24 hr tablet Take 50 mg by mouth daily.     . metoprolol succinate (TOPROL-XL) 50 MG 24 hr tablet Take 50 mg by mouth daily.  3  . Multiple Vitamin (MULTIVITAMIN) capsule Take 1 capsule by mouth  daily.      . Omega-3 Fatty Acids (EQL FISH OIL) 1000 MG CAPS Take 2 capsules (2,000 mg total) by mouth 2 (two) times daily. 90 each   . rosuvastatin (CRESTOR) 20 MG tablet Take 20 mg by mouth daily.  1  . simvastatin (ZOCOR) 40 MG tablet Take 40 mg by mouth at bedtime. Reported on 02/22/2016     No current facility-administered medications for this visit.    Review of Systems : See HPI for pertinent positives and negatives.  Physical Examination  Filed Vitals:   02/22/16 0921 02/22/16 0922  BP: 132/77 148/81  Pulse: 75   Height: 5\' 9"  (1.753 m)   Weight: 199 lb (90.266 kg)   SpO2: 96%    Body mass index is 29.37 kg/(m^2).  General: WDWN male in NAD GAIT: normal Eyes: PERRLA Pulmonary: Non labored respirations, CTAB Cardiac: regular rhythm, no detected murmur.  VASCULAR EXAM Carotid Bruits Left Right   Negative Negative   Aorta is not palpable. Radial pulses are 2+ palpable and equal.      LE Pulses LEFT RIGHT   POPLITEAL not palpable  not palpable   POSTERIOR TIBIAL  palpable   palpable    DORSALIS PEDIS  ANTERIOR TIBIAL not palpable  palpable     Gastrointestinal: soft, nontender, BS WNL, no r/g, no palpable masses.  Musculoskeletal: No muscle atrophy/wasting. M/S 5/5 throughout, Extremities without ischemic changes. Lower legs with 1+ pitting edema, no chronic venous insufficiency changes.  Neurologic: A&O X 3; Appropriate Affect ; SENSATION ;normal;  Speech is normal CN 2-12 intact, Pain and light touch intact in extremities, Motor exam as listed above.                Non-Invasive Vascular Imaging CAROTID DUPLEX 02/22/2016   CEREBROVASCULAR DUPLEX EVALUATION    Carotid artery disease    PREVIOUS INTERVENTION(S): Left carotid endarterectomy 05/22/2011     Carotid duplex    RIGHT  LEFT  End Diastolic Velocities (cm/s) Plaque LOCATION Peak Systolic Velocities (cm/s) End Diastolic Velocities (cm/s) Plaque  13  CCA PROXIMAL 129 20   19  CCA MID 101 19   18 HT CCA DISTAL 94 16   20  ECA 206 20   56 HT ICA PROXIMAL 106 24 HM  32  ICA MID 87 19   20  ICA DISTAL 99 28  2.2 ICA / CCA Ratio (PSV) N/A  Retrograde Vertebral Flow Antegrade  [id number] Brachial Systolic Pressure (mmHg) 0000000  Triphasic Brachial Artery Waveforms Triphasic    Plaque Morphology:  HM = Homogeneous, HT = Heterogeneous, CP = Calcific Plaque, SP = Smooth Plaque, IP = Irregular Plaque     ADDITIONAL FINDINGS: Right subclavian 177 cm/s monophasic, left 130 cm/s triphasic    1. 40 - 59% right internal carotid artery stenosis 2. Patent left carotid endarterectomy site with no evidence for restenosis    Compared to the previous exam:  No significant change of internal carotid arteries since exam of 12/15/2014     Assessment: NAAZIR FORSHAY is a 70 y.o. male who is s/p left CEA in July 2012. He presents with asymptomatic 40%-59% right ICA stenosis and patent left ICA with history of carotid endarterectomy, no hyperplasia or hemodynamically significant plaque present. Unchanged since previous study on 10/15/2013 and 12/15/14.  Plan: Follow-up in 1 year with Carotid Duplex scan.   I discussed in depth with the patient the nature of atherosclerosis, and emphasized the importance of maximal medical management including strict control of blood pressure, blood glucose, and lipid levels, obtaining regular exercise, and continued cessation of smoking.  The patient is aware that without maximal medical management the underlying atherosclerotic disease process will progress, limiting the benefit of any interventions. The patient was given information about stroke prevention and what symptoms should prompt the patient to seek immediate medical care. Thank you for allowing [place] to participate  in this patient's care.  Clemon Chambers, RN, MSN, FNP-C [facility] Office: [phone]  Clinic Physician: Oneida Alar  02/22/2016 9:52 AM

## 2016-03-07 DIAGNOSIS — E1149 Type 2 diabetes mellitus with other diabetic neurological complication: Secondary | ICD-10-CM | POA: Diagnosis not present

## 2016-03-07 DIAGNOSIS — Z9861 Coronary angioplasty status: Secondary | ICD-10-CM | POA: Diagnosis not present

## 2016-03-07 DIAGNOSIS — L989 Disorder of the skin and subcutaneous tissue, unspecified: Secondary | ICD-10-CM | POA: Diagnosis not present

## 2016-03-07 DIAGNOSIS — Z1389 Encounter for screening for other disorder: Secondary | ICD-10-CM | POA: Diagnosis not present

## 2016-03-07 DIAGNOSIS — I6523 Occlusion and stenosis of bilateral carotid arteries: Secondary | ICD-10-CM | POA: Diagnosis not present

## 2016-03-07 DIAGNOSIS — E784 Other hyperlipidemia: Secondary | ICD-10-CM | POA: Diagnosis not present

## 2016-03-07 DIAGNOSIS — Z6830 Body mass index (BMI) 30.0-30.9, adult: Secondary | ICD-10-CM | POA: Diagnosis not present

## 2016-03-07 DIAGNOSIS — Z955 Presence of coronary angioplasty implant and graft: Secondary | ICD-10-CM | POA: Diagnosis not present

## 2016-03-07 DIAGNOSIS — I10 Essential (primary) hypertension: Secondary | ICD-10-CM | POA: Diagnosis not present

## 2016-03-29 NOTE — Addendum Note (Signed)
Addended by: Mena Goes on: 03/29/2016 01:10 PM   Modules accepted: Orders

## 2016-04-09 DIAGNOSIS — E538 Deficiency of other specified B group vitamins: Secondary | ICD-10-CM | POA: Diagnosis not present

## 2016-04-22 DIAGNOSIS — L570 Actinic keratosis: Secondary | ICD-10-CM | POA: Diagnosis not present

## 2016-04-22 DIAGNOSIS — C44719 Basal cell carcinoma of skin of left lower limb, including hip: Secondary | ICD-10-CM | POA: Diagnosis not present

## 2016-04-22 DIAGNOSIS — D2239 Melanocytic nevi of other parts of face: Secondary | ICD-10-CM | POA: Diagnosis not present

## 2016-04-22 DIAGNOSIS — D225 Melanocytic nevi of trunk: Secondary | ICD-10-CM | POA: Diagnosis not present

## 2016-04-22 DIAGNOSIS — Z85828 Personal history of other malignant neoplasm of skin: Secondary | ICD-10-CM | POA: Diagnosis not present

## 2016-04-22 DIAGNOSIS — D1801 Hemangioma of skin and subcutaneous tissue: Secondary | ICD-10-CM | POA: Diagnosis not present

## 2016-04-22 DIAGNOSIS — L821 Other seborrheic keratosis: Secondary | ICD-10-CM | POA: Diagnosis not present

## 2016-06-27 DIAGNOSIS — H35033 Hypertensive retinopathy, bilateral: Secondary | ICD-10-CM | POA: Diagnosis not present

## 2016-06-27 DIAGNOSIS — H26491 Other secondary cataract, right eye: Secondary | ICD-10-CM | POA: Diagnosis not present

## 2016-06-27 DIAGNOSIS — E119 Type 2 diabetes mellitus without complications: Secondary | ICD-10-CM | POA: Diagnosis not present

## 2016-06-27 DIAGNOSIS — H26492 Other secondary cataract, left eye: Secondary | ICD-10-CM | POA: Diagnosis not present

## 2016-06-27 DIAGNOSIS — H26493 Other secondary cataract, bilateral: Secondary | ICD-10-CM | POA: Diagnosis not present

## 2016-12-13 DIAGNOSIS — Z23 Encounter for immunization: Secondary | ICD-10-CM | POA: Diagnosis not present

## 2017-01-03 DIAGNOSIS — Z125 Encounter for screening for malignant neoplasm of prostate: Secondary | ICD-10-CM | POA: Diagnosis not present

## 2017-01-03 DIAGNOSIS — I10 Essential (primary) hypertension: Secondary | ICD-10-CM | POA: Diagnosis not present

## 2017-01-03 DIAGNOSIS — M109 Gout, unspecified: Secondary | ICD-10-CM | POA: Diagnosis not present

## 2017-01-03 DIAGNOSIS — E1149 Type 2 diabetes mellitus with other diabetic neurological complication: Secondary | ICD-10-CM | POA: Diagnosis not present

## 2017-01-03 DIAGNOSIS — E784 Other hyperlipidemia: Secondary | ICD-10-CM | POA: Diagnosis not present

## 2017-01-03 DIAGNOSIS — E538 Deficiency of other specified B group vitamins: Secondary | ICD-10-CM | POA: Diagnosis not present

## 2017-01-10 DIAGNOSIS — N183 Chronic kidney disease, stage 3 (moderate): Secondary | ICD-10-CM | POA: Diagnosis not present

## 2017-01-10 DIAGNOSIS — Z0000 Encounter for general adult medical examination without abnormal findings: Secondary | ICD-10-CM | POA: Diagnosis not present

## 2017-01-10 DIAGNOSIS — E1129 Type 2 diabetes mellitus with other diabetic kidney complication: Secondary | ICD-10-CM | POA: Diagnosis not present

## 2017-01-10 DIAGNOSIS — Z9861 Coronary angioplasty status: Secondary | ICD-10-CM | POA: Diagnosis not present

## 2017-01-10 DIAGNOSIS — E1149 Type 2 diabetes mellitus with other diabetic neurological complication: Secondary | ICD-10-CM | POA: Diagnosis not present

## 2017-01-10 DIAGNOSIS — E781 Pure hyperglyceridemia: Secondary | ICD-10-CM | POA: Diagnosis not present

## 2017-01-10 DIAGNOSIS — G6289 Other specified polyneuropathies: Secondary | ICD-10-CM | POA: Diagnosis not present

## 2017-01-10 DIAGNOSIS — E784 Other hyperlipidemia: Secondary | ICD-10-CM | POA: Diagnosis not present

## 2017-01-10 DIAGNOSIS — I10 Essential (primary) hypertension: Secondary | ICD-10-CM | POA: Diagnosis not present

## 2017-01-16 DIAGNOSIS — Z1212 Encounter for screening for malignant neoplasm of rectum: Secondary | ICD-10-CM | POA: Diagnosis not present

## 2017-02-19 ENCOUNTER — Encounter: Payer: Self-pay | Admitting: Family

## 2017-02-27 ENCOUNTER — Ambulatory Visit (INDEPENDENT_AMBULATORY_CARE_PROVIDER_SITE_OTHER): Payer: Medicare HMO | Admitting: Family

## 2017-02-27 ENCOUNTER — Ambulatory Visit (HOSPITAL_COMMUNITY)
Admission: RE | Admit: 2017-02-27 | Discharge: 2017-02-27 | Disposition: A | Discharge status: Home / Self Care | Payer: Medicare HMO | Source: Ambulatory Visit | Attending: Family | Admitting: Family

## 2017-02-27 ENCOUNTER — Encounter: Payer: Self-pay | Admitting: Family

## 2017-02-27 VITALS — BP 119/77 | HR 78 | Temp 98.8°F | Resp 16 | Ht 69.0 in | Wt 193.0 lb

## 2017-02-27 DIAGNOSIS — Z87891 Personal history of nicotine dependence: Secondary | ICD-10-CM

## 2017-02-27 DIAGNOSIS — Z48812 Encounter for surgical aftercare following surgery on the circulatory system: Secondary | ICD-10-CM | POA: Diagnosis not present

## 2017-02-27 DIAGNOSIS — I6523 Occlusion and stenosis of bilateral carotid arteries: Secondary | ICD-10-CM

## 2017-02-27 DIAGNOSIS — Z9889 Other specified postprocedural states: Secondary | ICD-10-CM

## 2017-02-27 LAB — VAS US CAROTID
LCCAPDIAS: 16 cm/s
LCCAPSYS: 92 cm/s
LEFT ECA DIAS: -24 cm/s
LEFT VERTEBRAL DIAS: -10 cm/s
LICADDIAS: -20 cm/s
LICADSYS: -62 cm/s
LICAPSYS: -110 cm/s
Left CCA dist dias: 21 cm/s
Left CCA dist sys: 95 cm/s
Left ICA prox dias: -21 cm/s
RCCAPSYS: 105 cm/s
RIGHT CCA MID DIAS: 17 cm/s
RIGHT ECA DIAS: -14 cm/s
Right CCA prox dias: 18 cm/s
Right cca dist sys: -95 cm/s

## 2017-02-27 NOTE — Patient Instructions (Signed)
Stroke Prevention Some medical conditions and behaviors are associated with an increased chance of having a stroke. You may prevent a stroke by making healthy choices and managing medical conditions. How can I reduce my risk of having a stroke?  Stay physically active. Get at least 30 minutes of activity on most or all days.  Do not smoke. It may also be helpful to avoid exposure to secondhand smoke.  Limit alcohol use. Moderate alcohol use is considered to be:  No more than 2 drinks per day for men.  No more than 1 drink per day for nonpregnant women.  Eat healthy foods. This involves:  Eating 5 or more servings of fruits and vegetables a day.  Making dietary changes that address high blood pressure (hypertension), high cholesterol, diabetes, or obesity.  Manage your cholesterol levels.  Making food choices that are high in fiber and low in saturated fat, trans fat, and cholesterol may control cholesterol levels.  Take any prescribed medicines to control cholesterol as directed by your health care provider.  Manage your diabetes.  Controlling your carbohydrate and sugar intake is recommended to manage diabetes.  Take any prescribed medicines to control diabetes as directed by your health care provider.  Control your hypertension.  Making food choices that are low in salt (sodium), saturated fat, trans fat, and cholesterol is recommended to manage hypertension.  Ask your health care provider if you need treatment to lower your blood pressure. Take any prescribed medicines to control hypertension as directed by your health care provider.  If you are 18-39 years of age, have your blood pressure checked every 3-5 years. If you are 40 years of age or older, have your blood pressure checked every year.  Maintain a healthy weight.  Reducing calorie intake and making food choices that are low in sodium, saturated fat, trans fat, and cholesterol are recommended to manage  weight.  Stop drug abuse.  Avoid taking birth control pills.  Talk to your health care provider about the risks of taking birth control pills if you are over 35 years old, smoke, get migraines, or have ever had a blood clot.  Get evaluated for sleep disorders (sleep apnea).  Talk to your health care provider about getting a sleep evaluation if you snore a lot or have excessive sleepiness.  Take medicines only as directed by your health care provider.  For some people, aspirin or blood thinners (anticoagulants) are helpful in reducing the risk of forming abnormal blood clots that can lead to stroke. If you have the irregular heart rhythm of atrial fibrillation, you should be on a blood thinner unless there is a good reason you cannot take them.  Understand all your medicine instructions.  Make sure that other conditions (such as anemia or atherosclerosis) are addressed. Get help right away if:  You have sudden weakness or numbness of the face, arm, or leg, especially on one side of the body.  Your face or eyelid droops to one side.  You have sudden confusion.  You have trouble speaking (aphasia) or understanding.  You have sudden trouble seeing in one or both eyes.  You have sudden trouble walking.  You have dizziness.  You have a loss of balance or coordination.  You have a sudden, severe headache with no known cause.  You have new chest pain or an irregular heartbeat. Any of these symptoms may represent a serious problem that is an emergency. Do not wait to see if the symptoms will go away.   Get medical help at once. Call your local emergency services (911 in [place] Do not drive yourself to the hospital. This information is not intended to replace advice given to you by your health care provider. Make sure you discuss any questions you have with your health care provider. Document Released: 11/28/2004 Document Revised: 03/28/2016 Document Reviewed: 04/23/2013 Elsevier  Interactive Patient Education  2017 Elsevier Inc.      Preventing Cerebrovascular Disease Arteries are blood vessels that carry blood that contains oxygen from the heart to all parts of the body. Cerebrovascular disease affects arteries that supply the brain. Any condition that blocks or disrupts blood flow to the brain can cause cerebrovascular disease. Brain cells that lose blood supply start to die within minutes (stroke). Stroke is the main danger of cerebrovascular disease. Atherosclerosis and high blood pressure are common causes of cerebrovascular disease. Atherosclerosis is narrowing and hardening of an artery that results when fat, cholesterol, calcium, or other substances (plaque) build up inside an artery. Plaque reduces blood flow through the artery. High blood pressure increases the risk of bleeding inside the brain. Making diet and lifestyle changes to prevent atherosclerosis and high blood pressure lowers your risk of cerebrovascular disease. What nutrition changes can be made?  Eat more fruits, vegetables, and whole grains.  Reduce how much saturated fat you eat. To do this, eat less red meat and fewer full-fat dairy products.  Eat healthy proteins instead of red meat. Healthy proteins include:  Fish. Eat fish that contains heart-healthy omega-3 fatty acids, twice a week. Examples include salmon, albacore tuna, mackerel, and herring.  Chicken.  Nuts.  Low-fat or nonfat yogurt.  Avoid processed meats, like bacon and lunchmeat.  Avoid foods that contain:  A lot of sugar, such as sweets and drinks with added sugar.  A lot of salt (sodium). Avoid adding extra salt to your food, as told by your health care provider.  Trans fats, such as margarine and baked goods. Trans fats may be listed as "partially hydrogenated oils" on food labels.  Check food labels to see how much sodium, sugar, and trans fats are in foods.  Use vegetable oils that contain low amounts of  saturated fat, such as olive oil or canola oil. What lifestyle changes can be made?  Drink alcohol in moderation. This means no more than 1 drink a day for nonpregnant women and 2 drinks a day for men. One drink equals 12 oz of beer, 5 oz of wine, or 1 oz of hard liquor.  If you are overweight, ask your health care provider to recommend a weight-loss plan for you. Losing 5-10 lb (2.2-4.5 kg) can reduce your risk of diabetes, atherosclerosis, and high blood pressure.  Exercise for 30?60 minutes on most days, or as much as told by your health care provider.  Do moderate-intensity exercise, such as brisk walking, bicycling, and water aerobics. Ask your health care provider which activities are safe for you.  Do not use any products that contain nicotine or tobacco, such as cigarettes and e-cigarettes. If you need help quitting, ask your health care provider. Why are these changes important? Making these changes lowers your risk of many diseases that can cause cerebrovascular disease and stroke. Stroke is a leading cause of death and disability. Making these changes also improves your overall health and quality of life. What can I do to lower my risk? The following factors make you more likely to develop cerebrovascular disease:  Being overweight.  Smoking.  Being physically   inactive.  Eating a high-fat diet.  Having certain health conditions, such as:  Diabetes.  High blood pressure.  Heart disease.  Atherosclerosis.  High cholesterol.  Sickle cell disease. Talk with your health care provider about your risk for cerebrovascular disease. Work with your health care provider to control diseases that you have that may contribute to cerebrovascular disease. Your health care provider may prescribe medicines to help prevent major causes of cerebrovascular disease. Where to find more information: Learn more about preventing cerebrovascular disease from:  National Heart, Lung, and  Blood Institute: www.nhlbi.nih.gov/health/health-topics/topics/stroke  Centers for Disease Control and Prevention: cdc.gov/stroke/about.htm Summary  Cerebrovascular disease can lead to a stroke.  Atherosclerosis and high blood pressure are major causes of cerebrovascular disease.  Making diet and lifestyle changes can reduce your risk of cerebrovascular disease.  Work with your health care provider to get your risk factors under control to reduce your risk of cerebrovascular disease. This information is not intended to replace advice given to you by your health care provider. Make sure you discuss any questions you have with your health care provider. Document Released: 11/05/2015 Document Revised: 05/10/2016 Document Reviewed: 11/05/2015 Elsevier Interactive Patient Education  2017 Elsevier Inc.  

## 2017-02-27 NOTE — Progress Notes (Signed)
Chief Complaint: Follow up Extracranial Carotid Artery Stenosis   History of Present Illness  Matthew Bates is a 71 y.o. male patient of Dr. Bridgett Larsson who underwent a left CEA in July 2012.  He returns today for carotid artery surveillance and evaluation.   He denies any history of TIA or stroke symptoms.Specifically he denies a history of amaurosis fugax or monocular blindness, unilateral facial drooping, hemiparesis, or receptive or expressive aphasia.   He denies any claudication in his legs with walking, denies non healing wounds.  He states he will resume walking on his treadmill 30 minutes/day, 5-7 days/week once the tax season is over.   He states his cholesterol is great other than low HDL and elevated triglycerides. Sees a cardiologist for CAD who prescribes 2 beta blockers for patient which patient states works well for him.  Pt states the difference in his blood pressure are due to that he calms down after a while in a medical office.   Pt Diabetic: Yes, last A1C was 8.8 in March 2018 from his PCP's office (review of records), he admits to dietary indiscretions but he is now back on track since the tax season is over (he is a Engineer, maintenance ([employer])) Pt smoker: former smoker, quit 1972  Pt meds include: Statin : Yes ASA: Yes Other anticoagulants/antiplatelets: no   Past Medical History:  Diagnosis Date  . Anemia   . CAD (coronary artery disease)   . Carotid artery bruit   . Carotid artery occlusion   . Chronic kidney disease   . DM (diabetes mellitus) ([place])   . GERD (gastroesophageal reflux disease)   . Gout   . High triglycerides   . HTN (hypertension)   . Hyperlipemia   . Obesity     Social History Social History  Substance Use Topics  . Smoking status: Former Smoker    Types: Cigarettes    Quit date: 11/04/1970  . Smokeless tobacco: Never Used  . Alcohol use Yes     Comment: rare    Family History Family History  Problem Relation Age of Onset  . Coronary artery  disease    . Coronary artery disease Father   . Heart attack Father   . Hyperlipidemia Father   . Hypertension Father   . Heart disease Father     Heart Disease before age 23- PVD  . Diabetes Brother   . Heart disease Brother     Heart Disese before age 32  . Hyperlipidemia Brother   . Hypertension Brother   . Cancer Brother     Lukemia  . Colon cancer Neg Hx   . Heart disease Mother     Heart Disease before age 22  . Hyperlipidemia Mother   . Hypertension Mother   . Heart attack Mother     Surgical History Past Surgical History:  Procedure Laterality Date  . ANGIOPLASTY    . APPENDECTOMY    . CAROTID ENDARTERECTOMY  05/22/11   Left CEA  . CATARACT EXTRACTION    . COLONOSCOPY  10-09-13  . EYE SURGERY    . KNEE SURGERY     right  . PR VEIN BYPASS GRAFT,AORTO-FEM-POP  2010  . spleen surgery      Allergies  Allergen Reactions  . Metoclopramide Hcl Hypertension    Reglan=Increases HBP symptoms    Current Outpatient Prescriptions  Medication Sig Dispense Refill  . amLODipine (NORVASC) 5 MG tablet TAKE 1 TABLET BY MOUTH TWICE A DAY 60 tablet 0  .  aspirin 325 MG tablet Take 325 mg by mouth daily.      . cyanocobalamin (,VITAMIN B-12,) 1000 MCG/ML injection Inject 1 mL (1,000 mcg total) into the muscle every 30 (thirty) days. 1 mL 0  . fenofibrate 160 MG tablet Take 1 tablet (160 mg total) by mouth daily. 30 tablet 5  . folic acid (FOLVITE) 1 MG tablet Take 1 mg by mouth daily.      . furosemide (LASIX) 40 MG tablet Take 20 mg by mouth daily.     Marland Kitchen labetalol (NORMODYNE) 200 MG tablet TAKE 1 TABLET BY MOUTH TWICE A DAY 60 tablet 0  . Lansoprazole (PREVACID PO) Take 20 mg by mouth daily.     Marland Kitchen losartan (COZAAR) 100 MG tablet Take 100 mg by mouth daily.     . MECLIZINE HCL PO Take by mouth as needed.      . metFORMIN (GLUCOPHAGE) 1000 MG tablet Take 1,000 mg by mouth 2 (two) times daily with a meal.     . metoprolol succinate (TOPROL-XL) 25 MG 24 hr tablet Take 50 mg by  mouth daily.     . metoprolol succinate (TOPROL-XL) 50 MG 24 hr tablet Take 50 mg by mouth daily.  3  . Multiple Vitamin (MULTIVITAMIN) capsule Take 1 capsule by mouth daily.      . Omega-3 Fatty Acids (EQL FISH OIL) 1000 MG CAPS Take 2 capsules (2,000 mg total) by mouth 2 (two) times daily. 90 each   . rosuvastatin (CRESTOR) 20 MG tablet Take 20 mg by mouth daily.  1  . simvastatin (ZOCOR) 40 MG tablet Take 40 mg by mouth at bedtime. Reported on 02/22/2016     No current facility-administered medications for this visit.     Review of Systems : See HPI for pertinent positives and negatives.  Physical Examination  Vitals:   02/27/17 1427 02/27/17 1431  BP: (!) 151/72 108/80  Pulse: 78   Resp: 16   Temp: 98.8 F (37.1 C)   TempSrc: Oral   SpO2: 95%   Weight: 193 lb (87.5 kg)   Height: 5\' 9"  (1.753 m)    Body mass index is 28.5 kg/m.  General: WDWN male in NAD GAIT: normal Eyes: PERRLA Pulmonary: Non labored respirations, CTAB Cardiac: regular rhythm, no detected murmur.  VASCULAR EXAM Carotid Bruits Left Right   Negative Negative   Aorta is not palpable. Radial pulses are 2+ palpable and equal.      LE Pulses LEFT RIGHT   POPLITEAL not palpable  not palpable   POSTERIOR TIBIAL  palpable   palpable    DORSALIS PEDIS  ANTERIOR TIBIAL not palpable  palpable     Gastrointestinal: soft, nontender, BS WNL, no r/g, no palpable masses.  Musculoskeletal: No muscle atrophy/wasting. M/S 5/5 throughout, Extremities without ischemic changes. Lower legs with 1+ pitting edema, no chronic venous insufficiency changes.  Neurologic: A&O X 3; Appropriate Affect ; SENSATION ;normal;  Speech is normal CN 2-12 intact, Pain and light touch intact in extremities, Motor exam as listed  above.     Assessment: [name] is a 71 y.o. male who is s/p left CEA in July 2012. He has no history of stroke or TIA.   His atherosclerotic risk factors include uncontrolled DM, CAD, and hypertriglyceridemia.   DATA (02/27/17): Carotid Duplex: 40-59% right ICA stenosis No restenosis of left ICA (CEA site). Right vertebral artery flow is retrograde, left is antegrade. Right subclavian artery waveform is monophasic with 198 cm/s highest  velocity; left subclavian artery waveforms are triphasic, highest velocity at 124 cm/c. No significant change compared to the last exam on 02-22-16.    Plan:  Follow-up in 1 year with Carotid Duplex scan.        I discussed in depth with the patient the nature of atherosclerosis, and emphasized the importance of maximal medical management including strict control of blood pressure, blood glucose, and lipid levels, obtaining regular exercise, and continued cessation of smoking.  The patient is aware that without maximal medical management the underlying atherosclerotic disease process will progress, limiting the benefit of any interventions. The patient was given information about stroke prevention and what symptoms should prompt the patient to seek immediate medical care. Thank you for allowing [place] to participate in this patient's care.  Clemon Chambers, RN, MSN, FNP-C [facility] Office: [phone]  Clinic Physician: Bridgett Larsson on call  02/27/17 2:32 PM

## 2017-02-28 NOTE — Addendum Note (Signed)
Addended by: Lianne Cure A on: 02/28/2017 09:07 AM   Modules accepted: Orders

## 2017-06-10 DIAGNOSIS — E784 Other hyperlipidemia: Secondary | ICD-10-CM | POA: Diagnosis not present

## 2017-06-10 DIAGNOSIS — I10 Essential (primary) hypertension: Secondary | ICD-10-CM | POA: Diagnosis not present

## 2017-06-10 DIAGNOSIS — E1149 Type 2 diabetes mellitus with other diabetic neurological complication: Secondary | ICD-10-CM | POA: Diagnosis not present

## 2017-06-10 DIAGNOSIS — E1129 Type 2 diabetes mellitus with other diabetic kidney complication: Secondary | ICD-10-CM | POA: Diagnosis not present

## 2017-06-10 DIAGNOSIS — Z9861 Coronary angioplasty status: Secondary | ICD-10-CM | POA: Diagnosis not present

## 2017-06-10 DIAGNOSIS — E781 Pure hyperglyceridemia: Secondary | ICD-10-CM | POA: Diagnosis not present

## 2017-06-10 DIAGNOSIS — Z955 Presence of coronary angioplasty implant and graft: Secondary | ICD-10-CM | POA: Diagnosis not present

## 2017-06-10 DIAGNOSIS — E538 Deficiency of other specified B group vitamins: Secondary | ICD-10-CM | POA: Diagnosis not present

## 2017-06-10 DIAGNOSIS — M109 Gout, unspecified: Secondary | ICD-10-CM | POA: Diagnosis not present

## 2017-06-16 DIAGNOSIS — J029 Acute pharyngitis, unspecified: Secondary | ICD-10-CM | POA: Diagnosis not present

## 2017-07-15 DIAGNOSIS — C44619 Basal cell carcinoma of skin of left upper limb, including shoulder: Secondary | ICD-10-CM | POA: Diagnosis not present

## 2017-07-15 DIAGNOSIS — D225 Melanocytic nevi of trunk: Secondary | ICD-10-CM | POA: Diagnosis not present

## 2017-07-15 DIAGNOSIS — D2239 Melanocytic nevi of other parts of face: Secondary | ICD-10-CM | POA: Diagnosis not present

## 2017-07-15 DIAGNOSIS — D485 Neoplasm of uncertain behavior of skin: Secondary | ICD-10-CM | POA: Diagnosis not present

## 2017-07-15 DIAGNOSIS — L570 Actinic keratosis: Secondary | ICD-10-CM | POA: Diagnosis not present

## 2017-07-15 DIAGNOSIS — Z85828 Personal history of other malignant neoplasm of skin: Secondary | ICD-10-CM | POA: Diagnosis not present

## 2017-07-15 DIAGNOSIS — L821 Other seborrheic keratosis: Secondary | ICD-10-CM | POA: Diagnosis not present

## 2017-07-15 DIAGNOSIS — D2371 Other benign neoplasm of skin of right lower limb, including hip: Secondary | ICD-10-CM | POA: Diagnosis not present

## 2017-07-15 DIAGNOSIS — C44712 Basal cell carcinoma of skin of right lower limb, including hip: Secondary | ICD-10-CM | POA: Diagnosis not present

## 2017-07-15 DIAGNOSIS — D1801 Hemangioma of skin and subcutaneous tissue: Secondary | ICD-10-CM | POA: Diagnosis not present

## 2017-07-15 DIAGNOSIS — L905 Scar conditions and fibrosis of skin: Secondary | ICD-10-CM | POA: Diagnosis not present

## 2017-08-01 DIAGNOSIS — E119 Type 2 diabetes mellitus without complications: Secondary | ICD-10-CM | POA: Diagnosis not present

## 2017-08-01 DIAGNOSIS — H26493 Other secondary cataract, bilateral: Secondary | ICD-10-CM | POA: Diagnosis not present

## 2017-08-01 DIAGNOSIS — H35033 Hypertensive retinopathy, bilateral: Secondary | ICD-10-CM | POA: Diagnosis not present

## 2017-08-01 DIAGNOSIS — I7090 Unspecified atherosclerosis: Secondary | ICD-10-CM | POA: Diagnosis not present

## 2017-09-02 DIAGNOSIS — E538 Deficiency of other specified B group vitamins: Secondary | ICD-10-CM | POA: Diagnosis not present

## 2017-11-11 DIAGNOSIS — L821 Other seborrheic keratosis: Secondary | ICD-10-CM | POA: Diagnosis not present

## 2017-11-11 DIAGNOSIS — L570 Actinic keratosis: Secondary | ICD-10-CM | POA: Diagnosis not present

## 2017-11-11 DIAGNOSIS — D485 Neoplasm of uncertain behavior of skin: Secondary | ICD-10-CM | POA: Diagnosis not present

## 2017-11-11 DIAGNOSIS — L905 Scar conditions and fibrosis of skin: Secondary | ICD-10-CM | POA: Diagnosis not present

## 2017-11-11 DIAGNOSIS — D225 Melanocytic nevi of trunk: Secondary | ICD-10-CM | POA: Diagnosis not present

## 2017-11-11 DIAGNOSIS — L738 Other specified follicular disorders: Secondary | ICD-10-CM | POA: Diagnosis not present

## 2017-11-11 DIAGNOSIS — Z85828 Personal history of other malignant neoplasm of skin: Secondary | ICD-10-CM | POA: Diagnosis not present

## 2017-11-11 DIAGNOSIS — D1801 Hemangioma of skin and subcutaneous tissue: Secondary | ICD-10-CM | POA: Diagnosis not present

## 2017-12-02 DIAGNOSIS — E538 Deficiency of other specified B group vitamins: Secondary | ICD-10-CM | POA: Diagnosis not present

## 2017-12-17 DIAGNOSIS — G6289 Other specified polyneuropathies: Secondary | ICD-10-CM | POA: Diagnosis not present

## 2017-12-17 DIAGNOSIS — E1149 Type 2 diabetes mellitus with other diabetic neurological complication: Secondary | ICD-10-CM | POA: Diagnosis not present

## 2017-12-17 DIAGNOSIS — N183 Chronic kidney disease, stage 3 (moderate): Secondary | ICD-10-CM | POA: Diagnosis not present

## 2017-12-17 DIAGNOSIS — Z6828 Body mass index (BMI) 28.0-28.9, adult: Secondary | ICD-10-CM | POA: Diagnosis not present

## 2017-12-17 DIAGNOSIS — E669 Obesity, unspecified: Secondary | ICD-10-CM | POA: Diagnosis not present

## 2017-12-17 DIAGNOSIS — I10 Essential (primary) hypertension: Secondary | ICD-10-CM | POA: Diagnosis not present

## 2017-12-17 DIAGNOSIS — E1129 Type 2 diabetes mellitus with other diabetic kidney complication: Secondary | ICD-10-CM | POA: Diagnosis not present

## 2017-12-17 DIAGNOSIS — E7849 Other hyperlipidemia: Secondary | ICD-10-CM | POA: Diagnosis not present

## 2017-12-17 DIAGNOSIS — M1000 Idiopathic gout, unspecified site: Secondary | ICD-10-CM | POA: Diagnosis not present

## 2018-03-03 ENCOUNTER — Ambulatory Visit: Payer: Medicare HMO | Admitting: Family

## 2018-03-03 ENCOUNTER — Ambulatory Visit (HOSPITAL_COMMUNITY): Admission: RE | Admit: 2018-03-03 | Payer: Medicare HMO | Source: Ambulatory Visit

## 2018-04-11 DIAGNOSIS — B029 Zoster without complications: Secondary | ICD-10-CM | POA: Diagnosis not present

## 2018-04-30 ENCOUNTER — Encounter (HOSPITAL_COMMUNITY): Payer: Medicare HMO

## 2018-04-30 ENCOUNTER — Ambulatory Visit: Payer: Medicare HMO | Admitting: Family

## 2018-06-03 DIAGNOSIS — Z125 Encounter for screening for malignant neoplasm of prostate: Secondary | ICD-10-CM | POA: Diagnosis not present

## 2018-06-03 DIAGNOSIS — E1129 Type 2 diabetes mellitus with other diabetic kidney complication: Secondary | ICD-10-CM | POA: Diagnosis not present

## 2018-06-03 DIAGNOSIS — I10 Essential (primary) hypertension: Secondary | ICD-10-CM | POA: Diagnosis not present

## 2018-06-03 DIAGNOSIS — E538 Deficiency of other specified B group vitamins: Secondary | ICD-10-CM | POA: Diagnosis not present

## 2018-06-03 DIAGNOSIS — E7849 Other hyperlipidemia: Secondary | ICD-10-CM | POA: Diagnosis not present

## 2018-06-03 DIAGNOSIS — M1000 Idiopathic gout, unspecified site: Secondary | ICD-10-CM | POA: Diagnosis not present

## 2018-06-03 DIAGNOSIS — R82998 Other abnormal findings in urine: Secondary | ICD-10-CM | POA: Diagnosis not present

## 2018-06-05 DIAGNOSIS — D225 Melanocytic nevi of trunk: Secondary | ICD-10-CM | POA: Diagnosis not present

## 2018-06-05 DIAGNOSIS — D2371 Other benign neoplasm of skin of right lower limb, including hip: Secondary | ICD-10-CM | POA: Diagnosis not present

## 2018-06-05 DIAGNOSIS — L821 Other seborrheic keratosis: Secondary | ICD-10-CM | POA: Diagnosis not present

## 2018-06-05 DIAGNOSIS — L570 Actinic keratosis: Secondary | ICD-10-CM | POA: Diagnosis not present

## 2018-06-05 DIAGNOSIS — L309 Dermatitis, unspecified: Secondary | ICD-10-CM | POA: Diagnosis not present

## 2018-06-05 DIAGNOSIS — Z85828 Personal history of other malignant neoplasm of skin: Secondary | ICD-10-CM | POA: Diagnosis not present

## 2018-06-05 DIAGNOSIS — D1801 Hemangioma of skin and subcutaneous tissue: Secondary | ICD-10-CM | POA: Diagnosis not present

## 2018-07-07 DIAGNOSIS — E781 Pure hyperglyceridemia: Secondary | ICD-10-CM | POA: Diagnosis not present

## 2018-07-07 DIAGNOSIS — Z955 Presence of coronary angioplasty implant and graft: Secondary | ICD-10-CM | POA: Diagnosis not present

## 2018-07-07 DIAGNOSIS — E1149 Type 2 diabetes mellitus with other diabetic neurological complication: Secondary | ICD-10-CM | POA: Diagnosis not present

## 2018-07-07 DIAGNOSIS — E7849 Other hyperlipidemia: Secondary | ICD-10-CM | POA: Diagnosis not present

## 2018-07-07 DIAGNOSIS — N183 Chronic kidney disease, stage 3 (moderate): Secondary | ICD-10-CM | POA: Diagnosis not present

## 2018-07-07 DIAGNOSIS — I10 Essential (primary) hypertension: Secondary | ICD-10-CM | POA: Diagnosis not present

## 2018-07-07 DIAGNOSIS — Z9861 Coronary angioplasty status: Secondary | ICD-10-CM | POA: Diagnosis not present

## 2018-07-07 DIAGNOSIS — Z23 Encounter for immunization: Secondary | ICD-10-CM | POA: Diagnosis not present

## 2018-07-07 DIAGNOSIS — E538 Deficiency of other specified B group vitamins: Secondary | ICD-10-CM | POA: Diagnosis not present

## 2018-07-07 DIAGNOSIS — M109 Gout, unspecified: Secondary | ICD-10-CM | POA: Diagnosis not present

## 2018-07-07 DIAGNOSIS — Z0000 Encounter for general adult medical examination without abnormal findings: Secondary | ICD-10-CM | POA: Diagnosis not present

## 2018-10-21 DIAGNOSIS — E119 Type 2 diabetes mellitus without complications: Secondary | ICD-10-CM | POA: Diagnosis not present

## 2018-10-21 DIAGNOSIS — H26493 Other secondary cataract, bilateral: Secondary | ICD-10-CM | POA: Diagnosis not present

## 2018-10-21 DIAGNOSIS — I7090 Unspecified atherosclerosis: Secondary | ICD-10-CM | POA: Diagnosis not present

## 2018-10-21 DIAGNOSIS — H35033 Hypertensive retinopathy, bilateral: Secondary | ICD-10-CM | POA: Diagnosis not present

## 2018-10-25 ENCOUNTER — Encounter: Payer: Self-pay | Admitting: Internal Medicine

## 2018-12-13 DIAGNOSIS — L03211 Cellulitis of face: Secondary | ICD-10-CM | POA: Diagnosis not present

## 2018-12-30 DIAGNOSIS — G629 Polyneuropathy, unspecified: Secondary | ICD-10-CM | POA: Diagnosis not present

## 2018-12-30 DIAGNOSIS — E1129 Type 2 diabetes mellitus with other diabetic kidney complication: Secondary | ICD-10-CM | POA: Diagnosis not present

## 2018-12-30 DIAGNOSIS — N183 Chronic kidney disease, stage 3 (moderate): Secondary | ICD-10-CM | POA: Diagnosis not present

## 2018-12-30 DIAGNOSIS — I6523 Occlusion and stenosis of bilateral carotid arteries: Secondary | ICD-10-CM | POA: Diagnosis not present

## 2018-12-30 DIAGNOSIS — I10 Essential (primary) hypertension: Secondary | ICD-10-CM | POA: Diagnosis not present

## 2018-12-30 DIAGNOSIS — E1149 Type 2 diabetes mellitus with other diabetic neurological complication: Secondary | ICD-10-CM | POA: Diagnosis not present

## 2018-12-30 DIAGNOSIS — E7849 Other hyperlipidemia: Secondary | ICD-10-CM | POA: Diagnosis not present

## 2018-12-30 DIAGNOSIS — Z955 Presence of coronary angioplasty implant and graft: Secondary | ICD-10-CM | POA: Diagnosis not present

## 2019-02-22 DIAGNOSIS — K047 Periapical abscess without sinus: Secondary | ICD-10-CM | POA: Diagnosis not present

## 2019-05-13 DIAGNOSIS — Z85828 Personal history of other malignant neoplasm of skin: Secondary | ICD-10-CM | POA: Diagnosis not present

## 2019-05-13 DIAGNOSIS — L570 Actinic keratosis: Secondary | ICD-10-CM | POA: Diagnosis not present

## 2019-05-13 DIAGNOSIS — D2371 Other benign neoplasm of skin of right lower limb, including hip: Secondary | ICD-10-CM | POA: Diagnosis not present

## 2019-05-13 DIAGNOSIS — D1722 Benign lipomatous neoplasm of skin and subcutaneous tissue of left arm: Secondary | ICD-10-CM | POA: Diagnosis not present

## 2019-05-13 DIAGNOSIS — D1801 Hemangioma of skin and subcutaneous tissue: Secondary | ICD-10-CM | POA: Diagnosis not present

## 2019-05-13 DIAGNOSIS — C44629 Squamous cell carcinoma of skin of left upper limb, including shoulder: Secondary | ICD-10-CM | POA: Diagnosis not present

## 2019-05-13 DIAGNOSIS — L821 Other seborrheic keratosis: Secondary | ICD-10-CM | POA: Diagnosis not present

## 2019-07-13 DIAGNOSIS — Z125 Encounter for screening for malignant neoplasm of prostate: Secondary | ICD-10-CM | POA: Diagnosis not present

## 2019-07-13 DIAGNOSIS — E7849 Other hyperlipidemia: Secondary | ICD-10-CM | POA: Diagnosis not present

## 2019-07-13 DIAGNOSIS — E1129 Type 2 diabetes mellitus with other diabetic kidney complication: Secondary | ICD-10-CM | POA: Diagnosis not present

## 2019-07-13 DIAGNOSIS — E538 Deficiency of other specified B group vitamins: Secondary | ICD-10-CM | POA: Diagnosis not present

## 2019-07-13 DIAGNOSIS — M109 Gout, unspecified: Secondary | ICD-10-CM | POA: Diagnosis not present

## 2019-07-15 ENCOUNTER — Telehealth (HOSPITAL_COMMUNITY): Payer: Self-pay | Admitting: *Deleted

## 2019-07-15 DIAGNOSIS — I10 Essential (primary) hypertension: Secondary | ICD-10-CM | POA: Diagnosis not present

## 2019-07-15 DIAGNOSIS — E1149 Type 2 diabetes mellitus with other diabetic neurological complication: Secondary | ICD-10-CM | POA: Diagnosis not present

## 2019-07-15 DIAGNOSIS — Z1331 Encounter for screening for depression: Secondary | ICD-10-CM | POA: Diagnosis not present

## 2019-07-15 DIAGNOSIS — N183 Chronic kidney disease, stage 3 (moderate): Secondary | ICD-10-CM | POA: Diagnosis not present

## 2019-07-15 DIAGNOSIS — Z1339 Encounter for screening examination for other mental health and behavioral disorders: Secondary | ICD-10-CM | POA: Diagnosis not present

## 2019-07-15 DIAGNOSIS — Z0000 Encounter for general adult medical examination without abnormal findings: Secondary | ICD-10-CM | POA: Diagnosis not present

## 2019-07-15 DIAGNOSIS — D649 Anemia, unspecified: Secondary | ICD-10-CM | POA: Diagnosis not present

## 2019-07-15 DIAGNOSIS — M109 Gout, unspecified: Secondary | ICD-10-CM | POA: Diagnosis not present

## 2019-07-15 DIAGNOSIS — G629 Polyneuropathy, unspecified: Secondary | ICD-10-CM | POA: Diagnosis not present

## 2019-07-15 DIAGNOSIS — E781 Pure hyperglyceridemia: Secondary | ICD-10-CM | POA: Diagnosis not present

## 2019-07-15 DIAGNOSIS — R7989 Other specified abnormal findings of blood chemistry: Secondary | ICD-10-CM | POA: Diagnosis not present

## 2019-07-15 DIAGNOSIS — E785 Hyperlipidemia, unspecified: Secondary | ICD-10-CM | POA: Diagnosis not present

## 2019-07-15 NOTE — Telephone Encounter (Signed)
I spoke with Matthew Bates with Dr. Raul Bates office to discuss an order received from Dr. Brigitte Bates for a carotid duplex on Matthew Bates.  Patient is followed by [facility] provider for this issue and will be rescheduled by [facility] staff for the carotid and provider yearly follow up appointment that was  scheduled for 04/30/2018.  I will also fax his last carotid exam results to PCP.

## 2019-08-04 DIAGNOSIS — I10 Essential (primary) hypertension: Secondary | ICD-10-CM | POA: Diagnosis not present

## 2019-08-04 DIAGNOSIS — R82998 Other abnormal findings in urine: Secondary | ICD-10-CM | POA: Diagnosis not present

## 2019-08-06 LAB — HEMOCCULT SLIDES (X 3 CARDS)

## 2019-08-13 DIAGNOSIS — Z85828 Personal history of other malignant neoplasm of skin: Secondary | ICD-10-CM | POA: Diagnosis not present

## 2019-08-13 DIAGNOSIS — C44311 Basal cell carcinoma of skin of nose: Secondary | ICD-10-CM | POA: Diagnosis not present

## 2019-08-13 DIAGNOSIS — L821 Other seborrheic keratosis: Secondary | ICD-10-CM | POA: Diagnosis not present

## 2019-08-17 ENCOUNTER — Other Ambulatory Visit: Payer: Self-pay

## 2019-08-17 ENCOUNTER — Encounter: Payer: Self-pay | Admitting: Physician Assistant

## 2019-08-17 ENCOUNTER — Ambulatory Visit (INDEPENDENT_AMBULATORY_CARE_PROVIDER_SITE_OTHER): Payer: Medicare HMO | Admitting: Physician Assistant

## 2019-08-17 VITALS — BP 118/52 | HR 64 | Temp 97.4°F | Ht 69.0 in | Wt 189.8 lb

## 2019-08-17 DIAGNOSIS — R1031 Right lower quadrant pain: Secondary | ICD-10-CM | POA: Diagnosis not present

## 2019-08-17 DIAGNOSIS — K219 Gastro-esophageal reflux disease without esophagitis: Secondary | ICD-10-CM | POA: Diagnosis not present

## 2019-08-17 DIAGNOSIS — D509 Iron deficiency anemia, unspecified: Secondary | ICD-10-CM

## 2019-08-17 MED ORDER — FAMOTIDINE 20 MG PO TABS: 20.0000 mg | ORAL_TABLET | Freq: Every day | ORAL | 3 refills | Status: DC

## 2019-08-17 MED ORDER — SUPREP BOWEL PREP KIT 17.5-3.13-1.6 GM/177ML PO SOLN: 1.0000 | ORAL | 0 refills | Status: DC

## 2019-08-17 NOTE — Progress Notes (Signed)
Chief Complaint: Anemia, abdominal pain, GERD  HPI:    Mr. Matthew Bates is a 73 year old Caucasian male with a past medical history as listed below including CAD, known to Dr. Henrene Pastor, who was referred to me by Marton Redwood, MD for a complaint of anemia, abdominal pain and GERD.      09/28/2013 colonoscopy Dr. Henrene Pastor with 3 diminutive polyps and left-sided diverticulosis, repeat recommended in 5 years.    06/03/2018 CBC with a hemoglobin of 11.7, MCV normal.  CMP normal.    07/13/2019 CBC with hemoglobin low 10.7.    07/15/19 iron studies with a total iron binding capacity high at 535, iron saturation low at 9%, ferritin low at 21.1.    08/09/2019 Hemoccult positive x3.    Today, the patient presents clinic and explains that he has a occasional right lower quadrant pain which does not really concern him as it only comes on maybe once or twice a month and only lasts for 10 minutes at a time.  Tells me it is reminiscent of diverticulitis pain but it does not last that long and just goes away.    Also complains today of reflux symptoms which have increased since he was taken off of his Nexium recently due to kidney issues.  Tells me he was put on Pepcid, taking 20 mg just as needed but tells me he would like to take it every day if he could because he has symptoms every day.    Patient is most concerned about his anemia.  Tells me he has been on iron over the past week and half.  Denies any palpitations, shortness of breath, dyspnea on exertion or fatigue.    Denies fever, chills, weight loss, anorexia, nausea, vomiting or symptoms that awaken him from sleep.  Past Medical History:  Diagnosis Date  . Anemia   . CAD (coronary artery disease)   . Carotid artery bruit   . Carotid artery occlusion   . Chronic kidney disease   . DM (diabetes mellitus) ([place])   . GERD (gastroesophageal reflux disease)   . Gout   . High triglycerides   . HTN (hypertension)   . Hyperlipemia   . Obesity     Past Surgical  History:  Procedure Laterality Date  . ANGIOPLASTY    . APPENDECTOMY    . CAROTID ENDARTERECTOMY  05/22/11   Left CEA  . CATARACT EXTRACTION    . COLONOSCOPY  10-09-13  . EYE SURGERY    . KNEE SURGERY     right  . PR VEIN BYPASS GRAFT,AORTO-FEM-POP  2010  . spleen surgery      Current Outpatient Medications  Medication Sig Dispense Refill  . amLODipine (NORVASC) 5 MG tablet TAKE 1 TABLET BY MOUTH TWICE A DAY 60 tablet 0  . aspirin EC 81 MG tablet Take 81 mg by mouth daily.    . Cyanocobalamin (B-12) 2500 MCG TABS Take 1 tablet by mouth daily.    . Famotidine-Ca Carb-Mag Hydrox (PEPCID COMPLETE PO) Take 1 tablet by mouth daily as needed.    . fenofibrate 160 MG tablet Take 1 tablet (160 mg total) by mouth daily. 30 tablet 5  . FERREX 150 150 MG capsule Take 1 tablet by mouth daily.    . folic acid (FOLVITE) 1 MG tablet Take 1 mg by mouth daily.      . furosemide (LASIX) 40 MG tablet Take 20 mg by mouth daily.     Marland Kitchen labetalol (NORMODYNE) 200 MG tablet TAKE  1 TABLET BY MOUTH TWICE A DAY 60 tablet 0  . losartan (COZAAR) 100 MG tablet Take 100 mg by mouth daily.     . MECLIZINE HCL PO Take by mouth as needed.      . metFORMIN (GLUCOPHAGE) 1000 MG tablet Take 1,000 mg by mouth 2 (two) times daily with a meal.     . metoprolol succinate (TOPROL-XL) 25 MG 24 hr tablet Take 50 mg by mouth daily.     . Multiple Vitamin (MULTIVITAMIN) capsule Take 1 capsule by mouth daily.      . rosuvastatin (CRESTOR) 20 MG tablet Take 20 mg by mouth daily.  1   No current facility-administered medications for this visit.     Allergies as of 08/17/2019 - Review Complete 08/17/2019  Allergen Reaction Noted  . Metoclopramide hcl Hypertension 04/26/2011    Family History  Problem Relation Age of Onset  . Coronary artery disease Father   . Heart attack Father   . Hyperlipidemia Father   . Hypertension Father   . Heart disease Father        Heart Disease before age 47- PVD  . Diabetes Brother   .  Heart disease Brother        Heart Disese before age 39  . Hyperlipidemia Brother   . Hypertension Brother   . Cancer Brother        Lukemia  . Heart disease Mother        Heart Disease before age 56  . Hyperlipidemia Mother   . Hypertension Mother   . Heart attack Mother   . Coronary artery disease Other   . Colon cancer Neg Hx     Social History   Socioeconomic History  . Marital status: Married    Spouse name: Not on file  . Number of children: Not on file  . Years of education: Not on file  . Highest education level: Not on file  Occupational History  . Not on file  Social Needs  . Financial resource strain: Not on file  . Food insecurity    Worry: Not on file    Inability: Not on file  . Transportation needs    Medical: Not on file    Non-medical: Not on file  Tobacco Use  . Smoking status: Former Smoker    Types: Cigarettes    Quit date: 11/04/1970    Years since quitting: 48.8  . Smokeless tobacco: Never Used  Substance and Sexual Activity  . Alcohol use: Yes    Comment: rare  . Drug use: No  . Sexual activity: Not on file  Lifestyle  . Physical activity    Days per week: Not on file    Minutes per session: Not on file  . Stress: Not on file  Relationships  . Social Herbalist on phone: Not on file    Gets together: Not on file    Attends religious service: Not on file    Active member of club or organization: Not on file    Attends meetings of clubs or organizations: Not on file    Relationship status: Not on file  . Intimate partner violence    Fear of current or ex partner: Not on file    Emotionally abused: Not on file    Physically abused: Not on file    Forced sexual activity: Not on file  Other Topics Concern  . Not on file  Social History Narrative  . Not  on file    Review of Systems:    Constitutional: No weight loss, fever or chills Skin: No rash  Cardiovascular: No chest pain Respiratory: No SOB  Gastrointestinal: See  HPI and otherwise negative Genitourinary: No dysuria Neurological: No headache, dizziness or syncope Musculoskeletal: No new muscle or joint pain Hematologic: No bleeding  Psychiatric: No history of depression or anxiety   Physical Exam:  Vital signs: BP (!) 118/52   Pulse 64 Comment: slightly irregular  Temp (!) 97.4 F (36.3 C)   Ht 5\' 9"  (1.753 m)   Wt 189 lb 12.8 oz (86.1 kg)   BMI 28.03 kg/m   Constitutional:   Pleasant Caucasian male appears to be in NAD, Well developed, Well nourished, alert and cooperative Head:  Normocephalic and atraumatic. Eyes:   PEERL, EOMI. No icterus. Conjunctiva pink. Ears:  Normal auditory acuity. Neck:  Supple Throat: Oral cavity and pharynx without inflammation, swelling or lesion.  Respiratory: Respirations even and unlabored. Lungs clear to auscultation bilaterally.   No wheezes, crackles, or rhonchi.  Cardiovascular: Normal S1, S2. No MRG. Regular rate and rhythm. No peripheral edema, cyanosis or pallor.  Gastrointestinal:  Soft, nondistended, nontender. No rebound or guarding. Normal bowel sounds. No appreciable masses or hepatomegaly. Rectal:  Not performed.  Msk:  Symmetrical without gross deformities. Without edema, no deformity or joint abnormality.  Neurologic:  Alert and  oriented x4;  grossly normal neurologically.  Skin:   Dry and intact without significant lesions or rashes. Psychiatric:Demonstrates good judgement and reason without abnormal affect or behaviors.  See HPI for recent labs.  Assessment: 1.  Iron deficiency anemia: Decreasing hemoglobin over the past 2 months with iron deficiency; consider GI source of blood loss versus decreased iron absorption 2.  Right lower quadrant pain: Very occasional, consider gas pain versus other 3.  GERD: Uncontrolled since he has been taken off of his Nexium due to concerns for kidney issues, currently only using Pepcid as needed  Plan: 1.  Scheduled patient for an EGD and colonoscopy in  the [place] with Dr. Henrene Pastor due to [place].  Discussed risks, benefits, limitations and alternatives and patient agrees to proceed. 2.  Continue iron as prescribed by PCP 3.  Prescribed Famotidine 20 mg daily, #90 with 3 refills.  Did discuss with patient that he can increase to 40 mg twice daily if it is necessary. 4.  Reviewed antireflux diet and lifestyle modifications. 5.  Patient to follow in clinic per recommendations from Dr. Henrene Pastor after time of procedure.  Ellouise Newer, PA-C [facility] 08/17/2019, 8:36 AM  Cc: Marton Redwood, MD

## 2019-08-17 NOTE — Progress Notes (Signed)
Assessment and plan reviewed 

## 2019-08-17 NOTE — Patient Instructions (Signed)
If you are age 74 or older, your body mass index should be between 23-30. Your Body mass index is 28.03 kg/m. If this is out of the aforementioned range listed, please consider follow up with your Primary Care Provider.  If you are age 62 or younger, your body mass index should be between 19-25. Your Body mass index is 28.03 kg/m. If this is out of the aformentioned range listed, please consider follow up with your Primary Care Provider.    You have been scheduled for an endoscopy and colonoscopy. Please follow the written instructions given to you at your visit today. Please pick up your prep supplies at the pharmacy within the next 1-3 days. If you use inhalers (even only as needed), please bring them with you on the day of your procedure.  We have sent the following medications to your pharmacy for you to pick up at your convenience: Famotidine    Thank you for choosing me and [facility].  [name]

## 2019-08-18 ENCOUNTER — Other Ambulatory Visit: Payer: Self-pay

## 2019-08-18 DIAGNOSIS — Z1159 Encounter for screening for other viral diseases: Secondary | ICD-10-CM

## 2019-08-18 NOTE — Progress Notes (Signed)
37

## 2019-09-17 ENCOUNTER — Ambulatory Visit (INDEPENDENT_AMBULATORY_CARE_PROVIDER_SITE_OTHER): Payer: Medicare HMO

## 2019-09-17 ENCOUNTER — Other Ambulatory Visit: Payer: Self-pay | Admitting: Internal Medicine

## 2019-09-17 DIAGNOSIS — Z1159 Encounter for screening for other viral diseases: Secondary | ICD-10-CM

## 2019-09-20 ENCOUNTER — Encounter: Payer: Self-pay | Admitting: Internal Medicine

## 2019-09-20 LAB — SARS CORONAVIRUS 2 (TAT 6-24 HRS): SARS Coronavirus 2: NEGATIVE

## 2019-09-21 ENCOUNTER — Encounter: Payer: Self-pay | Admitting: Internal Medicine

## 2019-09-21 ENCOUNTER — Other Ambulatory Visit: Payer: Self-pay

## 2019-09-21 ENCOUNTER — Ambulatory Visit (AMBULATORY_SURGERY_CENTER): Payer: Medicare HMO | Admitting: Internal Medicine

## 2019-09-21 VITALS — BP 157/70 | HR 61 | Temp 97.7°F | Resp 15 | Ht 69.0 in | Wt 189.0 lb

## 2019-09-21 DIAGNOSIS — K219 Gastro-esophageal reflux disease without esophagitis: Secondary | ICD-10-CM | POA: Diagnosis not present

## 2019-09-21 DIAGNOSIS — R109 Unspecified abdominal pain: Secondary | ICD-10-CM | POA: Diagnosis not present

## 2019-09-21 DIAGNOSIS — R1031 Right lower quadrant pain: Secondary | ICD-10-CM | POA: Diagnosis not present

## 2019-09-21 DIAGNOSIS — K2100 Gastro-esophageal reflux disease with esophagitis, without bleeding: Secondary | ICD-10-CM

## 2019-09-21 DIAGNOSIS — R195 Other fecal abnormalities: Secondary | ICD-10-CM | POA: Diagnosis not present

## 2019-09-21 DIAGNOSIS — D649 Anemia, unspecified: Secondary | ICD-10-CM | POA: Diagnosis not present

## 2019-09-21 DIAGNOSIS — D124 Benign neoplasm of descending colon: Secondary | ICD-10-CM | POA: Diagnosis not present

## 2019-09-21 DIAGNOSIS — D509 Iron deficiency anemia, unspecified: Secondary | ICD-10-CM | POA: Diagnosis not present

## 2019-09-21 MED ORDER — SODIUM CHLORIDE 0.9 % IV SOLN: 500.0000 mL | Freq: Once | INTRAVENOUS | Status: DC

## 2019-09-21 NOTE — Patient Instructions (Signed)
Discharge instructions given. Handouts on polyps,diverticulosis and hemorrhoids. Resume previous medications. Increase famotidine to 20mg  twice daily. Resume iron therapy. YOU HAD AN ENDOSCOPIC PROCEDURE TODAY AT [facility]:   Refer to the procedure report that was given to you for any specific questions about what was found during the examination.  If the procedure report does not answer your questions, please call your gastroenterologist to clarify.  If you requested that your care partner not be given the details of your procedure findings, then the procedure report has been included in a sealed envelope for you to review at your convenience later.  YOU SHOULD EXPECT: Some feelings of bloating in the abdomen. Passage of more gas than usual.  Walking can help get rid of the air that was put into your GI tract during the procedure and reduce the bloating. If you had a lower endoscopy (such as a colonoscopy or flexible sigmoidoscopy) you may notice spotting of blood in your stool or on the toilet paper. If you underwent a bowel prep for your procedure, you may not have a normal bowel movement for a few days.  Please Note:  You might notice some irritation and congestion in your nose or some drainage.  This is from the oxygen used during your procedure.  There is no need for concern and it should clear up in a day or so.  SYMPTOMS TO REPORT IMMEDIATELY:   Following lower endoscopy (colonoscopy or flexible sigmoidoscopy):  Excessive amounts of blood in the stool  Significant tenderness or worsening of abdominal pains  Swelling of the abdomen that is new, acute  Fever of 100F or higher   Following upper endoscopy (EGD)  Vomiting of blood or coffee ground material  New chest pain or pain under the shoulder blades  Painful or persistently difficult swallowing  New shortness of breath  Fever of 100F or higher  Black, tarry-looking stools  For urgent or emergent issues, a  gastroenterologist can be reached at any hour by calling [phone].   DIET:  We do recommend a small meal at first, but then you may proceed to your regular diet.  Drink plenty of fluids but you should avoid alcoholic beverages for 24 hours.  ACTIVITY:  You should plan to take it easy for the rest of today and you should NOT DRIVE or use heavy machinery until tomorrow (because of the sedation medicines used during the test).    FOLLOW UP: Our staff will call the number listed on your records 48-72 hours following your procedure to check on you and address any questions or concerns that you may have regarding the information given to you following your procedure. If we do not reach you, we will leave a message.  We will attempt to reach you two times.  During this call, we will ask if you have developed any symptoms of COVID 19. If you develop any symptoms (ie: fever, flu-like symptoms, shortness of breath, cough etc.) before then, please call [phone].  If you test positive for Covid 19 in the 2 weeks post procedure, please call and report this information to [place].    If any biopsies were taken you will be contacted by phone or by letter within the next 1-3 weeks.  Please call us at [phone] if you have not heard about the biopsies in 3 weeks.    SIGNATURES/CONFIDENTIALITY: You and/or your care partner have signed paperwork which will be entered into your electronic medical record.  These  signatures attest to the fact that that the information above on your After Visit Summary has been reviewed and is understood.  Full responsibility of the confidentiality of this discharge information lies with you and/or your care-partner. 

## 2019-09-21 NOTE — Op Note (Signed)
[place] Patient Name: Matthew Bates Procedure Date: 09/21/2019 11:24 AM MRN: [id number] Endoscopist: Docia Chuck. Henrene Pastor , MD Age: 73 Referring MD:  Date of Birth: 1946-09-12 Gender: Male Account #: [id number] Procedure:                Upper GI endoscopy with biopsies Indications:              Iron deficiency anemia, Esophageal reflux, Heme                            positive stool Medicines:                Monitored Anesthesia Care Procedure:                Pre-Anesthesia Assessment:                           - Prior to the procedure, a History and Physical                            was performed, and patient medications and                            allergies were reviewed. The patient's tolerance of                            previous anesthesia was also reviewed. The risks                            and benefits of the procedure and the sedation                            options and risks were discussed with the patient.                            All questions were answered, and informed consent                            was obtained. Prior Anticoagulants: The patient has                            taken no previous anticoagulant or antiplatelet                            agents. ASA Grade Assessment: III - A patient with                            severe systemic disease. After reviewing the risks                            and benefits, the patient was deemed in                            satisfactory condition to undergo the procedure.  After obtaining informed consent, the endoscope was                            passed under direct vision. Throughout the                            procedure, the patient's blood pressure, pulse, and                            oxygen saturations were monitored continuously. The                            Endoscope was introduced through the mouth, and                            advanced to the third part of  duodenum. The upper                            GI endoscopy was accomplished without difficulty.                            The patient tolerated the procedure well. Scope In: Scope Out: Findings:                 The esophagus revealed several erosions in the                            distal portion secondary to reflux.                           The stomach was normal save a small lateral hernia                            without erosions.                           The examined duodenum was normal. Biopsies for                            histology were taken with a cold forceps for                            evaluation of celiac disease.                           The cardia and gastric fundus were normal on                            retroflexion. Complications:            No immediate complications. Estimated Blood Loss:     Estimated blood loss: none. Impression:               1. GERD with esophagitis  2. Otherwise unremarkable EGD to third duodenum                           3. Status post duodenal biopsies to rule out sprue. Recommendation:           1. Increase famotidine to 20 mg twice daily                           2. Resume iron therapy                           3. Resume previous medications and diet                           4. Have your blood counts and iron levels followed                            up with Dr. Brigitte Pulse. Docia Chuck. Henrene Pastor, MD 09/21/2019 12:05:44 PM This report has been signed electronically.

## 2019-09-21 NOTE — Progress Notes (Signed)
A/ox3, pleased with MAC, report to RN 

## 2019-09-21 NOTE — Progress Notes (Signed)
Called to room to assist during endoscopic procedure.  Patient ID and intended procedure confirmed with present staff. Received instructions for my participation in the procedure from the performing physician.  

## 2019-09-21 NOTE — Op Note (Signed)
[place] Patient Name: Matthew Bates Procedure Date: 09/21/2019 11:25 AM MRN: [id number] Endoscopist: Docia Chuck. Henrene Pastor , MD Age: 73 Referring MD:  Date of Birth: 1946-02-02 Gender: Male Account #: [id number] Procedure:                Colonoscopy with cold snare polypectomy x 1 Indications:              High risk colon cancer surveillance: Personal                            history of adenoma (10 mm or greater in size), High                            risk colon cancer surveillance: Personal history of                            multiple (3 or more) adenomas. Previous                            examinations 2008, November 2014. Also recently                            referred over for iron deficiency anemia and                            Hemoccult-positive stool. Some vague mild stable                            intermittent right lower quadrant pain as well. Medicines:                Monitored Anesthesia Care Procedure:                Pre-Anesthesia Assessment:                           - Prior to the procedure, a History and Physical                            was performed, and patient medications and                            allergies were reviewed. The patient's tolerance of                            previous anesthesia was also reviewed. The risks                            and benefits of the procedure and the sedation                            options and risks were discussed with the patient.                            All questions were answered, and informed consent  was obtained. Prior Anticoagulants: The patient has                            taken no previous anticoagulant or antiplatelet                            agents. ASA Grade Assessment: III - A patient with                            severe systemic disease. After reviewing the risks                            and benefits, the patient was deemed in    satisfactory condition to undergo the procedure.                           After obtaining informed consent, the colonoscope                            was passed under direct vision. Throughout the                            procedure, the patient's blood pressure, pulse, and                            oxygen saturations were monitored continuously. The                            Colonoscope was introduced through the anus and                            advanced to the the cecum, identified by                            appendiceal orifice and ileocecal valve. The                            terminal ileum, ileocecal valve, appendiceal                            orifice, and rectum were photographed. The quality                            of the bowel preparation was excellent. The                            colonoscopy was performed without difficulty. The                            patient tolerated the procedure well. The bowel                            preparation used was SUPREP via split dose  instruction. Scope In: 11:36:26 AM Scope Out: 11:50:46 AM Scope Withdrawal Time: 0 hours 12 minutes 26 seconds  Total Procedure Duration: 0 hours 14 minutes 20 seconds  Findings:                 The terminal ileum appeared normal.                           A 2 mm polyp was found in the descending colon. The                            polyp was removed with a cold snare. Resection and                            retrieval were complete.                           Multiple small and large-mouthed diverticula were                            found in the left colon.                           Internal hemorrhoids were found during retroflexion.                           The exam was otherwise without abnormality on                            direct and retroflexion views. Complications:            No immediate complications. Estimated blood loss:                             None. Estimated Blood Loss:     Estimated blood loss: none. Impression:               - The examined portion of the ileum was normal.                           - One 2 mm polyp in the descending colon, removed                            with a cold snare. Resected and retrieved.                           - Diverticulosis in the left colon.                           - Internal hemorrhoids.                           - The examination was otherwise normal on direct                            and retroflexion views. Recommendation:           -  Repeat colonoscopy in 5 years for surveillance.                           - Patient has a contact number available for                            emergencies. The signs and symptoms of potential                            delayed complications were discussed with the                            patient. Return to normal activities tomorrow.                            Written discharge instructions were provided to the                            patient.                           - Resume previous diet.                           - Continue present medications.                           - Await pathology results. Docia Chuck. Henrene Pastor, MD 09/21/2019 11:55:28 AM This report has been signed electronically.

## 2019-09-23 ENCOUNTER — Telehealth: Payer: Self-pay

## 2019-09-23 ENCOUNTER — Encounter: Payer: Self-pay | Admitting: Internal Medicine

## 2019-09-23 NOTE — Telephone Encounter (Signed)
1st follow up call made.  NA voicemail not set up.

## 2019-09-23 NOTE — Telephone Encounter (Signed)
No voicemail set up 

## 2019-10-04 DIAGNOSIS — Z85828 Personal history of other malignant neoplasm of skin: Secondary | ICD-10-CM | POA: Diagnosis not present

## 2019-10-04 DIAGNOSIS — C44311 Basal cell carcinoma of skin of nose: Secondary | ICD-10-CM | POA: Diagnosis not present

## 2019-10-09 ENCOUNTER — Other Ambulatory Visit: Payer: Self-pay

## 2019-10-09 ENCOUNTER — Emergency Department (HOSPITAL_COMMUNITY): Payer: Medicare HMO

## 2019-10-09 ENCOUNTER — Encounter (HOSPITAL_COMMUNITY): Payer: Self-pay | Admitting: Emergency Medicine

## 2019-10-09 ENCOUNTER — Inpatient Hospital Stay (HOSPITAL_COMMUNITY)
Admission: EM | Admit: 2019-10-09 | Discharge: 2019-10-23 | DRG: 177 | Disposition: A | Discharge status: Home Health | Payer: Medicare HMO | Source: Ambulatory Visit | Attending: Internal Medicine | Admitting: Internal Medicine

## 2019-10-09 DIAGNOSIS — E872 Acidosis: Secondary | ICD-10-CM | POA: Diagnosis present

## 2019-10-09 DIAGNOSIS — J189 Pneumonia, unspecified organism: Secondary | ICD-10-CM | POA: Diagnosis not present

## 2019-10-09 DIAGNOSIS — I25810 Atherosclerosis of coronary artery bypass graft(s) without angina pectoris: Secondary | ICD-10-CM | POA: Diagnosis present

## 2019-10-09 DIAGNOSIS — N179 Acute kidney failure, unspecified: Secondary | ICD-10-CM | POA: Diagnosis present

## 2019-10-09 DIAGNOSIS — U071 COVID-19: Principal | ICD-10-CM | POA: Diagnosis present

## 2019-10-09 DIAGNOSIS — E1122 Type 2 diabetes mellitus with diabetic chronic kidney disease: Secondary | ICD-10-CM | POA: Diagnosis present

## 2019-10-09 DIAGNOSIS — E1169 Type 2 diabetes mellitus with other specified complication: Secondary | ICD-10-CM | POA: Diagnosis not present

## 2019-10-09 DIAGNOSIS — R05 Cough: Secondary | ICD-10-CM | POA: Diagnosis not present

## 2019-10-09 DIAGNOSIS — J1282 Pneumonia due to coronavirus disease 2019: Secondary | ICD-10-CM

## 2019-10-09 DIAGNOSIS — E1165 Type 2 diabetes mellitus with hyperglycemia: Secondary | ICD-10-CM | POA: Diagnosis present

## 2019-10-09 DIAGNOSIS — I493 Ventricular premature depolarization: Secondary | ICD-10-CM | POA: Diagnosis present

## 2019-10-09 DIAGNOSIS — J1289 Other viral pneumonia: Secondary | ICD-10-CM | POA: Diagnosis present

## 2019-10-09 DIAGNOSIS — Z6827 Body mass index (BMI) 27.0-27.9, adult: Secondary | ICD-10-CM

## 2019-10-09 DIAGNOSIS — I10 Essential (primary) hypertension: Secondary | ICD-10-CM | POA: Diagnosis not present

## 2019-10-09 DIAGNOSIS — E785 Hyperlipidemia, unspecified: Secondary | ICD-10-CM | POA: Diagnosis present

## 2019-10-09 DIAGNOSIS — J9600 Acute respiratory failure, unspecified whether with hypoxia or hypercapnia: Secondary | ICD-10-CM | POA: Diagnosis present

## 2019-10-09 DIAGNOSIS — Z833 Family history of diabetes mellitus: Secondary | ICD-10-CM

## 2019-10-09 DIAGNOSIS — I6523 Occlusion and stenosis of bilateral carotid arteries: Secondary | ICD-10-CM | POA: Diagnosis present

## 2019-10-09 DIAGNOSIS — M7989 Other specified soft tissue disorders: Secondary | ICD-10-CM | POA: Diagnosis not present

## 2019-10-09 DIAGNOSIS — I2699 Other pulmonary embolism without acute cor pulmonale: Secondary | ICD-10-CM | POA: Diagnosis not present

## 2019-10-09 DIAGNOSIS — I82492 Acute embolism and thrombosis of other specified deep vein of left lower extremity: Secondary | ICD-10-CM | POA: Diagnosis not present

## 2019-10-09 DIAGNOSIS — R0689 Other abnormalities of breathing: Secondary | ICD-10-CM | POA: Diagnosis not present

## 2019-10-09 DIAGNOSIS — E669 Obesity, unspecified: Secondary | ICD-10-CM | POA: Diagnosis not present

## 2019-10-09 DIAGNOSIS — I130 Hypertensive heart and chronic kidney disease with heart failure and stage 1 through stage 4 chronic kidney disease, or unspecified chronic kidney disease: Secondary | ICD-10-CM | POA: Diagnosis present

## 2019-10-09 DIAGNOSIS — R0902 Hypoxemia: Secondary | ICD-10-CM

## 2019-10-09 DIAGNOSIS — I82462 Acute embolism and thrombosis of left calf muscular vein: Secondary | ICD-10-CM | POA: Diagnosis not present

## 2019-10-09 DIAGNOSIS — J069 Acute upper respiratory infection, unspecified: Secondary | ICD-10-CM | POA: Diagnosis present

## 2019-10-09 DIAGNOSIS — I214 Non-ST elevation (NSTEMI) myocardial infarction: Secondary | ICD-10-CM | POA: Diagnosis not present

## 2019-10-09 DIAGNOSIS — I82441 Acute embolism and thrombosis of right tibial vein: Secondary | ICD-10-CM | POA: Diagnosis not present

## 2019-10-09 DIAGNOSIS — R7401 Elevation of levels of liver transaminase levels: Secondary | ICD-10-CM | POA: Diagnosis present

## 2019-10-09 DIAGNOSIS — Z79899 Other long term (current) drug therapy: Secondary | ICD-10-CM

## 2019-10-09 DIAGNOSIS — C44301 Unspecified malignant neoplasm of skin of nose: Secondary | ICD-10-CM | POA: Diagnosis present

## 2019-10-09 DIAGNOSIS — Z9861 Coronary angioplasty status: Secondary | ICD-10-CM

## 2019-10-09 DIAGNOSIS — R531 Weakness: Secondary | ICD-10-CM | POA: Diagnosis not present

## 2019-10-09 DIAGNOSIS — I5033 Acute on chronic diastolic (congestive) heart failure: Secondary | ICD-10-CM | POA: Diagnosis not present

## 2019-10-09 DIAGNOSIS — I340 Nonrheumatic mitral (valve) insufficiency: Secondary | ICD-10-CM | POA: Diagnosis not present

## 2019-10-09 DIAGNOSIS — E781 Pure hyperglyceridemia: Secondary | ICD-10-CM | POA: Diagnosis present

## 2019-10-09 DIAGNOSIS — Z8349 Family history of other endocrine, nutritional and metabolic diseases: Secondary | ICD-10-CM

## 2019-10-09 DIAGNOSIS — D631 Anemia in chronic kidney disease: Secondary | ICD-10-CM | POA: Diagnosis present

## 2019-10-09 DIAGNOSIS — Z87891 Personal history of nicotine dependence: Secondary | ICD-10-CM

## 2019-10-09 DIAGNOSIS — Z209 Contact with and (suspected) exposure to unspecified communicable disease: Secondary | ICD-10-CM | POA: Diagnosis not present

## 2019-10-09 DIAGNOSIS — N182 Chronic kidney disease, stage 2 (mild): Secondary | ICD-10-CM | POA: Diagnosis present

## 2019-10-09 DIAGNOSIS — Z7982 Long term (current) use of aspirin: Secondary | ICD-10-CM

## 2019-10-09 DIAGNOSIS — I2510 Atherosclerotic heart disease of native coronary artery without angina pectoris: Secondary | ICD-10-CM | POA: Diagnosis present

## 2019-10-09 DIAGNOSIS — G4733 Obstructive sleep apnea (adult) (pediatric): Secondary | ICD-10-CM | POA: Diagnosis not present

## 2019-10-09 DIAGNOSIS — R0989 Other specified symptoms and signs involving the circulatory and respiratory systems: Secondary | ICD-10-CM | POA: Diagnosis not present

## 2019-10-09 DIAGNOSIS — E871 Hypo-osmolality and hyponatremia: Secondary | ICD-10-CM | POA: Diagnosis present

## 2019-10-09 DIAGNOSIS — R7989 Other specified abnormal findings of blood chemistry: Secondary | ICD-10-CM | POA: Diagnosis not present

## 2019-10-09 DIAGNOSIS — K219 Gastro-esophageal reflux disease without esophagitis: Secondary | ICD-10-CM | POA: Diagnosis present

## 2019-10-09 DIAGNOSIS — R0602 Shortness of breath: Secondary | ICD-10-CM | POA: Diagnosis not present

## 2019-10-09 DIAGNOSIS — I82431 Acute embolism and thrombosis of right popliteal vein: Secondary | ICD-10-CM | POA: Diagnosis not present

## 2019-10-09 DIAGNOSIS — J9601 Acute respiratory failure with hypoxia: Secondary | ICD-10-CM | POA: Diagnosis not present

## 2019-10-09 DIAGNOSIS — E875 Hyperkalemia: Secondary | ICD-10-CM | POA: Diagnosis not present

## 2019-10-09 DIAGNOSIS — T380X5A Adverse effect of glucocorticoids and synthetic analogues, initial encounter: Secondary | ICD-10-CM | POA: Diagnosis not present

## 2019-10-09 DIAGNOSIS — I824Y3 Acute embolism and thrombosis of unspecified deep veins of proximal lower extremity, bilateral: Secondary | ICD-10-CM | POA: Diagnosis not present

## 2019-10-09 DIAGNOSIS — Z8249 Family history of ischemic heart disease and other diseases of the circulatory system: Secondary | ICD-10-CM

## 2019-10-09 DIAGNOSIS — I371 Nonrheumatic pulmonary valve insufficiency: Secondary | ICD-10-CM | POA: Diagnosis not present

## 2019-10-09 DIAGNOSIS — D72829 Elevated white blood cell count, unspecified: Secondary | ICD-10-CM | POA: Diagnosis not present

## 2019-10-09 DIAGNOSIS — Z7984 Long term (current) use of oral hypoglycemic drugs: Secondary | ICD-10-CM

## 2019-10-09 DIAGNOSIS — R778 Other specified abnormalities of plasma proteins: Secondary | ICD-10-CM | POA: Diagnosis present

## 2019-10-09 LAB — FERRITIN: Ferritin: 158 ng/mL (ref 24–336)

## 2019-10-09 LAB — CBC WITH DIFFERENTIAL/PLATELET
Abs Immature Granulocytes: 0.04 10*3/uL (ref 0.00–0.07)
Basophils Absolute: 0 10*3/uL (ref 0.0–0.1)
Basophils Relative: 0 %
Eosinophils Absolute: 0 10*3/uL (ref 0.0–0.5)
Eosinophils Relative: 0 %
HCT: 37.6 % — ABNORMAL LOW (ref 39.0–52.0)
Hemoglobin: 11.7 g/dL — ABNORMAL LOW (ref 13.0–17.0)
Immature Granulocytes: 1 %
Lymphocytes Relative: 13 %
Lymphs Abs: 1.1 10*3/uL (ref 0.7–4.0)
MCH: 25.2 pg — ABNORMAL LOW (ref 26.0–34.0)
MCHC: 31.1 g/dL (ref 30.0–36.0)
MCV: 80.9 fL (ref 80.0–100.0)
Monocytes Absolute: 0.3 10*3/uL (ref 0.1–1.0)
Monocytes Relative: 4 %
Neutro Abs: 7.1 10*3/uL (ref 1.7–7.7)
Neutrophils Relative %: 82 %
Platelets: 243 10*3/uL (ref 150–400)
RBC: 4.65 MIL/uL (ref 4.22–5.81)
RDW: 16.4 % — ABNORMAL HIGH (ref 11.5–15.5)
WBC: 8.7 10*3/uL (ref 4.0–10.5)
nRBC: 0 % (ref 0.0–0.2)

## 2019-10-09 LAB — COMPREHENSIVE METABOLIC PANEL
ALT: 21 U/L (ref 0–44)
AST: 55 U/L — ABNORMAL HIGH (ref 15–41)
Albumin: 3.1 g/dL — ABNORMAL LOW (ref 3.5–5.0)
Alkaline Phosphatase: 49 U/L (ref 38–126)
Anion gap: 12 (ref 5–15)
BUN: 24 mg/dL — ABNORMAL HIGH (ref 8–23)
CO2: 18 mmol/L — ABNORMAL LOW (ref 22–32)
Calcium: 9.3 mg/dL (ref 8.9–10.3)
Chloride: 102 mmol/L (ref 98–111)
Creatinine, Ser: 1.97 mg/dL — ABNORMAL HIGH (ref 0.61–1.24)
GFR calc Af Amer: 38 mL/min — ABNORMAL LOW (ref >60)
GFR calc non Af Amer: 33 mL/min — ABNORMAL LOW (ref >60)
Glucose, Bld: 152 mg/dL — ABNORMAL HIGH (ref 70–99)
Potassium: 3.8 mmol/L (ref 3.5–5.1)
Sodium: 132 mmol/L — ABNORMAL LOW (ref 135–145)
Total Bilirubin: 0.9 mg/dL (ref 0.3–1.2)
Total Protein: 7.2 g/dL (ref 6.5–8.1)

## 2019-10-09 LAB — POC SARS CORONAVIRUS 2 AG -  ED: SARS Coronavirus 2 Ag: POSITIVE — AB

## 2019-10-09 LAB — D-DIMER, QUANTITATIVE: D-Dimer, Quant: 3.94 ug/mL-FEU — ABNORMAL HIGH (ref 0.00–0.50)

## 2019-10-09 LAB — POCT I-STAT 7, (LYTES, BLD GAS, ICA,H+H)
Acid-base deficit: 9 mmol/L — ABNORMAL HIGH (ref 0.0–2.0)
Bicarbonate: 15.9 mmol/L — ABNORMAL LOW (ref 20.0–28.0)
Calcium, Ion: 1.2 mmol/L (ref 1.15–1.40)
HCT: 34 % — ABNORMAL LOW (ref 39.0–52.0)
Hemoglobin: 11.6 g/dL — ABNORMAL LOW (ref 13.0–17.0)
O2 Saturation: 97 %
Patient temperature: 98.7
Potassium: 3.9 mmol/L (ref 3.5–5.1)
Sodium: 132 mmol/L — ABNORMAL LOW (ref 135–145)
TCO2: 17 mmol/L — ABNORMAL LOW (ref 22–32)
pCO2 arterial: 29.3 mmHg — ABNORMAL LOW (ref 32.0–48.0)
pH, Arterial: 7.342 — ABNORMAL LOW (ref 7.350–7.450)
pO2, Arterial: 89 mmHg (ref 83.0–108.0)

## 2019-10-09 LAB — LACTATE DEHYDROGENASE: LDH: 316 U/L — ABNORMAL HIGH (ref 98–192)

## 2019-10-09 LAB — C-REACTIVE PROTEIN: CRP: 19.8 mg/dL — ABNORMAL HIGH (ref <1.0)

## 2019-10-09 LAB — CBG MONITORING, ED: Glucose-Capillary: 175 mg/dL — ABNORMAL HIGH (ref 70–99)

## 2019-10-09 LAB — HEMOGLOBIN A1C
Hgb A1c MFr Bld: 7.2 % — ABNORMAL HIGH (ref 4.8–5.6)
Mean Plasma Glucose: 159.94 mg/dL

## 2019-10-09 LAB — BRAIN NATRIURETIC PEPTIDE: B Natriuretic Peptide: 1496.3 pg/mL — ABNORMAL HIGH (ref 0.0–100.0)

## 2019-10-09 LAB — TROPONIN I (HIGH SENSITIVITY): Troponin I (High Sensitivity): 2549 ng/L (ref <18)

## 2019-10-09 LAB — FIBRINOGEN: Fibrinogen: 665 mg/dL — ABNORMAL HIGH (ref 210–475)

## 2019-10-09 LAB — PROCALCITONIN: Procalcitonin: 0.76 ng/mL

## 2019-10-09 MED ORDER — ROSUVASTATIN CALCIUM 5 MG PO TABS
20.0000 mg | ORAL_TABLET | Freq: Every day | ORAL | Status: DC
  Administered 2019-10-10 – 2019-10-23 (×14): 20 mg via ORAL
  Filled 2019-10-09 (×7): qty 1
  Filled 2019-10-09: qty 4
  Filled 2019-10-09 (×6): qty 1

## 2019-10-09 MED ORDER — DEXAMETHASONE SODIUM PHOSPHATE 10 MG/ML IJ SOLN
6.0000 mg | Freq: Once | INTRAMUSCULAR | Status: AC
  Administered 2019-10-09: 16:00:00 6 mg via INTRAVENOUS
  Filled 2019-10-09: qty 1

## 2019-10-09 MED ORDER — FAMOTIDINE 20 MG PO TABS
40.0000 mg | ORAL_TABLET | Freq: Two times a day (BID) | ORAL | Status: DC
  Administered 2019-10-09 – 2019-10-23 (×28): 40 mg via ORAL
  Filled 2019-10-09 (×28): qty 2

## 2019-10-09 MED ORDER — SODIUM CHLORIDE 0.9 % IV SOLN
2.0000 g | INTRAVENOUS | Status: DC
  Administered 2019-10-09 – 2019-10-10 (×2): 2 g via INTRAVENOUS
  Filled 2019-10-09 (×2): qty 20

## 2019-10-09 MED ORDER — SODIUM CHLORIDE 0.9% FLUSH
3.0000 mL | Freq: Two times a day (BID) | INTRAVENOUS | Status: DC
  Administered 2019-10-12 – 2019-10-18 (×10): 3 mL via INTRAVENOUS
  Administered 2019-10-19: 10 mL via INTRAVENOUS
  Administered 2019-10-19 – 2019-10-20 (×2): 3 mL via INTRAVENOUS

## 2019-10-09 MED ORDER — HEPARIN BOLUS VIA INFUSION
4000.0000 [IU] | Freq: Once | INTRAVENOUS | Status: AC
  Administered 2019-10-09: 20:00:00 4000 [IU] via INTRAVENOUS
  Filled 2019-10-09: qty 4000

## 2019-10-09 MED ORDER — DEXAMETHASONE SODIUM PHOSPHATE 10 MG/ML IJ SOLN: 6.0000 mg | Freq: Once | INTRAMUSCULAR | Status: DC

## 2019-10-09 MED ORDER — SODIUM CHLORIDE 0.9% FLUSH
3.0000 mL | Freq: Two times a day (BID) | INTRAVENOUS | Status: DC
  Administered 2019-10-11 – 2019-10-23 (×20): 3 mL via INTRAVENOUS

## 2019-10-09 MED ORDER — DOCUSATE SODIUM 100 MG PO CAPS
100.0000 mg | ORAL_CAPSULE | Freq: Two times a day (BID) | ORAL | Status: DC
  Administered 2019-10-09 – 2019-10-23 (×27): 100 mg via ORAL
  Filled 2019-10-09 (×27): qty 1

## 2019-10-09 MED ORDER — ALBUTEROL SULFATE HFA 108 (90 BASE) MCG/ACT IN AERS
2.0000 | INHALATION_SPRAY | RESPIRATORY_TRACT | Status: DC | PRN
  Administered 2019-10-11 – 2019-10-13 (×3): 2 via RESPIRATORY_TRACT
  Filled 2019-10-09: qty 6.7

## 2019-10-09 MED ORDER — HEPARIN (PORCINE) 25000 UT/250ML-% IV SOLN
1250.0000 [IU]/h | INTRAVENOUS | Status: DC
  Administered 2019-10-09 – 2019-10-10 (×2): 1000 [IU]/h via INTRAVENOUS
  Filled 2019-10-09 (×2): qty 250

## 2019-10-09 MED ORDER — DEXAMETHASONE SODIUM PHOSPHATE 10 MG/ML IJ SOLN
6.0000 mg | INTRAMUSCULAR | Status: DC
  Administered 2019-10-10 – 2019-10-16 (×7): 6 mg via INTRAVENOUS
  Filled 2019-10-09 (×7): qty 1

## 2019-10-09 MED ORDER — SODIUM CHLORIDE 0.9 % IV SOLN
100.0000 mg | Freq: Every day | INTRAVENOUS | Status: AC
  Administered 2019-10-10 – 2019-10-13 (×4): 100 mg via INTRAVENOUS
  Filled 2019-10-09: qty 20
  Filled 2019-10-09: qty 100
  Filled 2019-10-09 (×2): qty 20

## 2019-10-09 MED ORDER — INSULIN ASPART 100 UNIT/ML ~~LOC~~ SOLN
0.0000 [IU] | Freq: Every day | SUBCUTANEOUS | Status: DC
  Administered 2019-10-12: 21:00:00 4 [IU] via SUBCUTANEOUS
  Administered 2019-10-13: 5 [IU] via SUBCUTANEOUS
  Administered 2019-10-14 – 2019-10-15 (×2): 4 [IU] via SUBCUTANEOUS
  Administered 2019-10-16 – 2019-10-18 (×3): 5 [IU] via SUBCUTANEOUS
  Administered 2019-10-19 – 2019-10-20 (×2): 3 [IU] via SUBCUTANEOUS
  Administered 2019-10-21 – 2019-10-22 (×2): 5 [IU] via SUBCUTANEOUS

## 2019-10-09 MED ORDER — ZOLPIDEM TARTRATE 5 MG PO TABS: 5.0000 mg | ORAL_TABLET | Freq: Every evening | ORAL | Status: DC | PRN

## 2019-10-09 MED ORDER — AMLODIPINE BESYLATE 5 MG PO TABS: 5.0000 mg | ORAL_TABLET | Freq: Every day | ORAL | Status: DC

## 2019-10-09 MED ORDER — ENOXAPARIN SODIUM 40 MG/0.4ML ~~LOC~~ SOLN: 40.0000 mg | SUBCUTANEOUS | Status: DC

## 2019-10-09 MED ORDER — FENOFIBRATE 160 MG PO TABS
160.0000 mg | ORAL_TABLET | Freq: Every day | ORAL | Status: DC
  Administered 2019-10-10 – 2019-10-23 (×14): 160 mg via ORAL
  Filled 2019-10-09 (×17): qty 1

## 2019-10-09 MED ORDER — OXYCODONE HCL 5 MG PO TABS: 5.0000 mg | ORAL_TABLET | ORAL | Status: DC | PRN

## 2019-10-09 MED ORDER — SODIUM CHLORIDE 0.9 % IV SOLN: 250.0000 mL | INTRAVENOUS | Status: DC | PRN

## 2019-10-09 MED ORDER — LABETALOL HCL 200 MG PO TABS
200.0000 mg | ORAL_TABLET | Freq: Three times a day (TID) | ORAL | Status: DC
  Administered 2019-10-09: 18:00:00 200 mg via ORAL
  Filled 2019-10-09: qty 1

## 2019-10-09 MED ORDER — ACETAMINOPHEN 325 MG PO TABS: 650.0000 mg | ORAL_TABLET | Freq: Four times a day (QID) | ORAL | Status: DC | PRN

## 2019-10-09 MED ORDER — FUROSEMIDE 10 MG/ML IJ SOLN
40.0000 mg | Freq: Once | INTRAMUSCULAR | Status: AC
  Administered 2019-10-09: 22:00:00 40 mg via INTRAVENOUS
  Filled 2019-10-09: qty 4

## 2019-10-09 MED ORDER — SODIUM CHLORIDE 0.9% FLUSH: 3.0000 mL | INTRAVENOUS | Status: DC | PRN

## 2019-10-09 MED ORDER — SODIUM CHLORIDE 0.9 % IV SOLN
500.0000 mg | INTRAVENOUS | Status: DC
  Administered 2019-10-09 – 2019-10-10 (×2): 500 mg via INTRAVENOUS
  Filled 2019-10-09 (×2): qty 500

## 2019-10-09 MED ORDER — BISACODYL 5 MG PO TBEC: 5.0000 mg | DELAYED_RELEASE_TABLET | Freq: Every day | ORAL | Status: DC | PRN

## 2019-10-09 MED ORDER — ONDANSETRON HCL 4 MG PO TABS: 4.0000 mg | ORAL_TABLET | Freq: Four times a day (QID) | ORAL | Status: DC | PRN

## 2019-10-09 MED ORDER — POLYETHYLENE GLYCOL 3350 17 G PO PACK: 17.0000 g | PACK | Freq: Every day | ORAL | Status: DC | PRN

## 2019-10-09 MED ORDER — ASPIRIN EC 81 MG PO TBEC
81.0000 mg | DELAYED_RELEASE_TABLET | Freq: Every day | ORAL | Status: DC
  Administered 2019-10-10 – 2019-10-23 (×14): 81 mg via ORAL
  Filled 2019-10-09 (×14): qty 1

## 2019-10-09 MED ORDER — ONDANSETRON HCL 4 MG/2ML IJ SOLN: 4.0000 mg | Freq: Four times a day (QID) | INTRAMUSCULAR | Status: DC | PRN

## 2019-10-09 MED ORDER — SODIUM CHLORIDE 0.9 % IV SOLN
200.0000 mg | Freq: Once | INTRAVENOUS | Status: AC
  Administered 2019-10-09: 200 mg via INTRAVENOUS
  Filled 2019-10-09: qty 200

## 2019-10-09 MED ORDER — INSULIN ASPART 100 UNIT/ML ~~LOC~~ SOLN
0.0000 [IU] | Freq: Three times a day (TID) | SUBCUTANEOUS | Status: DC
  Administered 2019-10-10 (×2): 3 [IU] via SUBCUTANEOUS
  Administered 2019-10-11: 2 [IU] via SUBCUTANEOUS
  Administered 2019-10-11: 5 [IU] via SUBCUTANEOUS
  Administered 2019-10-11 – 2019-10-12 (×2): 8 [IU] via SUBCUTANEOUS
  Administered 2019-10-12: 5 [IU] via SUBCUTANEOUS
  Administered 2019-10-13: 11 [IU] via SUBCUTANEOUS
  Administered 2019-10-13: 3 [IU] via SUBCUTANEOUS
  Administered 2019-10-13: 5 [IU] via SUBCUTANEOUS
  Administered 2019-10-14: 3 [IU] via SUBCUTANEOUS
  Administered 2019-10-14: 8 [IU] via SUBCUTANEOUS
  Administered 2019-10-14: 11 [IU] via SUBCUTANEOUS
  Administered 2019-10-15: 8 [IU] via SUBCUTANEOUS
  Administered 2019-10-15: 15 [IU] via SUBCUTANEOUS
  Administered 2019-10-15: 3 [IU] via SUBCUTANEOUS
  Administered 2019-10-16: 15 [IU] via SUBCUTANEOUS
  Administered 2019-10-16: 11 [IU] via SUBCUTANEOUS
  Administered 2019-10-16: 08:00:00 5 [IU] via SUBCUTANEOUS
  Administered 2019-10-17: 15 [IU] via SUBCUTANEOUS
  Administered 2019-10-17: 5 [IU] via SUBCUTANEOUS
  Administered 2019-10-17: 8 [IU] via SUBCUTANEOUS
  Administered 2019-10-18: 5 [IU] via SUBCUTANEOUS
  Administered 2019-10-18: 11 [IU] via SUBCUTANEOUS
  Administered 2019-10-18: 5 [IU] via SUBCUTANEOUS
  Administered 2019-10-19: 100 [IU] via SUBCUTANEOUS
  Administered 2019-10-19: 8 [IU] via SUBCUTANEOUS
  Administered 2019-10-19: 5 [IU] via SUBCUTANEOUS
  Administered 2019-10-20: 3 [IU] via SUBCUTANEOUS
  Administered 2019-10-20: 5 [IU] via SUBCUTANEOUS
  Administered 2019-10-20: 11 [IU] via SUBCUTANEOUS
  Administered 2019-10-21: 3 [IU] via SUBCUTANEOUS
  Administered 2019-10-21: 17:00:00 11 [IU] via SUBCUTANEOUS
  Administered 2019-10-21: 12:00:00 3 [IU] via SUBCUTANEOUS
  Administered 2019-10-22 (×2): 5 [IU] via SUBCUTANEOUS
  Administered 2019-10-22: 11 [IU] via SUBCUTANEOUS
  Administered 2019-10-23 (×3): 2 [IU] via SUBCUTANEOUS

## 2019-10-09 MED ORDER — SODIUM CHLORIDE 0.9 % IV SOLN
INTRAVENOUS | Status: DC
  Administered 2019-10-09: 17:00:00 via INTRAVENOUS

## 2019-10-09 NOTE — ED Provider Notes (Signed)
[facility] EMERGENCY DEPARTMENT Provider Note   CSN: [id number] Arrival date & time: 10/09/19  1344     History   Chief Complaint Chief Complaint  Patient presents with  . Cough  . Shortness of Breath    HPI Matthew Bates is a 73 y.o. male.     HPI Patient presents to the emergency room for evaluation of shortness of breath.  Patient states he started having cough and congestion several days ago.  Patient denied having any fevers.  He has not had any significant myalgias.  No loss of taste or smell.  His symptoms worsened this week and he started to feel short of breath.  He went to a medical facility and they noted that his oxygen saturation was in the 80s on room air.  Patient was sent to the ED for further evaluation. Past Medical History:  Diagnosis Date  . Anemia   . CAD (coronary artery disease)   . Carotid artery bruit   . Carotid artery occlusion   . Cataract   . Chronic kidney disease   . DM (diabetes mellitus) ([place])   . GERD (gastroesophageal reflux disease)   . Gout   . Hepatitis    as a child  . High triglycerides   . HTN (hypertension)   . Hyperlipemia   . Obesity     Patient Active Problem List   Diagnosis Date Noted  . Aftercare following surgery of the circulatory system, [place] 10/15/2013  . Occlusion and stenosis of carotid artery without mention of cerebral infarction 09/20/2011  . Carotid stenosis, bilateral 09/20/2011  . Hypertension 04/29/2011  . Coronary artery disease 04/29/2011  . Hyperlipidemia 04/29/2011  . Carotid stenosis 04/29/2011  . OSA (obstructive sleep apnea) 04/29/2011    Past Surgical History:  Procedure Laterality Date  . ANGIOPLASTY     --cardiac  . APPENDECTOMY    . CAROTID ENDARTERECTOMY  05/22/11   Left CEA  . CATARACT EXTRACTION Bilateral   . COLONOSCOPY  10-09-13  . KNEE SURGERY Right   . PR VEIN BYPASS GRAFT,AORTO-FEM-POP  2010  . spleen surgery          Home Medications    Prior to  Admission medications   Medication Sig Start Date End Date Taking? Authorizing Provider  amLODipine (NORVASC) 5 MG tablet TAKE 1 TABLET BY MOUTH TWICE A DAY 08/02/15   Nahser, Wonda Cheng, MD  aspirin EC 81 MG tablet Take 81 mg by mouth daily.    [provider]  Cyanocobalamin (B-12) 2500 MCG TABS Take 1 tablet by mouth daily.    [provider]  Famotidine-Ca Carb-Mag Hydrox (PEPCID COMPLETE PO) Take 1 tablet by mouth daily as needed.    [provider]  fenofibrate 160 MG tablet Take 1 tablet (160 mg total) by mouth daily. 12/04/11   Larey Dresser, MD  FERREX 150 150 MG capsule Take 1 tablet by mouth daily. 07/20/19   [provider]  folic acid (FOLVITE) 1 MG tablet Take 1 mg by mouth daily.      [provider]  furosemide (LASIX) 40 MG tablet Take 20 mg by mouth daily.  03/09/11   [provider]  labetalol (NORMODYNE) 200 MG tablet TAKE 1 TABLET BY MOUTH TWICE A DAY 08/02/15   Larey Dresser, MD  losartan (COZAAR) 100 MG tablet Take 100 mg by mouth daily.  04/28/11   [provider]  metFORMIN (GLUCOPHAGE) 1000 MG tablet Take 1,000 mg  by mouth 2 (two) times daily with a meal.  02/09/11   [provider]  metoprolol succinate (TOPROL-XL) 25 MG 24 hr tablet Take 50 mg by mouth daily.  04/28/11   [provider]  Multiple Vitamin (MULTIVITAMIN) capsule Take 1 capsule by mouth daily.      [provider]  rosuvastatin (CRESTOR) 20 MG tablet Take 20 mg by mouth daily. 02/11/16   [provider]    Family History Family History  Problem Relation Age of Onset  . Heart attack Father   . Hyperlipidemia Father   . Hypertension Father   . Heart disease Father        Heart Disease before age 58- PVD  . Diabetes Brother   . Heart disease Brother        Heart Disese before age 78  . Hyperlipidemia Brother   . Hypertension Brother   . Leukemia Brother   . Heart disease Mother        Heart Disease before  age 30  . Hyperlipidemia Mother   . Hypertension Mother   . Heart attack Mother   . Coronary artery disease Other   . Colon cancer Neg Hx   . Esophageal cancer Neg Hx   . Pancreatic cancer Neg Hx   . Stomach cancer Neg Hx   . Liver disease Neg Hx   . Rectal cancer Neg Hx     Social History Social History   Tobacco Use  . Smoking status: Former Smoker    Types: Cigarettes    Quit date: 11/04/1970    Years since quitting: 48.9  . Smokeless tobacco: Never Used  Substance Use Topics  . Alcohol use: Yes    Comment: rare  . Drug use: No     Allergies   Metoclopramide hcl   Review of Systems Review of Systems  All other systems reviewed and are negative.    Physical Exam Updated Vital Signs BP (!) 157/92   Pulse (!) 29   Temp 98.6 F (37 C)   Resp (!) 28   SpO2 98%   Physical Exam Vitals signs and nursing note reviewed.  Constitutional:      General: He is not in acute distress.    Appearance: He is well-developed.  HENT:     Head: Normocephalic and atraumatic.     Right Ear: External ear normal.     Left Ear: External ear normal.  Eyes:     General: No scleral icterus.       Right eye: No discharge.        Left eye: No discharge.     Conjunctiva/sclera: Conjunctivae normal.  Neck:     Musculoskeletal: Neck supple.     Trachea: No tracheal deviation.  Cardiovascular:     Rate and Rhythm: Normal rate and regular rhythm.  Pulmonary:     Effort: Pulmonary effort is normal. No respiratory distress.     Breath sounds: No stridor. Rales present.  Abdominal:     General: Bowel sounds are normal. There is no distension.     Palpations: Abdomen is soft.     Tenderness: There is no abdominal tenderness. There is no guarding or rebound.  Musculoskeletal:        General: No tenderness.  Skin:    General: Skin is warm and dry.     Findings: No rash.  Neurological:     Mental Status: He is alert.     Cranial Nerves: No cranial nerve  deficit (no facial droop,  extraocular movements intact, no slurred speech).     Sensory: No sensory deficit.     Motor: No abnormal muscle tone or seizure activity.     Coordination: Coordination normal.      ED Treatments / Results  Labs (all labs ordered are listed, but only abnormal results are displayed) Labs Reviewed  COMPREHENSIVE METABOLIC PANEL - Abnormal; Notable for the following components:      Result Value   Sodium 132 (*)    CO2 18 (*)    Glucose, Bld 152 (*)    BUN 24 (*)    Creatinine, Ser 1.97 (*)    Albumin 3.1 (*)    AST 55 (*)    GFR calc non Af Amer 33 (*)    GFR calc Af Amer 38 (*)    All other components within normal limits  CBC WITH DIFFERENTIAL/PLATELET - Abnormal; Notable for the following components:   Hemoglobin 11.7 (*)    HCT 37.6 (*)    MCH 25.2 (*)    RDW 16.4 (*)    All other components within normal limits  POC SARS CORONAVIRUS 2 AG -  ED    EKG EKG Interpretation  Date/Time:  Saturday October 09 2019 14:13:42 EST Ventricular Rate:  100 PR Interval:  150 QRS Duration: 82 QT Interval:  344 QTC Calculation: 443 R Axis:   88 Text Interpretation: Sinus rhythm with occasional Premature ventricular complexes Nonspecific ST abnormality Abnormal ECG No significant change since last tracing Confirmed by Dorie Rank [phone]) on 10/09/2019 3:44:50 PM   Radiology Dg Chest 2 View  Result Date: 10/09/2019 CLINICAL DATA:  Pt arrives via [facility] from his pcp office, pt reports cough since tuesday, O2 sat was 88% on room air, placed on 4L O2 via n.c. and O2 sat improved to 93%. bp 130/72, HR 90 NSR, CBG 153, Temp 99.9. pt denies any cp, sob, body aches or chills. EXAM: CHEST - 2 VIEW COMPARISON:  05/21/2011 FINDINGS: New interstitial and patchy alveolar opacities in the right mid and lower lung. Mild interstitial prominence at the left lung base. Heart size and mediastinal contours are within normal limits. Previous median sternotomy and CABG. No effusion. No pneumothorax.  Visualized bones unremarkable. IMPRESSION: New interstitial and patchy alveolar opacities predominantly in the right mid and lower lung suspicious for multifocal or viral pneumonia. Electronically Signed   By: Lucrezia Europe M.D.   On: 10/09/2019 14:35    Procedures Procedures (including critical care time)  Medications Ordered in ED Medications  dexamethasone (DECADRON) injection 6 mg (6 mg Intravenous Given 10/09/19 1543)     Initial Impression / Assessment and Plan / ED Course  I have reviewed the triage vital signs and the nursing notes.  Pertinent labs & imaging results that were available during my care of the patient were reviewed by me and considered in my medical decision making (see chart for details).   Patient presents with cough congestion and dyspnea.  Patient has a new oxygen requirement.  His chest x-ray shows abnormality suggestive of a multifocal or viral pneumonia.  Lab tests are notable for an elevated BUN and creatinine and decreased bicarb suggesting component of acute kidney injury although no old labs are available for comparison.  Patient is tachypneic but otherwise hemodynamically stable.  COVID-19 is likely.  IV Decadron ordered.  I Have also ordered a rapid antigen test.  Will wait on that result, will start empiric abd if rapid antigen test is  negative and will send off hologic covid test. Plan on admission to the hospital for further treatment.  NYJAH STIRLING was evaluated in Emergency Department on 10/09/2019 for the symptoms described in the history of present illness. He was evaluated in the context of the global COVID-19 pandemic, which necessitated consideration that the patient might be at risk for infection with the SARS-CoV-2 virus that causes COVID-19. Institutional protocols and algorithms that pertain to the evaluation of patients at risk for COVID-19 are in a state of rapid change based on information released by regulatory bodies including the CDC and federal  and state organizations. These policies and algorithms were followed during the patient's care in the ED.   Final Clinical Impressions(s) / ED Diagnoses   Final diagnoses:  Multifocal pneumonia      Dorie Rank, MD 10/09/19 1545

## 2019-10-09 NOTE — ED Notes (Signed)
ED TO INPATIENT HANDOFF REPORT  ED Nurse Name and Phone #: [phone]  S Name/Age/Gender Matthew Bates 73 y.o. male Room/Bed: 021C/021C  Code Status   Code Status: Full Code  Home/SNF/Other Home Patient oriented to: self, place, time and situation Is this baseline? Yes   Triage Complete: Triage complete  Chief Complaint COUGH  Triage Note Pt arrives via [facility] from his pcp office, pt reports cough since tuesday, O2 sat was 88% on room air, placed on 4L O2 via n.c. and O2 sat improved to 93%. bp 130/72, HR 90 NSR, CBG 153, Temp 99.9. pt denies any cp, sob, body aches or chills.    Allergies Allergies  Allergen Reactions  . Metoclopramide Hcl Hypertension    Reglan=Increases HBP symptoms    Level of Care/Admitting Diagnosis ED Disposition    ED Disposition Condition [facility] Area: [place] [[id number]]  Level of Care: Progressive [102]  Covid Evaluation: Confirmed COVID Positive  Diagnosis: Acute respiratory disease due to COVID-19 virus [[id number]]  Admitting Physician: Karmen Bongo [[id number]]  Attending Physician: Karmen Bongo [[id number]]  Estimated length of stay: 5 - 7 days  Certification:: I certify this patient will need inpatient services for at least 2 midnights  PT Class (Do Not Modify): Inpatient [101]  PT Acc Code (Do Not Modify): Private [1]       B Medical/Surgery History Past Medical History:  Diagnosis Date  . Anemia   . CAD (coronary artery disease)   . Carotid artery occlusion   . Cataract   . Chronic kidney disease   . DM (diabetes mellitus) ([place])   . GERD (gastroesophageal reflux disease)   . Gout   . Hepatitis    as a child  . High triglycerides   . HTN (hypertension)   . Obesity    Past Surgical History:  Procedure Laterality Date  . ANGIOPLASTY     --cardiac  . APPENDECTOMY    . CAROTID ENDARTERECTOMY  05/22/11   Left CEA  . CATARACT EXTRACTION Bilateral   . COLONOSCOPY  10-09-13  . KNEE  SURGERY Right   . PR VEIN BYPASS GRAFT,AORTO-FEM-POP  2010  . spleen surgery       A IV Location/Drains/Wounds Patient Lines/Drains/Airways Status   Active Line/Drains/Airways    Name:   Placement date:   Placement time:   Site:   Days:   Peripheral IV 10/09/19 Right Antecubital   10/09/19    1535    Antecubital   less than 1          Intake/Output Last 24 hours No intake or output data in the 24 hours ending 10/09/19 1844  Labs/Imaging Results for orders placed or performed during the hospital encounter of 10/09/19 (from the past 48 hour(s))  Comprehensive metabolic panel     Status: Abnormal   Collection Time: 10/09/19  2:07 PM  Result Value Ref Range   Sodium 132 (L) 135 - 145 mmol/L   Potassium 3.8 3.5 - 5.1 mmol/L   Chloride 102 98 - 111 mmol/L   CO2 18 (L) 22 - 32 mmol/L   Glucose, Bld 152 (H) 70 - 99 mg/dL   BUN 24 (H) 8 - 23 mg/dL   Creatinine, Ser 1.97 (H) 0.61 - 1.24 mg/dL   Calcium 9.3 8.9 - 10.3 mg/dL   Total Protein 7.2 6.5 - 8.1 g/dL   Albumin 3.1 (L) 3.5 - 5.0 g/dL   AST 55 (H) 15 - 41 U/L  ALT 21 0 - 44 U/L   Alkaline Phosphatase 49 38 - 126 U/L   Total Bilirubin 0.9 0.3 - 1.2 mg/dL   GFR calc non Af Amer 33 (L) >60 mL/min   GFR calc Af Amer 38 (L) >60 mL/min   Anion gap 12 5 - 15    Comment: Performed at [facility] [address], [place], [place] [address]  CBC with Differential     Status: Abnormal   Collection Time: 10/09/19  2:07 PM  Result Value Ref Range   WBC 8.7 4.0 - 10.5 K/uL   RBC 4.65 4.22 - 5.81 MIL/uL   Hemoglobin 11.7 (L) 13.0 - 17.0 g/dL   HCT 37.6 (L) 39.0 - 52.0 %   MCV 80.9 80.0 - 100.0 fL   MCH 25.2 (L) 26.0 - 34.0 pg   MCHC 31.1 30.0 - 36.0 g/dL   RDW 16.4 (H) 11.5 - 15.5 %   Platelets 243 150 - 400 K/uL   nRBC 0.0 0.0 - 0.2 %   Neutrophils Relative % 82 %   Neutro Abs 7.1 1.7 - 7.7 K/uL   Lymphocytes Relative 13 %   Lymphs Abs 1.1 0.7 - 4.0 K/uL   Monocytes Relative 4 %   Monocytes Absolute 0.3 0.1 - 1.0 K/uL    Eosinophils Relative 0 %   Eosinophils Absolute 0.0 0.0 - 0.5 K/uL   Basophils Relative 0 %   Basophils Absolute 0.0 0.0 - 0.1 K/uL   Immature Granulocytes 1 %   Abs Immature Granulocytes 0.04 0.00 - 0.07 K/uL    Comment: Performed at [facility] [address] [place], [place] [address]  POC SARS Coronavirus 2 Ag-ED - Nasal Swab (BD Veritor Kit)     Status: Abnormal   Collection Time: 10/09/19  4:19 PM  Result Value Ref Range   SARS Coronavirus 2 Ag POSITIVE (A) NEGATIVE    Comment: (NOTE) SARS-CoV-2 antigen PRESENT. Positive results indicate the presence of viral antigens, but clinical correlation with patient history and other diagnostic information is necessary to determine patient infection status.  Positive results do not rule out bacterial infection or co-infection  with other viruses. False positive results are rare but can occur, and confirmatory RT-PCR testing may be appropriate in some circumstances. The expected result is Negative. Fact Sheet for Patients: PodPark.tn Fact Sheet for Providers: GiftContent.is  This test is not yet approved or cleared by the [place] FDA and  has been authorized for detection and/or diagnosis of SARS-CoV-2 by FDA under an Emergency Use Authorization (EUA).  This EUA will remain in effect (meaning this test can be used) for the duration of  the COVID-19 declaration under Section 564(b)(1) of the Act, 21 U.S.C. section 360bbb-3(b)(1), unless the a uthorization is terminated or revoked sooner.   Hemoglobin A1c     Status: Abnormal   Collection Time: 10/09/19  5:41 PM  Result Value Ref Range   Hgb A1c MFr Bld 7.2 (H) 4.8 - 5.6 %    Comment: (NOTE) Pre diabetes:          5.7%-6.4% Diabetes:              >6.4% Glycemic control for   <7.0% adults with diabetes    Mean Plasma Glucose 159.94 mg/dL    Comment: Performed at [facility] [address],  [place], [place] [address]   Dg Chest 2 View  Result Date: 10/09/2019 CLINICAL DATA:  Pt arrives via [facility]  from his pcp office, pt reports cough since tuesday, O2 sat was 88% on room air, placed on 4L O2 via n.c. and O2 sat improved to 93%. bp 130/72, HR 90 NSR, CBG 153, Temp 99.9. pt denies any cp, sob, body aches or chills. EXAM: CHEST - 2 VIEW COMPARISON:  05/21/2011 FINDINGS: New interstitial and patchy alveolar opacities in the right mid and lower lung. Mild interstitial prominence at the left lung base. Heart size and mediastinal contours are within normal limits. Previous median sternotomy and CABG. No effusion. No pneumothorax. Visualized bones unremarkable. IMPRESSION: New interstitial and patchy alveolar opacities predominantly in the right mid and lower lung suspicious for multifocal or viral pneumonia. Electronically Signed   By: Lucrezia Europe M.D.   On: 10/09/2019 14:35    Pending Labs Unresulted Labs (From admission, onward)    Start     Ordered   10/10/19 0500  CBC with Differential/Platelet  Daily,   R     10/09/19 1709   10/10/19 0500  Comprehensive metabolic panel  Daily,   R     10/09/19 1709   10/10/19 0500  C-reactive protein  Daily,   R     10/09/19 1709   10/10/19 0500  D-dimer, quantitative (not at [facility])  Daily,   R     10/09/19 1709   10/10/19 0500  Ferritin  Daily,   R     10/09/19 1709   10/10/19 0500  Magnesium  Daily,   R     10/09/19 1709   10/10/19 0500  Phosphorus  Daily,   R     10/09/19 1709   10/09/19 1709  Brain natriuretic peptide  Once,   STAT     10/09/19 1709   10/09/19 1709  C-reactive protein  Once,   STAT     10/09/19 1709   10/09/19 1709  D-dimer, quantitative (not at [facility])  Once,   STAT     10/09/19 1709   10/09/19 1709  Ferritin  Once,   STAT     10/09/19 1709   10/09/19 1709  Fibrinogen  Once,   STAT     10/09/19 1709   10/09/19 1709  Lactate dehydrogenase  Once,   STAT     10/09/19 1709   10/09/19 1709  Procalcitonin  Once,   STAT      10/09/19 1709          Vitals/Pain Today's Vitals   10/09/19 1715 10/09/19 1730 10/09/19 1745 10/09/19 1830  BP: (!) 134/117 (!) 161/90 (!) 161/87 121/86  Pulse: (!) 104 (!) 105 (!) 107 92  Resp:  (!) 29  (!) 28  Temp:      SpO2: 91% 92% (!) 89% 92%  PainSc:        Isolation Precautions Airborne and Contact precautions  Medications Medications  aspirin EC tablet 81 mg (has no administration in time range)  amLODipine (NORVASC) tablet 5 mg (has no administration in time range)  fenofibrate tablet 160 mg (has no administration in time range)  labetalol (NORMODYNE) tablet 200 mg (200 mg Oral Given 10/09/19 1739)  rosuvastatin (CRESTOR) tablet 20 mg (has no administration in time range)  famotidine (PEPCID) tablet 40 mg (has no administration in time range)  enoxaparin (LOVENOX) injection 40 mg (has no administration in time range)  sodium chloride flush (NS) 0.9 % injection 3 mL (has no administration in time range)  sodium chloride flush (NS) 0.9 % injection 3 mL (has no administration in time  range)  0.9 %  sodium chloride infusion (has no administration in time range)  acetaminophen (TYLENOL) tablet 650 mg (has no administration in time range)  oxyCODONE (Oxy IR/ROXICODONE) immediate release tablet 5 mg (has no administration in time range)  zolpidem (AMBIEN) tablet 5 mg (has no administration in time range)  docusate sodium (COLACE) capsule 100 mg (has no administration in time range)  polyethylene glycol (MIRALAX / GLYCOLAX) packet 17 g (has no administration in time range)  bisacodyl (DULCOLAX) EC tablet 5 mg (has no administration in time range)  ondansetron (ZOFRAN) tablet 4 mg (has no administration in time range)    Or  ondansetron (ZOFRAN) injection 4 mg (has no administration in time range)  insulin aspart (novoLOG) injection 0-15 Units (has no administration in time range)  sodium chloride flush (NS) 0.9 % injection 3 mL (has no administration in time range)   remdesivir 200 mg in sodium chloride 0.9% 250 mL IVPB (0 mg Intravenous Stopped 10/09/19 1838)    Followed by  remdesivir 100 mg in sodium chloride 0.9 % 100 mL IVPB (has no administration in time range)  albuterol (VENTOLIN HFA) 108 (90 Base) MCG/ACT inhaler 2 puff (has no administration in time range)  dexamethasone (DECADRON) injection 6 mg (has no administration in time range)  insulin aspart (novoLOG) injection 0-5 Units (has no administration in time range)  dexamethasone (DECADRON) injection 6 mg (6 mg Intravenous Given 10/09/19 1543)    Mobility walks Low fall risk   Focused Assessments Pulmonary Assessment Handoff:  Lung sounds:   O2 Device: Nasal Cannula O2 Flow Rate (L/min): 4 L/min      R Recommendations: See Admitting Provider Note  Report given to:   Additional Notes:

## 2019-10-09 NOTE — Consult Note (Signed)
NAME:  Matthew Bates, MRN:  [id number], DOB:  Dec 17, 1945, LOS: 0 ADMISSION DATE:  10/09/2019, CONSULTATION DATE:  10/09/19 REFERRING MD:  Dr. Lorin Mercy, CHIEF COMPLAINT:  Dyspnea   Brief History   73 y.o. M with PMH of HTN, HL, CAD, carotid disease, DM, GERD, CKD3 who presented to the ED with 2-3 days of shortness of breath and chest tightness.  Found to be Covid + with infiltrates on CXR and was admitted to internal medicine.  PCCM consulted for increasing oxygen requirement.   History of present illness   Matthew Bates is a 73 y.o. M with PMH of  HTN, HL, CAD, carotid disease, DM, GERD, CKD3 who presented to the ED with shortness of breath.  Was around ill contacts at Thanksgiving and developed a cough on 12/1. Denies fever. Denies change in taste/smell, nausea/vomiting, edema. He does feel lightheaded when lying flat and has had to sleep on 2 pillows to prevent this. Denies palpitations. He has had pink mucous when he coughs. No frank blood. On arrival oxygen saturation was 88% on RA and was placed on 4L [place].  He developed chest tightness while in the ED and troponin elevated at 2,549, EKG with non-specific ST changes.  Creatinine elevated to 1.9 with elevated inflammatory markers.   He was given Decadron, Remdesevir, Azithromycin, Ceftriaxone and started on a heparin gtt  Past Medical History   has a past medical history of Anemia, CAD (coronary artery disease), Carotid artery occlusion, Cataract, Chronic kidney disease, DM (diabetes mellitus) ([place]), GERD (gastroesophageal reflux disease), Gout, Hepatitis, High triglycerides, HTN (hypertension), and Obesity.  Significant Hospital Events   12/5 Admit to Internal Medicine, PCCM consult   Consults:  PCCM 12/5> Cardiology 12/5>  Procedures:  None  Significant Diagnostic Tests:  12/5 CXR > New interstitial and patchy alveolar opacities predominantly in the right mid and lower lung.  Micro Data:  COVID19 12/5> positive  Antimicrobials:   Ceftriaxone 12/5> Azithromycin 12/5>  Interim history/subjective:  Lying in bed, chest tightness has resolved.  Objective   Blood pressure 106/82, pulse 75, temperature 98.6 F (37 C), resp. rate 20, height 5\' 9"  (1.753 m), weight 85.7 kg, SpO2 98 %.       No intake or output data in the 24 hours ending 10/09/19 2049 Filed Weights   10/09/19 1900  Weight: 85.7 kg    Examination: General: NAD HENT: [place]/AT, EOMI, anicteric sclera Lungs: Rhonchi and rales diffusely, rare wheeze. Breathing comfortably on NRB Cardiovascular: RRR, no LE edema Abdomen: Soft, nontender, nondistended. Well healed midline scar. Extremities: No rashes, warm and well perfused Neuro: Awake and alert, oriented x 3, able to follow commands and no gross deficits  Assessment & Plan:  42M w/ history of HTN, HL, CAD, carotid disease, DM, GERD, CKD3 who presented to the ED with 2-3 days of shortness of breath and chest tightness.  Found to be Covid + and having an NSTEMI.  Acute hypoxic respiratory failure, COVID19 pneumonia, NSTEMI, mild hemoptysis: Afebrile. Hemodynamically stable. O2 sat 96% on NRB. CXR with right mid and lower lung opacities. No leukocytosis. BNP 1496.3. Trop 2,549. Procal 0.76 --Continue dexamethasone, remdesivir --Obtain TTE --Bates try some gentle diuresis as he may have some pulmonary edema. Can aim for goal net even for now. Monitor ins/outs and daily weights. --Continue ceftriaxone and azithromycin. --Trend trops and EKG --Continue heparin gtt for now but would be cautious in setting of mild hemoptysis.  --Continue ASA/statin --Hold amlodipine and labetalol as we try some diuresis.  --  Titrate supplemental oxygen for goal O2 sat >90%. Can escalate to BiPAP if needed.  AKI on CKD3, hyponatremia, metabolic acidosis without increased anion gap: Cr 1.97 and baseline appears to be around 1.3 though no recent labs (last cr around 5 years ago). Bicarb 18 with anion gap of 12.  Transaminitis:  Mild transaminitis with AST 55. Bates need to monitor while on remdesivir. --Consider screening for viral hepatitis  DM: on SSI  Chronic normocytic anemia: Hgb stable around baseline of 11.  Best practice:  Diet: HH/carb mod Pain/Anxiety/Delirium protocol (if indicated): N/A VAP protocol (if indicated): N/A DVT prophylaxis: hep gtt GI prophylaxis: N/A Glucose control: SSI Mobility: OOB as tolerated Code Status: FULL Family Communication: Discussed plan with patient Disposition: Step down under TRH  Labs   CBC: Recent Labs  Lab 10/09/19 1407 10/09/19 2007  WBC 8.7  --   NEUTROABS 7.1  --   HGB 11.7* 11.6*  HCT 37.6* 34.0*  MCV 80.9  --   PLT 243  --     Basic Metabolic Panel: Recent Labs  Lab 10/09/19 1407 10/09/19 2007  NA 132* 132*  K 3.8 3.9  CL 102  --   CO2 18*  --   GLUCOSE 152*  --   BUN 24*  --   CREATININE 1.97*  --   CALCIUM 9.3  --    GFR: Estimated Creatinine Clearance: 35.7 mL/min (A) (by C-G formula based on SCr of 1.97 mg/dL (H)). Recent Labs  Lab 10/09/19 1407 10/09/19 1741  PROCALCITON  --  0.76  WBC 8.7  --     Liver Function Tests: Recent Labs  Lab 10/09/19 1407  AST 55*  ALT 21  ALKPHOS 49  BILITOT 0.9  PROT 7.2  ALBUMIN 3.1*    ABG    Component Value Date/Time   PHART 7.342 (L) 10/09/2019 2007   PCO2ART 29.3 (L) 10/09/2019 2007   PO2ART 89.0 10/09/2019 2007   HCO3 15.9 (L) 10/09/2019 2007   TCO2 17 (L) 10/09/2019 2007   ACIDBASEDEF 9.0 (H) 10/09/2019 2007   O2SAT 97.0 10/09/2019 2007     HbA1C: Hgb A1c MFr Bld  Date/Time Value Ref Range Status  10/09/2019 05:41 PM 7.2 (H) 4.8 - 5.6 % Final    Comment:    (NOTE) Pre diabetes:          5.7%-6.4% Diabetes:              >6.4% Glycemic control for   <7.0% adults with diabetes   03/20/2009 03:54 PM (H) 4.6 - 6.1 % Final   6.6 (NOTE) The ADA recommends the following therapeutic goal for glycemic control related to Hgb A1c measurement: Goal of therapy: <6.5 Hgb  A1c  Reference: American Diabetes Association: Clinical Practice Recommendations 2010, Diabetes Care, 2010, 33: (Suppl  1).     Review of Systems:   12 point review of system negative except per HPI  Past Medical History  He,  has a past medical history of Anemia, CAD (coronary artery disease), Carotid artery occlusion, Cataract, Chronic kidney disease, DM (diabetes mellitus) ([place]), GERD (gastroesophageal reflux disease), Gout, Hepatitis, High triglycerides, HTN (hypertension), and Obesity.   Surgical History    Past Surgical History:  Procedure Laterality Date  . ANGIOPLASTY     --cardiac  . APPENDECTOMY    . CAROTID ENDARTERECTOMY  05/22/11   Left CEA  . CATARACT EXTRACTION Bilateral   . COLONOSCOPY  10-09-13  . KNEE SURGERY Right   . PR VEIN BYPASS  GRAFT,AORTO-FEM-POP  2010  . spleen surgery       Social History   reports that he quit smoking about 48 years ago. His smoking use included cigarettes. He has never used smokeless tobacco. He reports current alcohol use. He reports that he does not use drugs.   Family History   His family history includes Coronary artery disease in an other family member; Diabetes in his brother; Heart attack in his father and mother; Heart disease in his brother, father, and mother; Hyperlipidemia in his brother, father, and mother; Hypertension in his brother, father, and mother; Leukemia in his brother. There is no history of Colon cancer, Esophageal cancer, Pancreatic cancer, Stomach cancer, Liver disease, or Rectal cancer.   Allergies Allergies  Allergen Reactions  . Metoclopramide Hcl Hypertension    Reglan=Increases HBP symptoms     Home Medications  Prior to Admission medications   Medication Sig Start Date End Date Taking? Authorizing Provider  amLODipine (NORVASC) 5 MG tablet TAKE 1 TABLET BY MOUTH TWICE A DAY Patient taking differently: Take 5 mg by mouth daily.  08/02/15  Yes Nahser, Wonda Cheng, MD  aspirin EC 81 MG tablet Take 81  mg by mouth daily.   Yes [provider]  Cyanocobalamin (B-12) 2500 MCG TABS Take 2,500 mcg by mouth daily.    Yes [provider]  famotidine (PEPCID) 40 MG tablet Take 40 mg by mouth 2 (two) times daily.   Yes [provider]  fenofibrate 160 MG tablet Take 1 tablet (160 mg total) by mouth daily. 12/04/11  Yes Larey Dresser, MD  FERREX 150 150 MG capsule Take 150 mg by mouth daily.  07/20/19  Yes [provider]  folic acid (FOLVITE) 1 MG tablet Take 1 mg by mouth daily.     Yes [provider]  furosemide (LASIX) 40 MG tablet Take 40 mg by mouth daily.  03/09/11  Yes [provider]  labetalol (NORMODYNE) 200 MG tablet TAKE 1 TABLET BY MOUTH TWICE A DAY Patient taking differently: Take 200 mg by mouth 3 (three) times daily.  08/02/15  Yes Larey Dresser, MD  losartan (COZAAR) 100 MG tablet Take 100 mg by mouth daily.  04/28/11  Yes [provider]  metFORMIN (GLUCOPHAGE) 1000 MG tablet Take 1,000 mg by mouth 2 (two) times daily with a meal.  02/09/11  Yes [provider]  Multiple Vitamin (MULTIVITAMIN) capsule Take 1 capsule by mouth daily.     Yes [provider]  rosuvastatin (CRESTOR) 20 MG tablet Take 20 mg by mouth daily. 02/11/16  Yes [provider]     Critical care time: The patient is critically ill with multiple organ systems failure and requires high complexity decision making for assessment and support, frequent evaluation and titration of therapies, application of advanced monitoring technologies and extensive interpretation of multiple databases.   Critical Care Time devoted to patient care services described in this note is  30 Minutes. This time reflects time of care of this signee. This critical care time does not reflect procedure time, or teaching time or supervisory time of PA/NP/Med student/Med Resident etc but could involve care discussion time.  Jacques Earthly, M.D. [facility]  Pulmonary/Critical Care Medicine After hours pager: [phone].

## 2019-10-09 NOTE — ED Provider Notes (Signed)
Patient CARE signed out to follow-up Covid test result and reassess.  On reassessment patient in the 64s without oxygen.  Patient requiring 2 to 3 L nasal cannula.  Updated patient on positive Covid test.  Paged hospitalist for admission.  Patient has acute renal failure on blood work.  Normal saline 125 mL/h ordered.  REGINALDO BRINTON was evaluated in Emergency Department on 10/09/2019 for the symptoms described in the history of present illness. He was evaluated in the context of the global COVID-19 pandemic, which necessitated consideration that the patient might be at risk for infection with the SARS-CoV-2 virus that causes COVID-19. Institutional protocols and algorithms that pertain to the evaluation of patients at risk for COVID-19 are in a state of rapid change based on information released by regulatory bodies including the CDC and federal and state organizations. These policies and algorithms were followed during the patient's care in the ED.   The patients results and plan were reviewed and discussed.   Any x-rays performed were independently reviewed by myself.   Differential diagnosis were considered with the presenting HPI.  Medications  0.9 %  sodium chloride infusion (has no administration in time range)  dexamethasone (DECADRON) injection 6 mg (6 mg Intravenous Given 10/09/19 1543)    Vitals:   10/09/19 1402 10/09/19 1451 10/09/19 1454  BP: (!) 156/84 (!) 157/92   Pulse: 94 (!) 101 (!) 29  Resp: 16 (!) 30 (!) 28  Temp: 98.6 F (37 C)    SpO2: 94% 98% 98%    Final diagnoses:  Multifocal pneumonia  Hypoxia  COVID-19    Admission/ observation were discussed with the admitting physician, patient and/or family and they are comfortable with the plan.     Elnora Morrison, MD 10/11/19 [phone]

## 2019-10-09 NOTE — Progress Notes (Signed)
ANTICOAGULATION CONSULT NOTE - Initial Consult  Pharmacy Consult for heparin Indication: chest pain/ACS  Allergies  Allergen Reactions  . Metoclopramide Hcl Hypertension    Reglan=Increases HBP symptoms    Patient Measurements: Height: 5\' 9"  (175.3 cm) Weight: 189 lb (85.7 kg) IBW/kg (Calculated) : 70.7 Heparin Dosing Weight: 85.7kg  Vital Signs: Temp: 98.6 F (37 C) (12/05 1402) BP: 121/86 (12/05 1830) Pulse Rate: 92 (12/05 1830)  Labs: Recent Labs    10/09/19 1407 10/09/19 1741  HGB 11.7*  --   HCT 37.6*  --   PLT 243  --   CREATININE 1.97*  --   TROPONINIHS  --  2,549*    Estimated Creatinine Clearance: 35.7 mL/min (A) (by C-G formula based on SCr of 1.97 mg/dL (H)).   Medical History: Past Medical History:  Diagnosis Date  . Anemia   . CAD (coronary artery disease)   . Carotid artery occlusion   . Cataract   . Chronic kidney disease   . DM (diabetes mellitus) ([place])   . GERD (gastroesophageal reflux disease)   . Gout   . Hepatitis    as a child  . High triglycerides   . HTN (hypertension)   . Obesity     Medications:  Infusions:  . sodium chloride    . azithromycin    . cefTRIAXone (ROCEPHIN)  IV 2 g (10/09/19 1923)  . heparin    . [START ON 10/10/2019] remdesivir 100 mg in NS 100 mL      Assessment: 5 yom presented to the ED with cough and SOB. Found to have [place]. Also with elevated troponin and starting IV heparin. Hgb is slightly low and platelets are WNL. He is not on anticoagulation PTA.   Goal of Therapy:  Heparin level 0.3-0.7 units/ml Monitor platelets by anticoagulation protocol: Yes   Plan:  Heparin bolus 4000 units IV x 1 Heparin gtt 1000 units/hr Check an 8 hr heparin level Daily heparin level and CBC  Gene Glazebrook, Rande Lawman 10/09/2019,7:33 PM

## 2019-10-09 NOTE — ED Notes (Signed)
MD informed of critical troponin level

## 2019-10-09 NOTE — ED Triage Notes (Addendum)
Pt arrives via [facility] from his pcp office, pt reports cough since tuesday, O2 sat was 88% on room air, placed on 4L O2 via n.c. and O2 sat improved to 93%. bp 130/72, HR 90 NSR, CBG 153, Temp 99.9. pt denies any cp, sob, body aches or chills.

## 2019-10-09 NOTE — H&P (Signed)
History and Physical    ISAIR Bates [id number] DOB: [birth year] DOA: 10/09/2019  PCP: Marton Redwood, MD Consultants:  Henrene Pastor - GI; Nickel - vascular Patient coming from:  Home - lives with wife; NOK: Wife, Mariann Laster [phone]  Chief Complaint: SOB  HPI: Matthew Bates is a 73 y.o. male with medical history significant of obesity; HTN; HLD; DM; carotid stenosis; CAD; and CKD presenting with SOB.  He reports 2-3 days of SOB, fatigue.  No fever.  +sick contacts - had Thanksgiving with his sister and BIL and he had a cough.  His wife has URI symptoms now as well.  While in the ER he has noticed a persistent chest tightness that is not exertional or relieved with rest.    ED Course:  +COVID - SOB, cough, low-mid 80s, on 2-3L [place] O2.  Mild tachypnea, able to chat.  Mild AKI.  On MIVF.   CXR with multifocal PNA.  Review of Systems: As per HPI; otherwise review of systems reviewed and negative.   Ambulatory Status:  Ambulates without assistance  Past Medical History:  Diagnosis Date  . Anemia   . CAD (coronary artery disease)   . Carotid artery occlusion   . Cataract   . Chronic kidney disease   . DM (diabetes mellitus) ([place])   . GERD (gastroesophageal reflux disease)   . Gout   . Hepatitis    as a child  . High triglycerides   . HTN (hypertension)   . Obesity     Past Surgical History:  Procedure Laterality Date  . ANGIOPLASTY     --cardiac  . APPENDECTOMY    . CAROTID ENDARTERECTOMY  05/22/11   Left CEA  . CATARACT EXTRACTION Bilateral   . COLONOSCOPY  10-09-13  . KNEE SURGERY Right   . PR VEIN BYPASS GRAFT,AORTO-FEM-POP  2010  . spleen surgery      Social History   Socioeconomic History  . Marital status: Married    Spouse name: Not on file  . Number of children: Not on file  . Years of education: Not on file  . Highest education level: Not on file  Occupational History  . Not on file  Social Needs  . Financial resource strain: Not on file  . Food insecurity     Worry: Not on file    Inability: Not on file  . Transportation needs    Medical: Not on file    Non-medical: Not on file  Tobacco Use  . Smoking status: Former Smoker    Types: Cigarettes    Quit date: 11/04/1970    Years since quitting: 48.9  . Smokeless tobacco: Never Used  Substance and Sexual Activity  . Alcohol use: Yes    Comment: rare  . Drug use: No  . Sexual activity: Not on file  Lifestyle  . Physical activity    Days per week: Not on file    Minutes per session: Not on file  . Stress: Not on file  Relationships  . Social Herbalist on phone: Not on file    Gets together: Not on file    Attends religious service: Not on file    Active member of club or organization: Not on file    Attends meetings of clubs or organizations: Not on file    Relationship status: Not on file  . Intimate partner violence    Fear of current or ex partner: Not on file    Emotionally abused:  Not on file    Physically abused: Not on file    Forced sexual activity: Not on file  Other Topics Concern  . Not on file  Social History Narrative  . Not on file    Allergies  Allergen Reactions  . Metoclopramide Hcl Hypertension    Reglan=Increases HBP symptoms    Family History  Problem Relation Age of Onset  . Heart attack Father   . Hyperlipidemia Father   . Hypertension Father   . Heart disease Father        Heart Disease before age 41- PVD  . Diabetes Brother   . Heart disease Brother        Heart Disese before age 22  . Hyperlipidemia Brother   . Hypertension Brother   . Leukemia Brother   . Heart disease Mother        Heart Disease before age 74  . Hyperlipidemia Mother   . Hypertension Mother   . Heart attack Mother   . Coronary artery disease Other   . Colon cancer Neg Hx   . Esophageal cancer Neg Hx   . Pancreatic cancer Neg Hx   . Stomach cancer Neg Hx   . Liver disease Neg Hx   . Rectal cancer Neg Hx     Prior to Admission medications    Medication Sig Start Date End Date Taking? Authorizing Provider  amLODipine (NORVASC) 5 MG tablet TAKE 1 TABLET BY MOUTH TWICE A DAY Patient taking differently: Take 5 mg by mouth daily.  08/02/15  Yes Nahser, Wonda Cheng, MD  aspirin EC 81 MG tablet Take 81 mg by mouth daily.   Yes [provider]  Cyanocobalamin (B-12) 2500 MCG TABS Take 2,500 mcg by mouth daily.    Yes [provider]  famotidine (PEPCID) 40 MG tablet Take 40 mg by mouth 2 (two) times daily.   Yes [provider]  fenofibrate 160 MG tablet Take 1 tablet (160 mg total) by mouth daily. 12/04/11  Yes Larey Dresser, MD  FERREX 150 150 MG capsule Take 150 mg by mouth daily.  07/20/19  Yes [provider]  folic acid (FOLVITE) 1 MG tablet Take 1 mg by mouth daily.     Yes [provider]  furosemide (LASIX) 40 MG tablet Take 40 mg by mouth daily.  03/09/11  Yes [provider]  labetalol (NORMODYNE) 200 MG tablet TAKE 1 TABLET BY MOUTH TWICE A DAY Patient taking differently: Take 200 mg by mouth 3 (three) times daily.  08/02/15  Yes Larey Dresser, MD  losartan (COZAAR) 100 MG tablet Take 100 mg by mouth daily.  04/28/11  Yes [provider]  metFORMIN (GLUCOPHAGE) 1000 MG tablet Take 1,000 mg by mouth 2 (two) times daily with a meal.  02/09/11  Yes [provider]  Multiple Vitamin (MULTIVITAMIN) capsule Take 1 capsule by mouth daily.     Yes [provider]  rosuvastatin (CRESTOR) 20 MG tablet Take 20 mg by mouth daily. 02/11/16  Yes [provider]    Physical Exam: Vitals:   10/09/19 1645 10/09/19 1700 10/09/19 1715 10/09/19 1730  BP: (!) 158/98 (!) 161/88 (!) 134/117 (!) 161/90  Pulse: (!) 105 (!) 105 (!) 104 (!) 105  Resp: (!) 29   (!) 29  Temp:      SpO2: 98% 93% 91% 92%     . General:  Appears ill but in NAD . Eyes:  PERRL, EOMI, normal lids, iris .  ENT:  grossly normal hearing, lips & tongue, mmm; appropriate dentition . Neck:  no  LAD, masses or thyromegaly . Cardiovascular:  RR with mild tachycardia, no m/r/g. No LE edema.  Marland Kitchen Respiratory:   Diffuse rhonchi.  Mildly increased respiratory effort despite 4L O2, with sats currently consistently 88-89%. . Abdomen:  soft, NT, ND, NABS . Skin:  no rash or induration seen on limited exam . Musculoskeletal:  grossly normal tone BUE/BLE, good ROM, no bony abnormality . Psychiatric:  blunted mood and affect, speech fluent and appropriate, AOx3 . Neurologic:  CN 2-12 grossly intact, moves all extremities in coordinated fashion, sensation intact    Radiological Exams on Admission: Dg Chest 2 View  Result Date: 10/09/2019 CLINICAL DATA:  Pt arrives via [facility] from his pcp office, pt reports cough since tuesday, O2 sat was 88% on room air, placed on 4L O2 via n.c. and O2 sat improved to 93%. bp 130/72, HR 90 NSR, CBG 153, Temp 99.9. pt denies any cp, sob, body aches or chills. EXAM: CHEST - 2 VIEW COMPARISON:  05/21/2011 FINDINGS: New interstitial and patchy alveolar opacities in the right mid and lower lung. Mild interstitial prominence at the left lung base. Heart size and mediastinal contours are within normal limits. Previous median sternotomy and CABG. No effusion. No pneumothorax. Visualized bones unremarkable. IMPRESSION: New interstitial and patchy alveolar opacities predominantly in the right mid and lower lung suspicious for multifocal or viral pneumonia. Electronically Signed   By: Lucrezia Europe M.D.   On: 10/09/2019 14:35    EKG: Independently reviewed.  NSR with rate 100; occasional PVC; nonspecific ST changes with no evidence of acute ischemia; NSCSLT   Labs on Admission: I have personally reviewed the available labs and imaging studies at the time of the admission.  Pertinent labs:   Na++ 132 CO2 18 Glucose 152 BUN 24/Creatinine 1.97/GFR 33 Albumin 3.1 AST 55/ALT 21 WBC 8.7 Hgb 11.7 COVID POSITIVE  Assessment/Plan Principal Problem:   Acute respiratory disease  due to COVID-19 virus Active Problems:   Hypertension   Coronary artery disease   Hyperlipidemia   OSA (obstructive sleep apnea)   Carotid stenosis, bilateral   Diabetes mellitus type 2 in obese (HCC)   Acute respiratory failure with hypoxia from COVID-19 infection -Patient with presenting with SOB and cough -Also with chest tightness while in the ER; likely associated with COVID, as unchanged EKG but will check HS troponin.  No STEMI and he would not likely need acute intervention so transfer to [facility] is most appropriate at this time. -He does not have a usual home O2 requirement and is currently requiring 8L [place] O2 with O2 sats in 87-88% -COVID POSITIVE -The patient has comorbidities which may increase the risk for ARDS/MODS including: age, HTN, DM, obesity -Exam is concerning for development of ARDS/MODS due to respiratory distress, renal insufficiency -Pertinent labs concerning for COVID include normal WBC count; increased BUN/Creatinine; other COVID labs are pending -CXR with multifocal opacities which may be c/w COVID vs. Multifocal PNA -Will treat with broad-spectrum antibiotics empirically for now pending procalcitonin -Will admit to [facility] progressive unit for further evaluation, close monitoring, and treatment -Monitor on telemetry x at least 24 hours -At this time, will attempt to avoid use of aerosolized medications and use HFAs instead -Will check daily labs including BMP with Mag, Phos; LFTs; CBC with differential; CRP; ferritin; fibrinogen; D-dimer -Consider Echo (particularly if troponin is significantly elevated)  -Will order steroids (1 mg/kg divided BID) and Remdesivir (pharmacy  consult) given +COVID test, +CXR, and hypoxia <94% on room air -If the patient shows clinical deterioration, consider transfer to ICU with PCCM consultation -If the patient is hypoxic or on >3L oxygen from baseline or CRP >7, considerTocilizumab 8 mg/kg x1 IF + COVID test; O2 sats <88% on room air; and  patient is at high risk for intubation.  Will defer to [facility] doctors regarding this medication since it is being used off label. -Will attempt to maintain euvolemia to a net negative fluid status -Will ask the patient to maintain an awake prone position for 16+ hours a day, if possible, with a minimum of 2-3 hours at a time -Patient was seen wearing full PPE including: gown, gloves, head cover, N95, and face shield; donning and doffing was in compliance with current standards.  DM, Uncontrolled -Will check A1c = 7.2 -hold Glucophage -Cover with moderate-scale SSI  HTN -Continue Norvasc, Labetalol -Hold Cozaar - his last available creatinine was in 2015 and was 1.3 and so may have AKI vs. CKD   HLD -Continue Crestor, fenofibrate  CAD/carotid stenosis -Continue ASA  OSA -Cannot use CPAP with COVID due to aerosolization of particles   DVT prophylaxis:  Lovenox  Code Status:  Full - confirmed with patient Family Communication: None present; the patient did not request that I speak with his wife.  However, I did attempt to call and did not receive an answer. Disposition Plan:  Home once clinically improved Consults called: None  Admission status: Admit - It is my clinical opinion that admission to INPATIENT is reasonable and necessary because of the expectation that this patient will require hospital care that crosses at least 2 midnights to treat this condition based on the medical complexity of the problems presented.  Given the aforementioned information, the predictability of an adverse outcome is felt to be significant.     Karmen Bongo MD [facility]   How to contact the [facility] Attending or Consulting provider [place] or covering provider during after hours [place], for this patient?  1. Check the care team in [facility] and look for a) attending/consulting [facility] provider listed and b) the [facility] team listed 2. Log into [email or web address] and use [place]'s universal password to access.  If you do not have the password, please contact the hospital operator. 3. Locate the [facility] provider you are looking for under [facility] and page to a number that you can be directly reached. 4. If you still have difficulty reaching the provider, please page the [facility] (Director on Call) for the Hospitalists listed on amion for assistance.   10/09/2019, 6:25 PM

## 2019-10-09 NOTE — Consult Note (Signed)
Cardiology Consultation:   Patient ID: [name]; [id number]; [birth year]   Admit date: 10/09/2019 Date of Consult: 10/09/2019  Primary Care Provider: Marton Redwood, MD Primary Cardiologist: No primary care provider on file. Primary Electrophysiologist:  None  Chief Complaint: dyspnea  Patient Profile:   Matthew Bates is a 73 y.o. male with a hx of CABG in 03/2009, PCI to RCA and LCx in 10/2009, carotid artery disease s/p L CEA 2012, DM, HTN who presents with dyspnea.  History of Present Illness:   Started having cough and congestion several days ago.  Then noticed onset of shortness of breath.  Was evaluated at a local medical facility and found to have oxygen saturation in the 80s.  Was sent to the ED here at [facility].  Hypoxic and requiring 4 L nasal cannula.  Chest x-ray with multifocal pneumonia.  COVID 19 test came back positive.    Labs otherwise notable for creatinine of 2 (baseline 1.3 in 2015), BNP 1496, troponin 2549.  Cardiology consulted for NSTEMI.  No chest pain.  EKG with sinus rhythm, mild ST depression and two PVCs.  Regarding coronary disease history, underwent CABG and 03/2009.  In December 2010 had repeat cath due to weakness/fatigue, and found to have occlusion of the SVG to PDA.  Underwent PCI to distal RCA and proximal circumflex.  Most recent echo 2012 with mildly reduced LVEF.  Past Medical History:  Diagnosis Date  . Anemia   . CAD (coronary artery disease)   . Carotid artery occlusion   . Cataract   . Chronic kidney disease   . DM (diabetes mellitus) ([place])   . GERD (gastroesophageal reflux disease)   . Gout   . Hepatitis    as a child  . High triglycerides   . HTN (hypertension)   . Obesity     Past Surgical History:  Procedure Laterality Date  . ANGIOPLASTY     --cardiac  . APPENDECTOMY    . CAROTID ENDARTERECTOMY  05/22/11   Left CEA  . CATARACT EXTRACTION Bilateral   . COLONOSCOPY  10-09-13  . KNEE SURGERY Right   . PR VEIN  BYPASS GRAFT,AORTO-FEM-POP  2010  . spleen surgery       Inpatient Medications: Scheduled Meds: . [START ON 10/10/2019] aspirin EC  81 mg Oral Daily  . [START ON 10/10/2019] dexamethasone (DECADRON) injection  6 mg Intravenous Q24H  . docusate sodium  100 mg Oral BID  . famotidine  40 mg Oral BID  . [START ON 10/10/2019] fenofibrate  160 mg Oral Daily  . furosemide  40 mg Intravenous Once  . [START ON 10/10/2019] insulin aspart  0-15 Units Subcutaneous TID WC  . insulin aspart  0-5 Units Subcutaneous QHS  . [START ON 10/10/2019] rosuvastatin  20 mg Oral Daily  . sodium chloride flush  3 mL Intravenous Q12H  . sodium chloride flush  3 mL Intravenous Q12H   Continuous Infusions: . sodium chloride    . azithromycin Stopped (10/09/19 2108)  . cefTRIAXone (ROCEPHIN)  IV Stopped (10/09/19 2001)  . heparin 1,000 Units/hr (10/09/19 2007)  . [START ON 10/10/2019] remdesivir 100 mg in NS 100 mL     PRN Meds: sodium chloride, acetaminophen, albuterol, bisacodyl, ondansetron **OR** ondansetron (ZOFRAN) IV, oxyCODONE, polyethylene glycol, sodium chloride flush, zolpidem  Home Meds: Prior to Admission medications   Medication Sig Start Date End Date Taking? Authorizing Provider  amLODipine (NORVASC) 5 MG tablet TAKE 1 TABLET BY MOUTH TWICE A DAY Patient  taking differently: Take 5 mg by mouth daily.  08/02/15  Yes Nahser, Wonda Cheng, MD  aspirin EC 81 MG tablet Take 81 mg by mouth daily.   Yes [provider]  Cyanocobalamin (B-12) 2500 MCG TABS Take 2,500 mcg by mouth daily.    Yes [provider]  famotidine (PEPCID) 40 MG tablet Take 40 mg by mouth 2 (two) times daily.   Yes [provider]  fenofibrate 160 MG tablet Take 1 tablet (160 mg total) by mouth daily. 12/04/11  Yes Larey Dresser, MD  FERREX 150 150 MG capsule Take 150 mg by mouth daily.  07/20/19  Yes [provider]  folic acid (FOLVITE) 1 MG tablet Take 1 mg by mouth daily.     Yes [provider]  furosemide (LASIX) 40 MG tablet Take 40 mg by mouth daily.  03/09/11  Yes [provider]  labetalol (NORMODYNE) 200 MG tablet TAKE 1 TABLET BY MOUTH TWICE A DAY Patient taking differently: Take 200 mg by mouth 3 (three) times daily.  08/02/15  Yes Larey Dresser, MD  losartan (COZAAR) 100 MG tablet Take 100 mg by mouth daily.  04/28/11  Yes [provider]  metFORMIN (GLUCOPHAGE) 1000 MG tablet Take 1,000 mg by mouth 2 (two) times daily with a meal.  02/09/11  Yes [provider]  Multiple Vitamin (MULTIVITAMIN) capsule Take 1 capsule by mouth daily.     Yes [provider]  rosuvastatin (CRESTOR) 20 MG tablet Take 20 mg by mouth daily. 02/11/16  Yes [provider]    Allergies:    Allergies  Allergen Reactions  . Metoclopramide Hcl Hypertension    Reglan=Increases HBP symptoms    Social History:   Social History   Socioeconomic History  . Marital status: Married    Spouse name: Not on file  . Number of children: Not on file  . Years of education: Not on file  . Highest education level: Not on file  Occupational History  . Not on file  Social Needs  . Financial resource strain: Not on file  . Food insecurity    Worry: Not on file    Inability: Not on file  . Transportation needs    Medical: Not on file    Non-medical: Not on file  Tobacco Use  . Smoking status: Former Smoker    Types: Cigarettes    Quit date: 11/04/1970    Years since quitting: 48.9  . Smokeless tobacco: Never Used  Substance and Sexual Activity  . Alcohol use: Yes    Comment: rare  . Drug use: No  . Sexual activity: Not on file  Lifestyle  . Physical activity    Days per week: Not on file    Minutes per session: Not on file  . Stress: Not on file  Relationships  . Social Herbalist on phone: Not on file    Gets together: Not on file    Attends religious service: Not on file    Active member of club or organization: Not on file     Attends meetings of clubs or organizations: Not on file    Relationship status: Not on file  . Intimate partner violence    Fear of current or ex partner: Not on file    Emotionally abused: Not on file    Physically abused: Not on file    Forced sexual activity: Not on file  Other Topics Concern  .  Not on file  Social History Narrative  . Not on file     Family History:    Family History  Problem Relation Age of Onset  . Heart attack Father   . Hyperlipidemia Father   . Hypertension Father   . Heart disease Father        Heart Disease before age 49- PVD  . Diabetes Brother   . Heart disease Brother        Heart Disese before age 88  . Hyperlipidemia Brother   . Hypertension Brother   . Leukemia Brother   . Heart disease Mother        Heart Disease before age 17  . Hyperlipidemia Mother   . Hypertension Mother   . Heart attack Mother   . Coronary artery disease Other   . Colon cancer Neg Hx   . Esophageal cancer Neg Hx   . Pancreatic cancer Neg Hx   . Stomach cancer Neg Hx   . Liver disease Neg Hx   . Rectal cancer Neg Hx       ROS:  Please see the history of present illness.  All other ROS reviewed and negative.     Physical Exam/Data:   Vitals:   10/09/19 2019 10/09/19 2045 10/09/19 2100 10/09/19 2115  BP: 106/82 99/66 104/65 100/64  Pulse: 75 71 70 70  Resp: 20 (!) 26 (!) 26 (!) 23  Temp:      SpO2: 98% 97% 98% 96%  Weight:      Height:        Intake/Output Summary (Last 24 hours) at 10/09/2019 2128 Last data filed at 10/09/2019 2108 Gross per 24 hour  Intake 250 ml  Output -  Net 250 ml   Last 3 Weights 10/09/2019 09/21/2019 08/17/2019  Weight (lbs) 189 lb 189 lb 189 lb 12.8 oz  Weight (kg) 85.73 kg 85.73 kg 86.093 kg   EKG:  The EKG was personally reviewed and demonstrates:  Sinus rhythm, mild ST depressions anterolateral Telemetry:  Telemetry was personally reviewed and demonstrates:  Sinus rhythm  Relevant CV Studies: See HPI   Laboratory Data:  High Sensitivity Troponin:   Recent Labs  Lab 10/09/19 1741  TROPONINIHS 2,549*     Cardiac EnzymesNo results for input(s): TROPONINI in the last 168 hours. No results for input(s): TROPIPOC in the last 168 hours.  Chemistry Recent Labs  Lab 10/09/19 1407 10/09/19 2007  NA 132* 132*  K 3.8 3.9  CL 102  --   CO2 18*  --   GLUCOSE 152*  --   BUN 24*  --   CREATININE 1.97*  --   CALCIUM 9.3  --   GFRNONAA 33*  --   GFRAA 38*  --   ANIONGAP 12  --     Recent Labs  Lab 10/09/19 1407  PROT 7.2  ALBUMIN 3.1*  AST 55*  ALT 21  ALKPHOS 49  BILITOT 0.9   Hematology Recent Labs  Lab 10/09/19 1407 10/09/19 2007  WBC 8.7  --   RBC 4.65  --   HGB 11.7* 11.6*  HCT 37.6* 34.0*  MCV 80.9  --   MCH 25.2*  --   MCHC 31.1  --   RDW 16.4*  --   PLT 243  --    BNP Recent Labs  Lab 10/09/19 1741  BNP 1,496.3*    DDimer  Recent Labs  Lab 10/09/19 1741  DDIMER 3.94*     Radiology/Studies:  Dg Chest  2 View  Result Date: 10/09/2019 CLINICAL DATA:  Pt arrives via [facility]ms from his pcp office, pt reports cough since tuesday, O2 sat was 88% on room air, placed on 4L O2 via n.c. and O2 sat improved to 93%. bp 130/72, HR 90 NSR, CBG 153, Temp 99.9. pt denies any cp, sob, body aches or chills. EXAM: CHEST - 2 VIEW COMPARISON:  05/21/2011 FINDINGS: New interstitial and patchy alveolar opacities in the right mid and lower lung. Mild interstitial prominence at the left lung base. Heart size and mediastinal contours are within normal limits. Previous median sternotomy and CABG. No effusion. No pneumothorax. Visualized bones unremarkable. IMPRESSION: New interstitial and patchy alveolar opacities predominantly in the right mid and lower lung suspicious for multifocal or viral pneumonia. Electronically Signed   By: Lucrezia Europe M.D.   On: 10/09/2019 14:35    Assessment and Plan:   1. NSTEMI Significantly elevated troponin in the absence of chest pain, in the setting  of Covid infection with severe hypoxemia.  Could be acute MI or myocarditis.  Agree with IV heparin for anticoagulation.  Will eventually need to get echo, but will need to weigh the risks of the staff exposure.  Will also need to determine whether coronary angiography is indicated prior to discharge, based on his clinical course and echo results. -- IV heparin for NSTEMI -- will need echo eventually, will discuss with staff in AM given COVID infection -- will determine whether LHC is indicated prior to discharged -- continue statin -- can consider BB if BP improves (currently 97/69)      For questions or updates, please contact [place] Please consult [email or web address] for contact info under     Signed, Chriss Czar, MD  10/09/2019 9:28 PM

## 2019-10-10 ENCOUNTER — Inpatient Hospital Stay (HOSPITAL_COMMUNITY): Payer: Medicare HMO

## 2019-10-10 DIAGNOSIS — U071 COVID-19: Secondary | ICD-10-CM | POA: Diagnosis present

## 2019-10-10 DIAGNOSIS — I340 Nonrheumatic mitral (valve) insufficiency: Secondary | ICD-10-CM

## 2019-10-10 DIAGNOSIS — I371 Nonrheumatic pulmonary valve insufficiency: Secondary | ICD-10-CM | POA: Diagnosis not present

## 2019-10-10 DIAGNOSIS — R05 Cough: Secondary | ICD-10-CM

## 2019-10-10 LAB — ECHOCARDIOGRAM LIMITED
Height: 69 in
Weight: 3024 [oz_av]

## 2019-10-10 LAB — COMPREHENSIVE METABOLIC PANEL
ALT: 26 U/L (ref 0–44)
AST: 70 U/L — ABNORMAL HIGH (ref 15–41)
Albumin: 2.4 g/dL — ABNORMAL LOW (ref 3.5–5.0)
Alkaline Phosphatase: 38 U/L (ref 38–126)
Anion gap: 11 (ref 5–15)
BUN: 32 mg/dL — ABNORMAL HIGH (ref 8–23)
CO2: 19 mmol/L — ABNORMAL LOW (ref 22–32)
Calcium: 8.4 mg/dL — ABNORMAL LOW (ref 8.9–10.3)
Chloride: 106 mmol/L (ref 98–111)
Creatinine, Ser: 1.99 mg/dL — ABNORMAL HIGH (ref 0.61–1.24)
GFR calc Af Amer: 37 mL/min — ABNORMAL LOW (ref >60)
GFR calc non Af Amer: 32 mL/min — ABNORMAL LOW (ref >60)
Glucose, Bld: 147 mg/dL — ABNORMAL HIGH (ref 70–99)
Potassium: 3.7 mmol/L (ref 3.5–5.1)
Sodium: 136 mmol/L (ref 135–145)
Total Bilirubin: 0.4 mg/dL (ref 0.3–1.2)
Total Protein: 6 g/dL — ABNORMAL LOW (ref 6.5–8.1)

## 2019-10-10 LAB — CBC WITH DIFFERENTIAL/PLATELET
Abs Immature Granulocytes: 0.05 10*3/uL (ref 0.00–0.07)
Basophils Absolute: 0 10*3/uL (ref 0.0–0.1)
Basophils Relative: 0 %
Eosinophils Absolute: 0 10*3/uL (ref 0.0–0.5)
Eosinophils Relative: 0 %
HCT: 35 % — ABNORMAL LOW (ref 39.0–52.0)
Hemoglobin: 10.9 g/dL — ABNORMAL LOW (ref 13.0–17.0)
Immature Granulocytes: 1 %
Lymphocytes Relative: 13 %
Lymphs Abs: 1.2 10*3/uL (ref 0.7–4.0)
MCH: 24.8 pg — ABNORMAL LOW (ref 26.0–34.0)
MCHC: 31.1 g/dL (ref 30.0–36.0)
MCV: 79.7 fL — ABNORMAL LOW (ref 80.0–100.0)
Monocytes Absolute: 0.5 10*3/uL (ref 0.1–1.0)
Monocytes Relative: 6 %
Neutro Abs: 7.3 10*3/uL (ref 1.7–7.7)
Neutrophils Relative %: 80 %
Platelets: 246 10*3/uL (ref 150–400)
RBC: 4.39 MIL/uL (ref 4.22–5.81)
RDW: 16.4 % — ABNORMAL HIGH (ref 11.5–15.5)
WBC: 9 10*3/uL (ref 4.0–10.5)
nRBC: 0 % (ref 0.0–0.2)

## 2019-10-10 LAB — C-REACTIVE PROTEIN: CRP: 21.3 mg/dL — ABNORMAL HIGH (ref <1.0)

## 2019-10-10 LAB — CBG MONITORING, ED
Glucose-Capillary: 139 mg/dL — ABNORMAL HIGH (ref 70–99)
Glucose-Capillary: 179 mg/dL — ABNORMAL HIGH (ref 70–99)
Glucose-Capillary: 226 mg/dL — ABNORMAL HIGH (ref 70–99)
Glucose-Capillary: 175 mg/dL — ABNORMAL HIGH (ref 70–99)

## 2019-10-10 LAB — HEPARIN LEVEL (UNFRACTIONATED): Heparin Unfractionated: 0.45 [IU]/mL (ref 0.30–0.70)

## 2019-10-10 LAB — FERRITIN: Ferritin: 81 ng/mL (ref 24–336)

## 2019-10-10 LAB — D-DIMER, QUANTITATIVE: D-Dimer, Quant: 2.1 ug/mL-FEU — ABNORMAL HIGH (ref 0.00–0.50)

## 2019-10-10 LAB — MAGNESIUM: Magnesium: 1.7 mg/dL (ref 1.7–2.4)

## 2019-10-10 LAB — TROPONIN I (HIGH SENSITIVITY)
Troponin I (High Sensitivity): 2955 ng/L (ref <18)
Troponin I (High Sensitivity): 2825 ng/L (ref <18)
Troponin I (High Sensitivity): 2115 ng/L (ref <18)

## 2019-10-10 LAB — PHOSPHORUS: Phosphorus: 4.2 mg/dL (ref 2.5–4.6)

## 2019-10-10 MED ORDER — SODIUM BICARBONATE 650 MG PO TABS
1300.0000 mg | ORAL_TABLET | Freq: Two times a day (BID) | ORAL | Status: AC
  Administered 2019-10-10 – 2019-10-11 (×4): 1300 mg via ORAL
  Filled 2019-10-10 (×5): qty 2

## 2019-10-10 MED ORDER — VITAMIN C 500 MG/5ML PO SYRP
500.0000 mg | ORAL_SOLUTION | Freq: Every day | ORAL | Status: DC
  Filled 2019-10-10 (×3): qty 5

## 2019-10-10 MED ORDER — ZINC SULFATE 220 (50 ZN) MG PO CAPS
220.0000 mg | ORAL_CAPSULE | Freq: Every day | ORAL | Status: DC
  Administered 2019-10-10 – 2019-10-23 (×14): 220 mg via ORAL
  Filled 2019-10-10 (×13): qty 1

## 2019-10-10 MED ORDER — VITAMIN D 25 MCG (1000 UNIT) PO TABS
1000.0000 [IU] | ORAL_TABLET | Freq: Every day | ORAL | Status: DC
  Administered 2019-10-10 – 2019-10-23 (×13): 1000 [IU] via ORAL
  Filled 2019-10-10 (×13): qty 1

## 2019-10-10 MED ORDER — MAGNESIUM SULFATE 2 GM/50ML IV SOLN
2.0000 g | Freq: Once | INTRAVENOUS | Status: AC
  Administered 2019-10-10: 2 g via INTRAVENOUS
  Filled 2019-10-10: qty 50

## 2019-10-10 MED ORDER — NITROGLYCERIN 0.4 MG SL SUBL: 0.4000 mg | SUBLINGUAL_TABLET | SUBLINGUAL | Status: DC | PRN

## 2019-10-10 MED ORDER — LEVALBUTEROL TARTRATE 45 MCG/ACT IN AERO
2.0000 | INHALATION_SPRAY | Freq: Three times a day (TID) | RESPIRATORY_TRACT | Status: DC
  Administered 2019-10-11 – 2019-10-13 (×8): 2 via RESPIRATORY_TRACT
  Filled 2019-10-10 (×2): qty 15

## 2019-10-10 MED ORDER — IPRATROPIUM BROMIDE HFA 17 MCG/ACT IN AERS
2.0000 | INHALATION_SPRAY | Freq: Three times a day (TID) | RESPIRATORY_TRACT | Status: DC
  Administered 2019-10-10 – 2019-10-13 (×10): 2 via RESPIRATORY_TRACT
  Filled 2019-10-10 (×2): qty 12.9

## 2019-10-10 NOTE — Progress Notes (Signed)
  Echocardiogram 2D Echocardiogram has been performed.  Johny Chess 10/10/2019, 11:59 AM

## 2019-10-10 NOTE — ED Notes (Signed)
Report attempted, [place] unable to take at this time.

## 2019-10-10 NOTE — ED Notes (Signed)
Waiting for some PO meds from pharmacy.

## 2019-10-10 NOTE — ED Notes (Signed)
PT trialing a nasal cannula @ 4L. States "I have not tried a nasal cannula since I have been here." His breakfast tray is at the bedside however the patient states the Dr told him he could not eat until after his echo. Pt has an active order for a diet, so I messaged Dr Nevada Crane. Waiting to hear back from her prior to eating.

## 2019-10-10 NOTE — Progress Notes (Signed)
The patient was no longer having any chest pain this morning when evaluated by hospitalist and cardiologist, and was cleared for transport to [facility] for admission there.   Per RN, he began to complain of chest pain again 1-2 hours ago.    AC at [facility] is understandably concerned about receiving this patient now. The on-call cardiologist was notified and deferred decision to transfer per nursing note. With new/recurrent chest pain, I recommended getting an EKG now, treating the chest pain with NTG, and keeping him at [facility] for now.

## 2019-10-10 NOTE — ED Notes (Signed)
Ordered bfast 

## 2019-10-10 NOTE — ED Notes (Signed)
Patient questionable for appropriate assignment at [facility] d/t cardiac history and chest tightness. Charge RN notified, on call attending notified, [place] notified, bed placement RN notified.

## 2019-10-10 NOTE — ED Notes (Signed)
Called by [facility] in regards to pt's appropriateness to transfer to [facility] due to elevated troponins and pt c/o chest tightness onset 1 hr ago. Spoke with on-call admitting physician who advised to call cardiology. Per cardiology, pt is being medically managed and decision to continue with transfer ultimately up to admitting. Spoke with [facility], who then advised pt not appropriate for [facility]. Zelphia Cairo, RN attempting to reach admitting doc again for bed request change.

## 2019-10-10 NOTE — ED Notes (Signed)
Report attempted, [facility] unable to take at this time.

## 2019-10-10 NOTE — ED Notes (Signed)
Pt is a difficult stick. Phlebotomy to try labs again in the AM.

## 2019-10-10 NOTE — Progress Notes (Signed)
Primary team is hoping to transfer patient who is COVID 19 positive to [facility]. Assessed by fellow cardiologist overnight.  Patient presented with several days of cough and congestion and was found to be COVID-19 positive.  Hypoxic on 4 L nasal cannula.  Due to COVID-19 appropriate social distancing, discussions with primary service and chart review performed.  Patient has a BNP of 1496 and troponin of 2549, concern raised for NSTEMI.  The patient is not having any chest pain per her report.  The patient has known coronary artery disease with CABG in May 2010 he is also had PCI after CABG for occlusion of the SVG to PDA and he underwent PCI to the distal RCA and proximal circumflex.  Recommendations: We discussed with the primary team that we will obtain a very limited echocardiogram for LV and RV function.  His last echocardiogram in 2012 demonstrated an ejection fraction of 45%.  If biventricular function is relatively stable as compared to prior, no further cardiovascular testing will be required at this time.  Close monitoring of hemodynamic status is advisable.  Cardiology would be happy to support the physicians at [facility] as needed throughout the patient's care.  If possible from a hemodynamic standpoint, cardiovascular medication should be continued.

## 2019-10-10 NOTE — ED Notes (Addendum)
Patient had a [facility] bed assigned, then returned to Rm pending status. [place] RN previously taking report [phone]

## 2019-10-10 NOTE — ED Notes (Signed)
Called pharmacist about IV Remdesivir infusion. They state it is in route.

## 2019-10-10 NOTE — Progress Notes (Signed)
[place] for heparin Indication: chest pain/ACS  Allergies  Allergen Reactions  . Metoclopramide Hcl Hypertension    Reglan=Increases HBP symptoms    Patient Measurements: Height: 5\' 9"  (175.3 cm) Weight: 189 lb (85.7 kg) IBW/kg (Calculated) : 70.7 Heparin Dosing Weight: 85.7kg  Vital Signs: BP: 139/67 (12/06 0900) Pulse Rate: 75 (12/06 0900)  Labs: Recent Labs    10/09/19 1407 10/09/19 1741 10/09/19 2007 10/10/19 0910  HGB 11.7*  --  11.6* 10.9*  HCT 37.6*  --  34.0* 35.0*  PLT 243  --   --  246  HEPARINUNFRC  --   --   --  0.45  CREATININE 1.97*  --   --  1.99*  TROPONINIHS  --  2,549*  --  2,955*    Estimated Creatinine Clearance: 35.3 mL/min (A) (by C-G formula based on SCr of 1.99 mg/dL (H)).  Medications:  Infusions:  . sodium chloride    . azithromycin Stopped (10/09/19 2108)  . cefTRIAXone (ROCEPHIN)  IV Stopped (10/09/19 2001)  . heparin 1,000 Units/hr (10/09/19 2007)  . remdesivir 100 mg in NS 100 mL 100 mg (10/10/19 1042)    Assessment: 12 yom presented to the ED with cough and SOB. Found to have [place]. Also with elevated troponin and started on IV heparin. Initial heparin level is therapeutic at 0.45. CBC is stable and no bleeding noted.   Goal of Therapy:  Heparin level 0.3-0.7 units/ml Monitor platelets by anticoagulation protocol: Yes   Plan:  Continue Heparin gtt 1000 units/hr Daily heparin level and CBC  Chamya Hunton, Rande Lawman 10/10/2019,11:08 AM

## 2019-10-10 NOTE — Progress Notes (Signed)
PROGRESS NOTE  Matthew Bates [id number] DOB: 03/27/1946 DOA: 10/09/2019 PCP: Marton Redwood, MD  HPI/Recap of past 69 hours: 73 year old male with past medical history significant for chronic systolic CHF, coronary artery disease, hypertension, hyperlipidemia, type 2 diabetes, carotid stenosis, obesity, CKD who presented to the ED with complaints of shortness of breath and fatigue of 2 to 3 days duration.  Have positive sick contact with COVID-19 at Thanksgiving.  In the ER he has noticed a persistent chest tightness not exertional and not relieved with rest.  COVID-19 positive on 10/09/2019.  Significantly hypoxic now on 4 L by nasal cannula.  Chest x-ray showing bilateral patchy pulmonary infiltrates right greater than left.  Started on remdesivir and Decadron.  Significantly elevated troponin S greater than 2500.  2D echo ordered and completed.  No significant changes in his EF.  Seen by cardiology and okay to transfer to [facility] to continue care.  10/10/19: Seen and examined in the ED.  Denies chest pain at the time of this visit.  States his shortness of breath has improved.  Admits to persistent dry cough.  Assessment/Plan: Principal Problem:   Acute respiratory disease due to COVID-19 virus Active Problems:   Hypertension   Coronary artery disease   Hyperlipidemia   OSA (obstructive sleep apnea)   Carotid stenosis, bilateral   Diabetes mellitus type 2 in obese (HCC)   Acute hypoxemic respiratory failure due to COVID-19 [facility])  Acute COVID-19 viral pneumonia with concern for superimposed bacterial community-acquired pneumonia Initially on nonrebreather, now on 4 L with remdesivir and Decadron COVID-19 positive on 10/09/19 Procalcitonin 0.76>> on Rocephin and azithromycin Personally reviewed chest x-ray admission which showed bilateral patchy pulmonary infiltrates right greater than left. Maintain O2 saturation greater than 94% Continue remdesivir day number 2 out of 5 Continue IV  Decadron 6 mg daily x10 days total Start Xopenex inhaler and ipratropium inhaler 3 times daily Start incentive spirometer Start oral supplement vitamin C, D3 and zinc Trend inflammatory markers Obtain procalcitonin in the morning  Acute hypoxic respiratory failure secondary to acute COVID-19 viral pneumonia Management as stated above  Elevated troponin likely secondary to demand ischemia from severe hypoxia Troponin S peaked at greater than 2900, trending up. Repeat troponin S. Personally reviewed EKG which showed sinus rhythm with occasional PVCs rate of 100.  No specific ST-T changes. 2D echo completed showing LVEF 45 to 50% Seen by cardiology Recommend continue medical management Denies anginal symptoms at the time of this evaluation Continue to monitor on telemetry  Chronic systolic CHF 2D echo LVEF 45 to 50% Cardiology has been consulted Management per cardiology  Type 2 diabetes, well controlled A1c 7.2 on 10/09/2019. 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 Continue daily BMPs  Hypomagnesemia Magnesium 1.7 on 10/10/2019 Replete with IV magnesium 2 g once.  Essential hypertension Resume home labetalol  Defer diuretics to cardiology in the setting of AKI Hold losartan due to AKI  Coronary artery disease Continue aspirin, Crestor and fenofibrate  OSA Cannot use CPAP due to concern for aerosolization of particles. Can use high flow nasal cannula or  nonrebreather as needed  DVT prophylaxis:  Lovenox subcu daily. Code  Status:  Full - confirmed with patient Family Communication:  None at bedside.     Disposition Plan:   Transfer to [facility] to continue care.  Consults called:  Cardiology.      Objective: Vitals:   10/10/19 0900 10/10/19 1000 10/10/19 1100 10/10/19 1300  BP: 139/67 139/62 138/63 (!) 155/81  Pulse: 75 74 73 92  Resp: (!) 24 18 17 19   Temp:      SpO2: 97% 93% 95% 95%  Weight:      Height:        Intake/Output Summary (Last 24 hours) at 10/10/2019 1320 Last data filed at 10/10/2019 1122 Gross per 24 hour  Intake 350 ml  Output 500 ml  Net -150 ml   Filed Weights   10/09/19 1900  Weight: 85.7 kg    Exam:  . General: 73 y.o. year-old male well developed well nourished in no acute distress.  Alert and oriented x3. . Cardiovascular: Regular rate and rhythm with no rubs or gallops.  No thyromegaly or JVD noted.   Marland Kitchen Respiratory: Mild rales at bases.  No wheezing noted.  Good respiratory effort.   . Abdomen: Obese nontender with normal bowel sounds x4 quadrants. . Musculoskeletal: Trace lower extremity edema. 2/4 pulses in all 4 extremities. Marland Kitchen Psychiatry: Mood is appropriate for condition and setting   Data Reviewed: CBC: Recent Labs  Lab 10/09/19 1407 10/09/19 2007 10/10/19 0910  WBC 8.7  --  9.0  NEUTROABS 7.1  --  7.3  HGB 11.7* 11.6* 10.9*  HCT 37.6* 34.0* 35.0*  MCV 80.9  --  79.7*  PLT 243  --  0000000   Basic Metabolic Panel: Recent Labs  Lab 10/09/19 1407 10/09/19 2007 10/10/19 0910  NA 132* 132* 136  K 3.8 3.9 3.7  CL 102  --  106  CO2 18*  --  19*  GLUCOSE 152*  --  147*  BUN 24*  --  32*  CREATININE 1.97*  --  1.99*  CALCIUM 9.3  --  8.4*  MG  --   --  1.7  PHOS  --   --  4.2   GFR: Estimated Creatinine Clearance: 35.3 mL/min (A) (by C-G formula based on SCr of 1.99 mg/dL (H)). Liver Function Tests: Recent Labs  Lab 10/09/19 1407 10/10/19 0910  AST 55* 70*  ALT 21 26  ALKPHOS 49 38  BILITOT 0.9 0.4  PROT 7.2 6.0*  ALBUMIN 3.1* 2.4*   No  results for input(s): LIPASE, AMYLASE in the last 168 hours. No results for input(s): AMMONIA in the last 168 hours. Coagulation Profile: No results for input(s): INR, PROTIME in the last 168 hours. Cardiac Enzymes: No results for input(s): CKTOTAL, CKMB, CKMBINDEX, TROPONINI in the last 168 hours. BNP (last 3 results) No results for input(s): PROBNP in the last 8760 hours. HbA1C: Recent Labs    10/09/19 1741  HGBA1C 7.2*   CBG: Recent Labs  Lab 10/09/19 2139 10/10/19 0858  GLUCAP 175* 139*   Lipid Profile: No results for input(s): CHOL, HDL, LDLCALC, TRIG, CHOLHDL, LDLDIRECT in the last 72 hours. Thyroid Function Tests: No results for input(s): TSH, T4TOTAL, FREET4, T3FREE, THYROIDAB in the last 72 hours. Anemia Panel: Recent Labs    10/09/19 1741 10/10/19 1040  FERRITIN 158 81   Urine analysis:    Component Value Date/Time   COLORURINE YELLOW 05/21/2011 [place] 05/21/2011 0846   LABSPEC  1.012 05/21/2011 0846   PHURINE 6.5 05/21/2011 0846   GLUCOSEU 100 (A) 05/21/2011 0846   HGBUR NEGATIVE 05/21/2011 0846   BILIRUBINUR NEGATIVE 05/21/2011 0846   KETONESUR NEGATIVE 05/21/2011 0846   PROTEINUR 100 (A) 05/21/2011 0846   UROBILINOGEN 1.0 05/21/2011 0846   NITRITE NEGATIVE 05/21/2011 0846   LEUKOCYTESUR NEGATIVE 05/21/2011 0846   Sepsis Labs: @LABRCNTIP (procalcitonin:4,lacticidven:4)  )No results found for this or any previous visit (from the past 240 hour(s)).    Studies: Dg Chest 2 View  Result Date: 10/09/2019 CLINICAL DATA:  Pt arrives via [facility] from his pcp office, pt reports cough since tuesday, O2 sat was 88% on room air, placed on 4L O2 via n.c. and O2 sat improved to 93%. bp 130/72, HR 90 NSR, CBG 153, Temp 99.9. pt denies any cp, sob, body aches or chills. EXAM: CHEST - 2 VIEW COMPARISON:  05/21/2011 FINDINGS: New interstitial and patchy alveolar opacities in the right mid and lower lung. Mild interstitial prominence at the left lung  base. Heart size and mediastinal contours are within normal limits. Previous median sternotomy and CABG. No effusion. No pneumothorax. Visualized bones unremarkable. IMPRESSION: New interstitial and patchy alveolar opacities predominantly in the right mid and lower lung suspicious for multifocal or viral pneumonia. Electronically Signed   By: Lucrezia Europe M.D.   On: 10/09/2019 14:35    Scheduled Meds: . aspirin EC  81 mg Oral Daily  . dexamethasone (DECADRON) injection  6 mg Intravenous Q24H  . docusate sodium  100 mg Oral BID  . famotidine  40 mg Oral BID  . fenofibrate  160 mg Oral Daily  . insulin aspart  0-15 Units Subcutaneous TID WC  . insulin aspart  0-5 Units Subcutaneous QHS  . rosuvastatin  20 mg Oral Daily  . sodium chloride flush  3 mL Intravenous Q12H  . sodium chloride flush  3 mL Intravenous Q12H    Continuous Infusions: . sodium chloride    . azithromycin Stopped (10/09/19 2108)  . cefTRIAXone (ROCEPHIN)  IV Stopped (10/09/19 2001)  . heparin 1,000 Units/hr (10/09/19 2007)  . remdesivir 100 mg in NS 100 mL Stopped (10/10/19 1122)     LOS: 1 day     Kayleen Memos, MD [facility] Pager [phone]  If 7PM-7AM, please contact night-coverage [email or web address] Password [email or web address] 10/10/2019, 1:20 PM

## 2019-10-10 NOTE — ED Notes (Signed)
Lunch tray ordered for the patient.

## 2019-10-10 NOTE — ED Notes (Signed)
Heart healthy carb modified dinner tray ordered 

## 2019-10-10 NOTE — ED Notes (Signed)
[facility] called for transport to [facility] 

## 2019-10-10 NOTE — ED Notes (Signed)
Oral and inhaled meds not yet received from pharmacy

## 2019-10-11 ENCOUNTER — Ambulatory Visit: Payer: Medicare HMO | Admitting: Podiatry

## 2019-10-11 ENCOUNTER — Inpatient Hospital Stay (HOSPITAL_COMMUNITY): Payer: Medicare HMO

## 2019-10-11 DIAGNOSIS — J9600 Acute respiratory failure, unspecified whether with hypoxia or hypercapnia: Secondary | ICD-10-CM | POA: Diagnosis present

## 2019-10-11 DIAGNOSIS — J069 Acute upper respiratory infection, unspecified: Secondary | ICD-10-CM

## 2019-10-11 DIAGNOSIS — U071 COVID-19: Secondary | ICD-10-CM | POA: Diagnosis present

## 2019-10-11 LAB — CBC WITH DIFFERENTIAL/PLATELET
Abs Immature Granulocytes: 0.07 10*3/uL (ref 0.00–0.07)
Basophils Absolute: 0 10*3/uL (ref 0.0–0.1)
Basophils Relative: 0 %
Eosinophils Absolute: 0 10*3/uL (ref 0.0–0.5)
Eosinophils Relative: 0 %
HCT: 38.8 % — ABNORMAL LOW (ref 39.0–52.0)
Hemoglobin: 12.3 g/dL — ABNORMAL LOW (ref 13.0–17.0)
Immature Granulocytes: 1 %
Lymphocytes Relative: 10 %
Lymphs Abs: 1.4 10*3/uL (ref 0.7–4.0)
MCH: 25.4 pg — ABNORMAL LOW (ref 26.0–34.0)
MCHC: 31.7 g/dL (ref 30.0–36.0)
MCV: 80.2 fL (ref 80.0–100.0)
Monocytes Absolute: 0.8 10*3/uL (ref 0.1–1.0)
Monocytes Relative: 6 %
Neutro Abs: 11.4 10*3/uL — ABNORMAL HIGH (ref 1.7–7.7)
Neutrophils Relative %: 83 %
Platelets: 314 10*3/uL (ref 150–400)
RBC: 4.84 MIL/uL (ref 4.22–5.81)
RDW: 16.7 % — ABNORMAL HIGH (ref 11.5–15.5)
WBC: 13.7 10*3/uL — ABNORMAL HIGH (ref 4.0–10.5)
nRBC: 0 % (ref 0.0–0.2)

## 2019-10-11 LAB — MAGNESIUM: Magnesium: 2.4 mg/dL (ref 1.7–2.4)

## 2019-10-11 LAB — CBG MONITORING, ED
Glucose-Capillary: 139 mg/dL — ABNORMAL HIGH (ref 70–99)
Glucose-Capillary: 158 mg/dL — ABNORMAL HIGH (ref 70–99)
Glucose-Capillary: 259 mg/dL — ABNORMAL HIGH (ref 70–99)

## 2019-10-11 LAB — COMPREHENSIVE METABOLIC PANEL
ALT: 33 U/L (ref 0–44)
AST: 89 U/L — ABNORMAL HIGH (ref 15–41)
Albumin: 2.7 g/dL — ABNORMAL LOW (ref 3.5–5.0)
Alkaline Phosphatase: 51 U/L (ref 38–126)
Anion gap: 13 (ref 5–15)
BUN: 36 mg/dL — ABNORMAL HIGH (ref 8–23)
CO2: 17 mmol/L — ABNORMAL LOW (ref 22–32)
Calcium: 8.6 mg/dL — ABNORMAL LOW (ref 8.9–10.3)
Chloride: 107 mmol/L (ref 98–111)
Creatinine, Ser: 1.8 mg/dL — ABNORMAL HIGH (ref 0.61–1.24)
GFR calc Af Amer: 42 mL/min — ABNORMAL LOW (ref >60)
GFR calc non Af Amer: 36 mL/min — ABNORMAL LOW (ref >60)
Glucose, Bld: 167 mg/dL — ABNORMAL HIGH (ref 70–99)
Potassium: 3.9 mmol/L (ref 3.5–5.1)
Sodium: 137 mmol/L (ref 135–145)
Total Bilirubin: 0.4 mg/dL (ref 0.3–1.2)
Total Protein: 6.9 g/dL (ref 6.5–8.1)

## 2019-10-11 LAB — PROCALCITONIN: Procalcitonin: 1.01 ng/mL

## 2019-10-11 LAB — D-DIMER, QUANTITATIVE: D-Dimer, Quant: 1.39 ug/mL-FEU — ABNORMAL HIGH (ref 0.00–0.50)

## 2019-10-11 LAB — C-REACTIVE PROTEIN: CRP: 19.4 mg/dL — ABNORMAL HIGH (ref <1.0)

## 2019-10-11 LAB — GLUCOSE, CAPILLARY: Glucose-Capillary: 199 mg/dL — ABNORMAL HIGH (ref 70–99)

## 2019-10-11 LAB — LACTATE DEHYDROGENASE: LDH: 463 U/L — ABNORMAL HIGH (ref 98–192)

## 2019-10-11 LAB — PHOSPHORUS: Phosphorus: 3.1 mg/dL (ref 2.5–4.6)

## 2019-10-11 LAB — HEPARIN LEVEL (UNFRACTIONATED): Heparin Unfractionated: 0.17 IU/mL — ABNORMAL LOW (ref 0.30–0.70)

## 2019-10-11 LAB — FERRITIN: Ferritin: 337 ng/mL — ABNORMAL HIGH (ref 24–336)

## 2019-10-11 MED ORDER — SODIUM CHLORIDE 0.9 % IV SOLN
2.0000 g | Freq: Two times a day (BID) | INTRAVENOUS | Status: DC
  Administered 2019-10-11 – 2019-10-12 (×3): 2 g via INTRAVENOUS
  Filled 2019-10-11 (×3): qty 2

## 2019-10-11 MED ORDER — TOCILIZUMAB 400 MG/20ML IV SOLN
8.0000 mg/kg | Freq: Once | INTRAVENOUS | Status: AC
  Administered 2019-10-11: 686 mg via INTRAVENOUS
  Filled 2019-10-11: qty 34.3

## 2019-10-11 MED ORDER — HYDRALAZINE HCL 20 MG/ML IJ SOLN
10.0000 mg | Freq: Once | INTRAMUSCULAR | Status: AC
  Administered 2019-10-11: 10 mg via INTRAVENOUS
  Filled 2019-10-11: qty 1

## 2019-10-11 MED ORDER — SODIUM CHLORIDE 0.9% IV SOLUTION: Freq: Once | INTRAVENOUS | Status: DC

## 2019-10-11 MED ORDER — VANCOMYCIN HCL IN DEXTROSE 1-5 GM/200ML-% IV SOLN: 1000.0000 mg | INTRAVENOUS | Status: DC

## 2019-10-11 MED ORDER — LABETALOL HCL 200 MG PO TABS: 200.0000 mg | ORAL_TABLET | Freq: Three times a day (TID) | ORAL | Status: DC

## 2019-10-11 MED ORDER — AMLODIPINE BESYLATE 5 MG PO TABS
5.0000 mg | ORAL_TABLET | Freq: Every day | ORAL | Status: DC
  Administered 2019-10-11: 5 mg via ORAL
  Filled 2019-10-11: qty 1

## 2019-10-11 MED ORDER — LABETALOL HCL 200 MG PO TABS: 100.0000 mg | ORAL_TABLET | Freq: Three times a day (TID) | ORAL | Status: DC

## 2019-10-11 MED ORDER — FUROSEMIDE 10 MG/ML IJ SOLN
40.0000 mg | Freq: Once | INTRAMUSCULAR | Status: AC
  Administered 2019-10-11: 40 mg via INTRAVENOUS
  Filled 2019-10-11: qty 4

## 2019-10-11 MED ORDER — AMLODIPINE BESYLATE 10 MG PO TABS
10.0000 mg | ORAL_TABLET | Freq: Every day | ORAL | Status: DC
  Administered 2019-10-12 – 2019-10-13 (×2): 10 mg via ORAL
  Filled 2019-10-11 (×2): qty 1

## 2019-10-11 MED ORDER — FUROSEMIDE 10 MG/ML IJ SOLN: 40.0000 mg | Freq: Every day | INTRAMUSCULAR | Status: DC

## 2019-10-11 MED ORDER — HYDRALAZINE HCL 10 MG PO TABS
10.0000 mg | ORAL_TABLET | Freq: Three times a day (TID) | ORAL | Status: DC
  Administered 2019-10-11 – 2019-10-12 (×2): 10 mg via ORAL
  Filled 2019-10-11 (×7): qty 1

## 2019-10-11 MED ORDER — AMLODIPINE BESYLATE 5 MG PO TABS
5.0000 mg | ORAL_TABLET | Freq: Once | ORAL | Status: AC
  Administered 2019-10-11: 5 mg via ORAL
  Filled 2019-10-11: qty 1

## 2019-10-11 MED ORDER — ASCORBIC ACID 500 MG PO TABS
500.0000 mg | ORAL_TABLET | Freq: Every day | ORAL | Status: DC
  Administered 2019-10-12 – 2019-10-23 (×12): 500 mg via ORAL
  Filled 2019-10-11 (×13): qty 1

## 2019-10-11 MED ORDER — HEPARIN SODIUM (PORCINE) 5000 UNIT/ML IJ SOLN: 5000.0000 [IU] | Freq: Three times a day (TID) | INTRAMUSCULAR | Status: DC

## 2019-10-11 MED ORDER — ENOXAPARIN SODIUM 40 MG/0.4ML ~~LOC~~ SOLN
40.0000 mg | SUBCUTANEOUS | Status: DC
  Administered 2019-10-11 – 2019-10-13 (×3): 40 mg via SUBCUTANEOUS
  Filled 2019-10-11 (×3): qty 0.4

## 2019-10-11 MED ORDER — HEPARIN BOLUS VIA INFUSION
2000.0000 [IU] | Freq: Once | INTRAVENOUS | Status: AC
  Administered 2019-10-11: 2000 [IU] via INTRAVENOUS
  Filled 2019-10-11: qty 2000

## 2019-10-11 MED ORDER — ENSURE ENLIVE PO LIQD
237.0000 mL | Freq: Two times a day (BID) | ORAL | Status: DC
  Administered 2019-10-12 – 2019-10-13 (×4): 237 mL via ORAL

## 2019-10-11 MED ORDER — VANCOMYCIN HCL 10 G IV SOLR
1750.0000 mg | Freq: Once | INTRAVENOUS | Status: AC
  Administered 2019-10-11: 1750 mg via INTRAVENOUS
  Filled 2019-10-11: qty 1750

## 2019-10-11 NOTE — ED Notes (Signed)
Called respiratory to assess O2 and breathing.

## 2019-10-11 NOTE — ED Notes (Signed)
Pt continues to be alert and oriented.  He continues to deny chest pain.  Pt was placed on a hospital bed for comfort.

## 2019-10-11 NOTE — Progress Notes (Signed)
Pt admitted to [facility] from [facility]. A/Ox4 MD came to assess. Pt updated wife on phone while I was in the room Given x1 dose Actemra x1 IV 10mg  hydralazine for bp [id number] systolic No pain Tachypneic (26-32) **12L/HFNC 90-94% Voids in urinal  All questions and concerns addressed Tele & cont O2 monitoring initiated  **Awaiting Plasma delivery**

## 2019-10-11 NOTE — ED Notes (Signed)
Respiratory @ bedside

## 2019-10-11 NOTE — ED Notes (Signed)
Called [facility] at [phone] . transfering to [facility] , - ac

## 2019-10-11 NOTE — Progress Notes (Signed)
2D echo reviewed.  Ef stable at 45-50%.  Read out as mildly decreased RVF but reviewed images and feel RV is normal in size and function.  No further recs at this time.  Will sign off.  Call with any questions.

## 2019-10-11 NOTE — Progress Notes (Signed)
Pharmacy Antibiotic Note  Matthew Bates is a 73 y.o. male admitted on 10/09/2019 with pneumonia, COVID-19 positive, previously started on ceftriaxone/azithro on 12/5. Pharmacy has been consulted for vancomycin/cefepime dosing. Afebrile, WBC up to 13.7 (on IV steroids). SCr down to 1.8.  Plan: Cefepime 2g IV q12h Vancomycin 1750mg  IV x1; then Vancomycin 1000 mg IV Q 24 hrs. Goal AUC 400-550. Expected AUC: 472 SCr used: 1.8 Azithromycin 500mg  IV q24h per MD - f/u if continue Monitor clinical progress, c/s, renal function F/u de-escalation plan/LOT, vancomycin levels as indicated   Height: 5\' 9"  (175.3 cm) Weight: 189 lb (85.7 kg) IBW/kg (Calculated) : 70.7  Temp (24hrs), Avg:97.8 F (36.6 C), Min:97.8 F (36.6 C), Max:97.8 F (36.6 C)  Recent Labs  Lab 10/09/19 1407 10/10/19 0910 10/11/19 0326  WBC 8.7 9.0 13.7*  CREATININE 1.97* 1.99* 1.80*    Estimated Creatinine Clearance: 39.1 mL/min (A) (by C-G formula based on SCr of 1.8 mg/dL (H)).    Allergies  Allergen Reactions  . Metoclopramide Hcl Hypertension    Reglan=Increases HBP symptoms    Antimicrobials this admission: 12/7 vancomycin >>  12/7 cefepime >>  12/5 ceftriaxone >> 12/5 azithro >>   Dose adjustments this admission:   Microbiology results:   Elicia Lamp, PharmD, BCPS Clinical Pharmacist 10/11/2019 9:09 AM

## 2019-10-11 NOTE — ED Notes (Signed)
RN assessed pt, pt is alert and oriented at this time.  He indicated that he is not having any chest pain at this time, had some earlier in the evening. Will continue to monitor

## 2019-10-11 NOTE — ED Notes (Signed)
Breakfast ordered 

## 2019-10-11 NOTE — ED Notes (Signed)
report given to brooke rn transported by [employer]

## 2019-10-11 NOTE — ED Notes (Signed)
This RN messaged pharmacy to validate morning medications

## 2019-10-11 NOTE — Progress Notes (Addendum)
PROGRESS NOTE  Matthew Bates [id number] DOB: 1946/05/19 DOA: 10/09/2019 PCP: Marton Redwood, MD  HPI/Recap of past 34 hours: 73 year old male with past medical history significant for chronic systolic CHF, coronary artery disease, hypertension, hyperlipidemia, type 2 diabetes, carotid stenosis, obesity, CKD who presented to the ED with complaints of shortness of breath and fatigue of 2 to 3 days duration.  Have positive sick contact with COVID-19 at Thanksgiving.  In the ER he has noticed a persistent chest tightness not exertional and not relieved with rest.  COVID-19 positive on 10/09/2019.  Significantly hypoxic now on 4 L by nasal cannula.  Chest x-ray showing bilateral patchy pulmonary infiltrates right greater than left.  Started on remdesivir and Decadron.  Significantly elevated troponin S greater than 2500.  2D echo ordered and completed.  No significant changes in his EF.  Seen by cardiology.  Dr. Radford Pax also reviewed 2D echo.  Continue medical management.  No further recommendations at this time.  Signed off.  One episode of chest pain overnight which resolved this morning. On HFNC at 10L with O2 saturation 90%.  Discussed with PCCM, Dr. Valeta Harms.  We will start phonation to improve lung aeration.  10/11/19: Seen and examined.  States he feels better.  Conversational dyspnea noted.  Repeated chest x-ray which showed worsening bilateral infiltrates.  Gave 2 doses of IV Lasix 40 mg x 2.  Cardio signed off.  Will transfer to [facility] to continue care.  Patient's wife has been updated with the patient's permission.  All questions answered to her satisfaction.   Assessment/Plan: Principal Problem:   Acute respiratory disease due to COVID-19 virus Active Problems:   Hypertension   Coronary artery disease   Hyperlipidemia   OSA (obstructive sleep apnea)   Carotid stenosis, bilateral   Diabetes mellitus type 2 in obese (HCC)   Acute hypoxemic respiratory failure due to COVID-19 [facility])  Acute  COVID-19 viral pneumonia with concern for superimposed bacterial community-acquired pneumonia Initially on nonrebreather on admission which improved to 4 L on remdesivir and Decadron yesterday.  This morning, worsening respiratory status now requiring 10 L high flow nasal cannula. COVID-19 positive on 10/09/19 Continue IV remdesivir, to complete 5 days and IV Decadron, to complete 10 days. Procalcitonin and white count trending up, upgraded antibiotics to IV vancomycin and cefepime.  Continue IV azithromycin. Repeated chest x-ray this morning which showed worsening bilateral pulmonary infiltrates. High risk for intubation, discussed with PCCM Dr. Valeta Harms.  If oxygen requirement increases, can start heated high flow. Maintain O2 saturation greater than 92%. Blood cultures in process Continue Xopenex inhaler and ipratropium inhaler 3 times daily Continue incentive spirometer Continue oral supplement vitamin C, D3 and zinc. Received 2 doses of IV Lasix 40 mg x 2. Start pronation Q2 hours Continue to trend inflammatory markers  Acute hypoxic respiratory failure secondary to acute COVID-19 viral pneumonia Management as stated above  Elevated troponin likely secondary to demand ischemia from severe hypoxia Troponin S peaked at greater than 2900, then trended down. 2D echo reviewed by cardiology no significant changes LVEF 45 to 50%.  Recommend continue medical management, no further recommendations. Continue to closely monitor on telemetry.  Chronic systolic CHF 2D echo LVEF 45 to 50% Continue strict I's and O's and daily weight Received 2 doses of IV Lasix 40 mg x 2.  Type 2 diabetes, well controlled A1c 7.2 on 10/09/2019. 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 Continue sodium bicarb 1300 mg twice daily x2 days. Continue daily BMPs  Resolved post repletion: Hypomagnesemia Magnesium 2.4 after repletion.  Essential hypertension, uncontrolled Labetalol hold due to worsening respiratory status Started Norvasc 10 mg daily Received 2 doses of IV Lasix 40 mg Losartan on hold due to AKI Continue to closely monitor vital signs  Worsening AST Continue to monitor on remdesivir and statin Continue daily CMPs  Coronary artery disease Continue aspirin, Crestor and fenofibrate  OSA Cannot use CPAP due to concern for aerosolization of particles. Can use high flow nasal cannula or high heated flow or nonrebreather as needed  DVT prophylaxis:   Subcu Lovenox daily. Code Status:  Full - confirmed with patient Family Communication:  Updated his wife via phone x2.   Disposition Plan:   Transfer to [facility] to continue care.  Consults called:  Cardiology, signed off on 10/11/2019.Marland Kitchen      Objective: Vitals:   10/11/19 0700 10/11/19 0800 10/11/19 0830 10/11/19 0900  BP: (!) 185/77 (!) 183/85 (!) 183/85 (!) 183/72  Pulse: 87 87  92  Resp: (!) 27 (!) 28  (!) 25  Temp:      TempSrc:      SpO2: 93% 90%  (!) 87%  Weight:      Height:        Intake/Output Summary (Last 24 hours) at 10/11/2019 1040 Last data filed at 10/11/2019 0834 Gross per 24 hour  Intake 250 ml  Output 1250 ml  Net -1000 ml   Filed Weights   10/09/19 1900  Weight: 85.7 kg    Exam:  . General: 73 y.o. year-old male well-developed well-nourished with conversational dyspnea.   . Cardiovascular: Regular rate and rhythm no rubs or gallops.  Respiratory: Diffuse rales bilaterally.  No wheezing noted.  Abdomen: Obese nontender normal bowel sounds present. . Musculoskeletal:  Trace lower extremity edema.  2/4 pulses in all 4 extremities.   Marland Kitchen Psychiatry: Mood is appropriate for condition and setting.   Data Reviewed: CBC: Recent Labs  Lab 10/09/19 1407 10/09/19 2007 10/10/19 0910 10/11/19 0326  WBC 8.7  --  9.0 13.7*  NEUTROABS 7.1  --  7.3 11.4*  HGB 11.7* 11.6* 10.9* 12.3*  HCT 37.6* 34.0* 35.0* 38.8*  MCV 80.9  --  79.7* 80.2  PLT 243  --  246 [id number]   Basic Metabolic Panel: Recent Labs  Lab 10/09/19 1407 10/09/19 2007 10/10/19 0910 10/11/19 0326  NA 132* 132* 136 137  K 3.8 3.9 3.7 3.9  CL 102  --  106 107  CO2 18*  --  19* 17*  GLUCOSE 152*  --  147* 167*  BUN 24*  --  32* 36*  CREATININE 1.97*  --  1.99* 1.80*  CALCIUM 9.3  --  8.4* 8.6*  MG  --   --  1.7 2.4  PHOS  --   --  4.2 3.1   GFR: Estimated Creatinine Clearance: 39.1 mL/min (A) (by C-G formula based on SCr of 1.8 mg/dL (H)). Liver Function Tests: Recent Labs  Lab 10/09/19 1407 10/10/19 0910 10/11/19 0326  AST 55* 70* 89*  ALT 21 26 33  ALKPHOS 49 38 51  BILITOT 0.9 0.4 0.4  PROT 7.2 6.0* 6.9  ALBUMIN 3.1* 2.4* 2.7*   No results for input(s): LIPASE, AMYLASE in the last 168 hours. No results for input(s): AMMONIA in the last 168 hours. Coagulation Profile: No results for input(s): INR, PROTIME in the last 168 hours. Cardiac Enzymes: No results for input(s): CKTOTAL, CKMB, CKMBINDEX, TROPONINI in the last 168 hours. BNP (last 3 results) No results for input(s): PROBNP in the last 8760 hours. HbA1C: Recent Labs    10/09/19 1741  HGBA1C 7.2*   CBG: Recent Labs  Lab 10/10/19 1335 10/10/19 1658 10/10/19 1940 10/11/19 0041 10/11/19 0814  GLUCAP 179* 226* 175* 158* 139*   Lipid Profile: No results for input(s): CHOL, HDL, LDLCALC, TRIG, CHOLHDL, LDLDIRECT in the last 72 hours. Thyroid Function Tests: No results for input(s): TSH, T4TOTAL, FREET4, T3FREE, THYROIDAB in the last 72 hours. Anemia Panel: Recent Labs    10/10/19 1040 10/11/19 0326  FERRITIN  81 337*   Urine analysis:    Component Value Date/Time   COLORURINE YELLOW 05/21/2011 0846   APPEARANCEUR CLEAR 05/21/2011 0846   LABSPEC 1.012 05/21/2011 0846   PHURINE 6.5 05/21/2011 0846   GLUCOSEU 100 (A) 05/21/2011 0846   HGBUR NEGATIVE 05/21/2011 0846   BILIRUBINUR NEGATIVE 05/21/2011 0846   KETONESUR NEGATIVE 05/21/2011 0846   PROTEINUR 100 (A) 05/21/2011 0846   UROBILINOGEN 1.0 05/21/2011 0846   NITRITE NEGATIVE 05/21/2011 0846   LEUKOCYTESUR NEGATIVE 05/21/2011 0846   Sepsis Labs: @LABRCNTIP (procalcitonin:4,lacticidven:4)  )No results found for this or any previous visit (from the past 240 hour(s)).    Studies: Dg Chest Port 1 View  Result Date: 10/11/2019 CLINICAL DATA:  Hypoxia. EXAM: PORTABLE CHEST 1 VIEW COMPARISON:  10/09/2019. FINDINGS: Prior CABG. Cardiomegaly. Progressive severe diffuse bilateral pulmonary infiltrates/edema. No pleural effusion or pneumothorax. No acute bony abnormality. IMPRESSION: 1. Progressive severe diffuse bilateral pulmonary infiltrates/edema. 2.  Prior CABG.  Stable cardiomegaly. Electronically Signed   By: Marcello Moores  Register   On: 10/11/2019 07:55    Scheduled Meds: . Derrill Memo ON 10/12/2019] amLODipine  10 mg Oral Daily  . amLODipine  5 mg Oral Once  . aspirin EC  81 mg Oral Daily  . cholecalciferol  1,000 Units Oral Daily  . dexamethasone (DECADRON) injection  6 mg Intravenous Q24H  . docusate sodium  100 mg Oral BID  . famotidine  40 mg Oral BID  . fenofibrate  160 mg Oral Daily  . [START ON 10/12/2019] furosemide  40 mg Intravenous Daily  . insulin aspart  0-15 Units Subcutaneous TID WC  . insulin aspart  0-5 Units Subcutaneous QHS  . ipratropium  2 puff Inhalation Q8H  . levalbuterol  2 puff Inhalation Q8H  . rosuvastatin  20 mg Oral Daily  . sodium bicarbonate  1,300 mg Oral BID  . sodium chloride flush  3 mL Intravenous Q12H  . sodium chloride flush  3 mL Intravenous Q12H  . vitamin C  500 mg Oral Daily  . zinc sulfate  220  mg Oral Daily    Continuous Infusions: . sodium chloride    . azithromycin Stopped (10/10/19 2156)  . ceFEPime (MAXIPIME) IV    . heparin 1,250 Units/hr (10/11/19 0551)  . remdesivir 100 mg in NS 100 mL Stopped (10/10/19 1122)  . vancomycin    . [START ON 10/12/2019] vancomycin       LOS: 2 days     Kayleen Memos, MD [facility] Pager [phone]  If 7PM-7AM, please contact night-coverage [email or web address]  Password [email or web address] 10/11/2019, 10:40 AM

## 2019-10-11 NOTE — Progress Notes (Signed)
[place] for heparin Indication: chest pain/ACS  Allergies  Allergen Reactions  . Metoclopramide Hcl Hypertension    Reglan=Increases HBP symptoms    Patient Measurements: Height: 5\' 9"  (175.3 cm) Weight: 189 lb (85.7 kg) IBW/kg (Calculated) : 70.7 Heparin Dosing Weight: 85.7kg  Vital Signs: Temp: 97.8 F (36.6 C) (12/07 0139) Temp Source: Oral (12/07 0139) BP: 152/72 (12/07 0500) Pulse Rate: 77 (12/07 0500)  Labs: Recent Labs    10/09/19 1407  10/09/19 2007 10/10/19 0910 10/10/19 1348 10/10/19 2012 10/11/19 0326  HGB 11.7*  --  11.6* 10.9*  --   --  12.3*  HCT 37.6*  --  34.0* 35.0*  --   --  38.8*  PLT 243  --   --  246  --   --  314  HEPARINUNFRC  --   --   --  0.45  --   --  0.17*  CREATININE 1.97*  --   --  1.99*  --   --  1.80*  TROPONINIHS  --    < >  --  2,955* 2,825* 2,115*  --    < > = values in this interval not displayed.    Estimated Creatinine Clearance: 39.1 mL/min (A) (by C-G formula based on SCr of 1.8 mg/dL (H)).  Medications:  Infusions:  . sodium chloride    . azithromycin Stopped (10/10/19 2156)  . cefTRIAXone (ROCEPHIN)  IV Stopped (10/10/19 2030)  . heparin 1,000 Units/hr (10/10/19 2151)  . remdesivir 100 mg in NS 100 mL Stopped (10/10/19 1122)    Assessment: 73 yo male with SOB and chest pain, found to have elevated cardiac markers, for heparin   Goal of Therapy:  Heparin level 0.3-0.7 units/ml Monitor platelets by anticoagulation protocol: Yes   Plan:  Heparin 2000 units IV bolus, then increase heparin 1250 units/hr Check heparin level in 6 hours.   Tallula Grindle, Bronson Curb 10/11/2019,5:43 AM

## 2019-10-11 NOTE — Progress Notes (Addendum)
Matthew Bates has been transfer to [facility], he has developed worsening hypoxic respiratory failure with increased oxygen requirements, from 4 L of nasal cannula on admission to 10 L per high flow nasal cannula.  His chest radiograph has worsening infiltrates.  This morning he had been diuresed with 40 mg of furosemide.  COVID-19 Labs  Recent Labs    10/09/19 1741 10/10/19 0910 10/10/19 1040 10/11/19 0326  DDIMER 3.94* 2.10*  --  1.39*  FERRITIN 158  --  81 337*  LDH 316*  --   --  463*  CRP 19.8*  --  21.3* 19.4*    Lab Results  Component Value Date   SARSCOV2NAA RESULT:  NEGATIVE 09/17/2019   He has persistent elevated inflammatory markers.  His chest radiograph had multifocal interstitial infiltrates, right upper lobe, right lower lobe, left lower lobe..  RR: 27  Pulse oxymetry: 92%  Fi02: 10 LPM per HFNC.   Patient remains dyspneic and fatigues, he does not have any wheezing not, or lower extremity edema.  I talked to him about the therapeutic intervention of convalescent plasma and Actemra, the benefits and risk of this interventions.  He agrees with the administration of these 2 medications.  I spoke over the phone with the patient's wife about patient's  condition, plan of care and all questions were addressed.

## 2019-10-11 NOTE — ED Notes (Signed)
This RN ordered pt's breakfast tray

## 2019-10-12 DIAGNOSIS — E785 Hyperlipidemia, unspecified: Secondary | ICD-10-CM

## 2019-10-12 DIAGNOSIS — J9601 Acute respiratory failure with hypoxia: Secondary | ICD-10-CM

## 2019-10-12 DIAGNOSIS — J9600 Acute respiratory failure, unspecified whether with hypoxia or hypercapnia: Secondary | ICD-10-CM

## 2019-10-12 DIAGNOSIS — G4733 Obstructive sleep apnea (adult) (pediatric): Secondary | ICD-10-CM

## 2019-10-12 DIAGNOSIS — I6523 Occlusion and stenosis of bilateral carotid arteries: Secondary | ICD-10-CM

## 2019-10-12 LAB — COMPREHENSIVE METABOLIC PANEL
ALT: 41 U/L (ref 0–44)
AST: 85 U/L — ABNORMAL HIGH (ref 15–41)
Albumin: 2.6 g/dL — ABNORMAL LOW (ref 3.5–5.0)
Alkaline Phosphatase: 67 U/L (ref 38–126)
Anion gap: 14 (ref 5–15)
BUN: 35 mg/dL — ABNORMAL HIGH (ref 8–23)
CO2: 21 mmol/L — ABNORMAL LOW (ref 22–32)
Calcium: 8.4 mg/dL — ABNORMAL LOW (ref 8.9–10.3)
Chloride: 106 mmol/L (ref 98–111)
Creatinine, Ser: 1.56 mg/dL — ABNORMAL HIGH (ref 0.61–1.24)
GFR calc Af Amer: 50 mL/min — ABNORMAL LOW (ref >60)
GFR calc non Af Amer: 43 mL/min — ABNORMAL LOW (ref >60)
Glucose, Bld: 190 mg/dL — ABNORMAL HIGH (ref 70–99)
Potassium: 3.5 mmol/L (ref 3.5–5.1)
Sodium: 141 mmol/L (ref 135–145)
Total Bilirubin: 0.6 mg/dL (ref 0.3–1.2)
Total Protein: 6.8 g/dL (ref 6.5–8.1)

## 2019-10-12 LAB — CBC WITH DIFFERENTIAL/PLATELET
Abs Immature Granulocytes: 0.1 10*3/uL — ABNORMAL HIGH (ref 0.00–0.07)
Basophils Absolute: 0 10*3/uL (ref 0.0–0.1)
Basophils Relative: 0 %
Eosinophils Absolute: 0 10*3/uL (ref 0.0–0.5)
Eosinophils Relative: 0 %
HCT: 38.6 % — ABNORMAL LOW (ref 39.0–52.0)
Hemoglobin: 12.1 g/dL — ABNORMAL LOW (ref 13.0–17.0)
Immature Granulocytes: 1 %
Lymphocytes Relative: 7 %
Lymphs Abs: 0.7 10*3/uL (ref 0.7–4.0)
MCH: 24.5 pg — ABNORMAL LOW (ref 26.0–34.0)
MCHC: 31.3 g/dL (ref 30.0–36.0)
MCV: 78.3 fL — ABNORMAL LOW (ref 80.0–100.0)
Monocytes Absolute: 0.8 10*3/uL (ref 0.1–1.0)
Monocytes Relative: 8 %
Neutro Abs: 8.8 10*3/uL — ABNORMAL HIGH (ref 1.7–7.7)
Neutrophils Relative %: 84 %
Platelets: 347 10*3/uL (ref 150–400)
RBC: 4.93 MIL/uL (ref 4.22–5.81)
RDW: 16.8 % — ABNORMAL HIGH (ref 11.5–15.5)
WBC: 10.3 10*3/uL (ref 4.0–10.5)
nRBC: 0 % (ref 0.0–0.2)

## 2019-10-12 LAB — FERRITIN: Ferritin: 384 ng/mL — ABNORMAL HIGH (ref 24–336)

## 2019-10-12 LAB — C-REACTIVE PROTEIN: CRP: 13.8 mg/dL — ABNORMAL HIGH (ref <1.0)

## 2019-10-12 LAB — GLUCOSE, CAPILLARY
Glucose-Capillary: 178 mg/dL — ABNORMAL HIGH (ref 70–99)
Glucose-Capillary: 201 mg/dL — ABNORMAL HIGH (ref 70–99)
Glucose-Capillary: 207 mg/dL — ABNORMAL HIGH (ref 70–99)
Glucose-Capillary: 276 mg/dL — ABNORMAL HIGH (ref 70–99)
Glucose-Capillary: 313 mg/dL — ABNORMAL HIGH (ref 70–99)

## 2019-10-12 LAB — PHOSPHORUS: Phosphorus: 2.3 mg/dL — ABNORMAL LOW (ref 2.5–4.6)

## 2019-10-12 LAB — MAGNESIUM: Magnesium: 2 mg/dL (ref 1.7–2.4)

## 2019-10-12 LAB — TYPE AND SCREEN
ABO/RH(D): A POS
Antibody Screen: NEGATIVE

## 2019-10-12 LAB — D-DIMER, QUANTITATIVE: D-Dimer, Quant: 1.54 ug/mL-FEU — ABNORMAL HIGH (ref 0.00–0.50)

## 2019-10-12 LAB — HEPATITIS B SURFACE ANTIGEN: Hepatitis B Surface Ag: NONREACTIVE

## 2019-10-12 LAB — ABO/RH: ABO/RH(D): A POS

## 2019-10-12 MED ORDER — LOSARTAN POTASSIUM 25 MG PO TABS
100.0000 mg | ORAL_TABLET | Freq: Every day | ORAL | Status: DC
  Administered 2019-10-12 – 2019-10-13 (×2): 100 mg via ORAL
  Filled 2019-10-12 (×2): qty 4

## 2019-10-12 MED ORDER — LABETALOL HCL 100 MG PO TABS
200.0000 mg | ORAL_TABLET | Freq: Three times a day (TID) | ORAL | Status: DC
  Administered 2019-10-12 – 2019-10-13 (×4): 200 mg via ORAL
  Administered 2019-10-13: 22:00:00 100 mg via ORAL
  Filled 2019-10-12 (×5): qty 2

## 2019-10-12 NOTE — Progress Notes (Signed)
Initial Nutrition Assessment  RD working remotely.  DOCUMENTATION CODES:   Not applicable  INTERVENTION:   - Liberalize diet to Regular, verbal with readback order placed per MD  - Continue Ensure Enlive po BID, each supplement provides 350 kcal and 20 grams of protein  - Magic cup BID with lunch and dinner meals, each supplement provides 290 kcal and 9 grams of protein  NUTRITION DIAGNOSIS:   Increased nutrient needs related to acute illness (COVID-19 pneumonia) as evidenced by estimated needs.  GOAL:   Patient will meet greater than or equal to 90% of their needs  MONITOR:   PO intake, Supplement acceptance, Labs, Weight trends  REASON FOR ASSESSMENT:   Malnutrition Screening Tool    ASSESSMENT:   73 year old male who presented to the ED on 12/05 with cough and SOB. PMH of HTN, HLD, T2DM, CAD s/p CABG in 2010, CKD stage III, GERD. Pt found to be COVID-19 positive and admitted with COVID-19 pneumonia and NSTEMI.   Pt is currently on a Heart Healthy/Carb Modified diet. Meal completion of 50% recorded for dinner meal on 12/07. No other meal completions listed.  Reviewed weight history in chart. Weight stable over the last several months. Of note, weight of 189 lbs on admission appears stated rather than measured.  Discussed liberalizing diet with MD who agreed. Will continue with Ensure Enlive supplements as ordered.  Medications reviewed and include: cholecalciferol, decadron, colace, Ensure Enlive BID, fenofibrate, SSI, vitamin C, zinc sulfate, IV abx, remdesivir  Labs reviewed: phosphorus 2.3 CBG's: 178-207 x 24 hours  UOP: 1700 ml x 24 hours  NUTRITION - FOCUSED PHYSICAL EXAM:  Unable to complete at this time. RD working remotely.  Diet Order:   Diet Order            Diet regular Room service appropriate? Yes; Fluid consistency: Thin  Diet effective now              EDUCATION NEEDS:   No education needs have been identified at this time  Skin:   Skin Assessment: Reviewed RN Assessment  Last BM:  10/11/19  Height:   Ht Readings from Last 1 Encounters:  10/09/19 5\' 9"  (1.753 m)    Weight:   Wt Readings from Last 1 Encounters:  10/09/19 85.7 kg    Ideal Body Weight:  72.7 kg  BMI:  Body mass index is 27.91 kg/m.  Estimated Nutritional Needs:   Kcal:  2100-2300  Protein:  100-115 grams  Fluid:  >/= 2.0 L    Gaynell Face, MS, RD, LDN Inpatient Clinical Dietitian Pager: [phone] Weekend/After Hours: [phone]

## 2019-10-12 NOTE — Progress Notes (Signed)
PROGRESS NOTE    Matthew Bates  [id number] DOB: Mar 31, 1946 DOA: 10/09/2019 PCP: Marton Redwood, MD    Brief Narrative:  73 year old male who presented with dyspnea.  He does have significant past medical history for hypertension, dyslipidemia, type 2 diabetes mellitus, coronary artery disease and chronic kidney disease.  Patient reported 2 to 3 days of worsening dyspnea and fatigue, positive sick contact in Thanksgiving.  Due to worsening symptoms he presented to the emergency department, on his initial physical examination his oximetry was in the mid 80s, on supplemental oxygen 2 to 3 L per nasal cannula, his blood pressure was 158/98, heart rate 104, respiratory 29.  His lungs had diffuse rhonchi, increased work of breathing, heart S1-S2 present and rhythmic, abdomen soft, no lower extremity edema. Sodium 132, potassium 3.8, chloride 102, bicarb 18, glucose 152, BUN 24, creatinine 1.97, white count 8.7, hemoglobin 11.7, hematocrit 37.6, platelets 243.  SARS COVID-19 was positive.  His a chest radiograph had dense interstitial infiltrates, right lower lobe, left lower lobe and right upper lobe.  His EKG here 100 bpm, normal axis, normal intervals, sinus rhythm, no ST segment T wave changes, positive PVCs.  Patient was admitted to the hospital with a working diagnosis of acute hypoxic respiratory failure due to SARS COVID-19 viral pneumonia.  Further work-up with echocardiography rule out acute coronary syndrome.  Despite medical therapy with remdesivir and dexamethasone he continued to have worsening hypoxemia.  Follow-up radiograph with worsening infiltrates.  He has been transferred to [facility] on December 7, received Actemra 12/07, and convalescent plasma 12/08.    Assessment & Plan:   Principal Problem:   Acute respiratory disease due to COVID-19 virus Active Problems:   Hypertension   Coronary artery disease   Hyperlipidemia   OSA (obstructive sleep apnea)   Carotid  stenosis, bilateral   Diabetes mellitus type 2 in obese ([place])   Acute hypoxemic respiratory failure due to COVID-19 [facility])   Acute respiratory failure due to COVID-19 ([place])   1.  Acute hypoxic respiratory failure due to SARS COVID-19 viral pneumonia.  Sp Actemra 12/07 and convalsescent plasma 12/08  RR: 20  Pulse oxymetry: 90%  Fi02: 12 LPM per HFNC  COVID-19 Labs  Recent Labs    10/09/19 1741 10/10/19 0910 10/10/19 1040 10/11/19 0326 10/12/19 0355  DDIMER 3.94* 2.10*  --  1.39* 1.54*  FERRITIN 158  --  81 337* 384*  LDH 316*  --   --  463*  --   CRP 19.8*  --  21.3* 19.4* 13.8*    Lab Results  Component Value Date   SARSCOV2NAA RESULT:  NEGATIVE 09/17/2019    Inflammatory markers are trending down.   Continue medical therapy with Remdesivir #4/5 (AST 85 and ALT 41), systemic corticosteroids with dexamethasone 6 mg IV q 24. On antitussive agents, bronchodilators and airway clearing techniques with flutter valve and incentive spirometer. Continue to encourage prone position. Out of bed to chair tid with meal.   No signs of bacterial pneumonia will discontinue antibiotic therapy for now.   Patient continue to be at a high risk for worsening hypoxic respiratory failure.   2. Acute on chronic diastolic heart failure. Today patient euvolemic, will hold on further diuresis for now. Will continue blood pressure control. Echocardiogram with preserved LV systolic function EF 45 to 50% with no wall motion abnormalities, ruled out acute coronary syndrome.  3. AKI on CKD stage 2. (base cr [id number] complicated with hypomagnesemia and non gap metabolic acidosis.  Renal function today with serum cr at 1,56 with K at 3,5 and serum bicarbonate at 21. Will continue to follow on renal panel in am, avoid hypotension and nephrotoxic medications. Hold on furosemide for now.   4. Uncontrolled HTN with CAD/ dyslipidemia. Will continue blood pressure control with amlodipine, will resume losartan and  labetalol. On as needed hydralazine. Continue with aspirin and rosuvastatin/ fenofibrate.   5. Nose skin cancer. Sutures in place, look clean, no local inflammation, will refer to outpatient for now. I would like to avoid any possible local bleeding, that can affect capacity to deliver supplemental oxygen.   DVT prophylaxis: enoxaparin   Code Status:  full Family Communication: no family at the beside  Disposition Plan/ discharge barriers: pending clinical improvement.   Body mass index is 27.91 kg/m. Malnutrition Type:      Malnutrition Characteristics:      Nutrition Interventions:     RN Pressure Injury Documentation:     Consultants:   Cardiology   Procedures:     Antimicrobials:       Subjective: Patient with mild improvement in his symptoms compared to yesterday, continue ot have significant dyspnea and persistent cough, no nausea or vomiting, no chest pain.   Objective: Vitals:   10/11/19 1952 10/11/19 2146 10/12/19 0440 10/12/19 0758  BP: (!) 175/67 (!) 182/77 (!) 189/83 (!) 190/70  Pulse: 96  91 94  Resp: 20  18 20   Temp: 98.7 F (37.1 C)  98.9 F (37.2 C) (!) 97.4 F (36.3 C)  TempSrc: Oral  Oral Oral  SpO2: 92%  94% 90%  Weight:      Height:        Intake/Output Summary (Last 24 hours) at 10/12/2019 0837 Last data filed at 10/12/2019 0500 Gross per 24 hour  Intake 1997.24 ml  Output 1250 ml  Net 747.24 ml   Filed Weights   10/09/19 1900  Weight: 85.7 kg    Examination:   General: positive dyspnea, deconditioned and ill looking appearing.  Neurology: Awake and alert, non focal  E ENT: mild pallor, no icterus, oral mucosa moist. Clean sutures at the tip of the nose.  Cardiovascular: No JVD. S1-S2 present. No lower extremity edema. Pulmonary: positive breath sounds bilaterally, adequate air movement, no wheezing, rhonchi or rales. Gastrointestinal. Abdomen with, no organomegaly, non tender, no rebound or guarding Skin. No rashes  Musculoskeletal: no joint deformities     Data Reviewed: I have personally reviewed following labs and imaging studies  CBC: Recent Labs  Lab 10/09/19 1407 10/09/19 2007 10/10/19 0910 10/11/19 0326 10/12/19 0355  WBC 8.7  --  9.0 13.7* 10.3  NEUTROABS 7.1  --  7.3 11.4* 8.8*  HGB 11.7* 11.6* 10.9* 12.3* 12.1*  HCT 37.6* 34.0* 35.0* 38.8* 38.6*  MCV 80.9  --  79.7* 80.2 78.3*  PLT 243  --  246 314 [id number]   Basic Metabolic Panel: Recent Labs  Lab 10/09/19 1407 10/09/19 2007 10/10/19 0910 10/11/19 0326 10/12/19 0355  NA 132* 132* 136 137 141  K 3.8 3.9 3.7 3.9 3.5  CL 102  --  106 107 106  CO2 18*  --  19* 17* 21*  GLUCOSE 152*  --  147* 167* 190*  BUN 24*  --  32* 36* 35*  CREATININE 1.97*  --  1.99* 1.80* 1.56*  CALCIUM 9.3  --  8.4* 8.6* 8.4*  MG  --   --  1.7 2.4 2.0  PHOS  --   --  4.2  3.1 2.3*   GFR: Estimated Creatinine Clearance: 45.1 mL/min (A) (by C-G formula based on SCr of 1.56 mg/dL (H)). Liver Function Tests: Recent Labs  Lab 10/09/19 1407 10/10/19 0910 10/11/19 0326 10/12/19 0355  AST 55* 70* 89* 85*  ALT 21 26 33 41  ALKPHOS 49 38 51 67  BILITOT 0.9 0.4 0.4 0.6  PROT 7.2 6.0* 6.9 6.8  ALBUMIN 3.1* 2.4* 2.7* 2.6*   No results for input(s): LIPASE, AMYLASE in the last 168 hours. No results for input(s): AMMONIA in the last 168 hours. Coagulation Profile: No results for input(s): INR, PROTIME in the last 168 hours. Cardiac Enzymes: No results for input(s): CKTOTAL, CKMB, CKMBINDEX, TROPONINI in the last 168 hours. BNP (last 3 results) No results for input(s): PROBNP in the last 8760 hours. HbA1C: Recent Labs    10/09/19 1741  HGBA1C 7.2*   CBG: Recent Labs  Lab 10/11/19 0041 10/11/19 0814 10/11/19 1207 10/11/19 2110 10/12/19 0723  GLUCAP 158* 139* 259* 199* 201*   Lipid Profile: No results for input(s): CHOL, HDL, LDLCALC, TRIG, CHOLHDL, LDLDIRECT in the last 72 hours. Thyroid Function Tests: No results for input(s): TSH,  T4TOTAL, FREET4, T3FREE, THYROIDAB in the last 72 hours. Anemia Panel: Recent Labs    10/11/19 0326 10/12/19 0355  FERRITIN 337* 384*      Radiology Studies: I have reviewed all of the imaging during this hospital visit personally     Scheduled Meds: . sodium chloride   Intravenous Once  . amLODipine  10 mg Oral Daily  . aspirin EC  81 mg Oral Daily  . cholecalciferol  1,000 Units Oral Daily  . dexamethasone (DECADRON) injection  6 mg Intravenous Q24H  . docusate sodium  100 mg Oral BID  . enoxaparin (LOVENOX) injection  40 mg Subcutaneous Q24H  . famotidine  40 mg Oral BID  . feeding supplement (ENSURE ENLIVE)  237 mL Oral BID BM  . fenofibrate  160 mg Oral Daily  . hydrALAZINE  10 mg Oral Q8H  . insulin aspart  0-15 Units Subcutaneous TID WC  . insulin aspart  0-5 Units Subcutaneous QHS  . ipratropium  2 puff Inhalation Q8H  . levalbuterol  2 puff Inhalation Q8H  . rosuvastatin  20 mg Oral Daily  . sodium chloride flush  3 mL Intravenous Q12H  . sodium chloride flush  3 mL Intravenous Q12H  . vitamin C  500 mg Oral Daily  . zinc sulfate  220 mg Oral Daily   Continuous Infusions: . sodium chloride    . ceFEPime (MAXIPIME) IV 2 g (10/11/19 2142)  . remdesivir 100 mg in NS 100 mL Stopped (10/11/19 1146)     LOS: 3 days        Kalyiah Saintil Gerome Apley, MD

## 2019-10-12 NOTE — Progress Notes (Signed)
Pt stayed between 12-13 L HFNC today. Sat up in his chair for the majority of the day and marched in place a couple of times today. Remdesivir and Plasma both given today. No s/s of distress at this, will cont to monitor.

## 2019-10-13 DIAGNOSIS — E669 Obesity, unspecified: Secondary | ICD-10-CM

## 2019-10-13 DIAGNOSIS — J1289 Other viral pneumonia: Secondary | ICD-10-CM

## 2019-10-13 DIAGNOSIS — I10 Essential (primary) hypertension: Secondary | ICD-10-CM

## 2019-10-13 DIAGNOSIS — E1169 Type 2 diabetes mellitus with other specified complication: Secondary | ICD-10-CM

## 2019-10-13 LAB — PREPARE FRESH FROZEN PLASMA

## 2019-10-13 LAB — COMPREHENSIVE METABOLIC PANEL
ALT: 42 U/L (ref 0–44)
AST: 72 U/L — ABNORMAL HIGH (ref 15–41)
Albumin: 2.6 g/dL — ABNORMAL LOW (ref 3.5–5.0)
Alkaline Phosphatase: 78 U/L (ref 38–126)
Anion gap: 11 (ref 5–15)
BUN: 44 mg/dL — ABNORMAL HIGH (ref 8–23)
CO2: 24 mmol/L (ref 22–32)
Calcium: 8.3 mg/dL — ABNORMAL LOW (ref 8.9–10.3)
Chloride: 104 mmol/L (ref 98–111)
Creatinine, Ser: 1.39 mg/dL — ABNORMAL HIGH (ref 0.61–1.24)
GFR calc Af Amer: 57 mL/min — ABNORMAL LOW (ref >60)
GFR calc non Af Amer: 50 mL/min — ABNORMAL LOW (ref >60)
Glucose, Bld: 177 mg/dL — ABNORMAL HIGH (ref 70–99)
Potassium: 3.5 mmol/L (ref 3.5–5.1)
Sodium: 139 mmol/L (ref 135–145)
Total Bilirubin: 1 mg/dL (ref 0.3–1.2)
Total Protein: 6.2 g/dL — ABNORMAL LOW (ref 6.5–8.1)

## 2019-10-13 LAB — CBC WITH DIFFERENTIAL/PLATELET
Abs Immature Granulocytes: 0.06 10*3/uL (ref 0.00–0.07)
Basophils Absolute: 0 10*3/uL (ref 0.0–0.1)
Basophils Relative: 0 %
Eosinophils Absolute: 0 10*3/uL (ref 0.0–0.5)
Eosinophils Relative: 0 %
HCT: 35.7 % — ABNORMAL LOW (ref 39.0–52.0)
Hemoglobin: 11.2 g/dL — ABNORMAL LOW (ref 13.0–17.0)
Immature Granulocytes: 1 %
Lymphocytes Relative: 7 %
Lymphs Abs: 0.8 10*3/uL (ref 0.7–4.0)
MCH: 24.6 pg — ABNORMAL LOW (ref 26.0–34.0)
MCHC: 31.4 g/dL (ref 30.0–36.0)
MCV: 78.3 fL — ABNORMAL LOW (ref 80.0–100.0)
Monocytes Absolute: 0.8 10*3/uL (ref 0.1–1.0)
Monocytes Relative: 8 %
Neutro Abs: 8.6 10*3/uL — ABNORMAL HIGH (ref 1.7–7.7)
Neutrophils Relative %: 84 %
Platelets: 331 10*3/uL (ref 150–400)
RBC: 4.56 MIL/uL (ref 4.22–5.81)
RDW: 16.7 % — ABNORMAL HIGH (ref 11.5–15.5)
WBC: 10.3 10*3/uL (ref 4.0–10.5)
nRBC: 0 % (ref 0.0–0.2)

## 2019-10-13 LAB — D-DIMER, QUANTITATIVE: D-Dimer, Quant: 1.68 ug/mL-FEU — ABNORMAL HIGH (ref 0.00–0.50)

## 2019-10-13 LAB — MAGNESIUM: Magnesium: 2.2 mg/dL (ref 1.7–2.4)

## 2019-10-13 LAB — GLUCOSE, CAPILLARY
Glucose-Capillary: 195 mg/dL — ABNORMAL HIGH (ref 70–99)
Glucose-Capillary: 226 mg/dL — ABNORMAL HIGH (ref 70–99)
Glucose-Capillary: 330 mg/dL — ABNORMAL HIGH (ref 70–99)
Glucose-Capillary: 384 mg/dL — ABNORMAL HIGH (ref 70–99)

## 2019-10-13 LAB — BPAM FFP
Blood Product Expiration Date: 202012090905
ISSUE DATE / TIME: 202012080908
Unit Type and Rh: 6200

## 2019-10-13 LAB — C-REACTIVE PROTEIN: CRP: 8.4 mg/dL — ABNORMAL HIGH (ref <1.0)

## 2019-10-13 LAB — PHOSPHORUS: Phosphorus: 2.6 mg/dL (ref 2.5–4.6)

## 2019-10-13 LAB — FERRITIN: Ferritin: 369 ng/mL — ABNORMAL HIGH (ref 24–336)

## 2019-10-13 LAB — HEPATITIS B SURFACE ANTIBODY, QUANTITATIVE: Hep B S AB Quant (Post): <3.1 m[IU]/mL — ABNORMAL LOW (ref >9.9)

## 2019-10-13 MED ORDER — GLUCERNA SHAKE PO LIQD
237.0000 mL | Freq: Three times a day (TID) | ORAL | Status: DC
  Administered 2019-10-13 – 2019-10-23 (×25): 237 mL via ORAL
  Filled 2019-10-13 (×33): qty 237

## 2019-10-13 MED ORDER — FUROSEMIDE 10 MG/ML IJ SOLN
80.0000 mg | Freq: Once | INTRAMUSCULAR | Status: AC
  Administered 2019-10-13: 11:00:00 80 mg via INTRAVENOUS
  Filled 2019-10-13: qty 8

## 2019-10-13 MED ORDER — IPRATROPIUM-ALBUTEROL 20-100 MCG/ACT IN AERS
4.0000 | INHALATION_SPRAY | Freq: Four times a day (QID) | RESPIRATORY_TRACT | Status: DC
  Administered 2019-10-13 – 2019-10-23 (×41): 4 via RESPIRATORY_TRACT
  Filled 2019-10-13 (×3): qty 4

## 2019-10-13 MED ORDER — POTASSIUM CHLORIDE CRYS ER 20 MEQ PO TBCR
40.0000 meq | EXTENDED_RELEASE_TABLET | Freq: Once | ORAL | Status: AC
  Administered 2019-10-13: 11:00:00 40 meq via ORAL
  Filled 2019-10-13: qty 2

## 2019-10-13 NOTE — Progress Notes (Addendum)
PROGRESS NOTE    Matthew Bates  [id number] DOB: 01/09/1946 DOA: 10/09/2019 PCP: Marton Redwood, MD    Brief Narrative:  73 year old male who presented with dyspnea.  He does have significant past medical history for hypertension, dyslipidemia, type 2 diabetes mellitus, coronary artery disease and chronic kidney disease.  Patient presented with shortness of breath.  Noted to be hypoxic.  He was positive for COVID-19.  He was hospitalized for further management.  Chest x-ray showed interstitial infiltrates.      Assessment & Plan:  Acute hypoxic respiratory failure due to SARS COVID-19 viral pneumonia.   COVID-19 Labs  Recent Labs    10/11/19 0326 10/12/19 0355 10/13/19 0205  DDIMER 1.39* 1.54* 1.68*  FERRITIN 337* 384* 369*  LDH 463*  --   --   CRP 19.4* 13.8* 8.4*   Positive COVID-19 test result from 10/09/2019.  Patient continues to have tenuous respiratory status.  He is still requiring high amounts of oxygen.  He has been given Actemra and convalescent plasma.  He will complete course of his remdesivir today.  He remains on steroids.  We will give him a dose of Lasix today to see if it helps.  Continue to monitor closely.  Incentive spirometry and mobilization as much as possible.  CRP has been improving, 8.4 from 19.4 two days ago.  Chest x-ray from 12/7 showed significant bilateral lung disease.  Will repeat chest x-ray tomorrow.   Acute on chronic diastolic heart failure Echocardiogram with preserved LV systolic function EF 45 to 50% with no wall motion abnormalities.  Some concern for fluid overload.  We will give him Lasix today.  Monitor ins and outs.  Daily weights.  AKI on CKD stage 2 Baseline creatinine around 1.5.  Monitor renal function closely while he is getting diuresed.  Creatinine is 1.39 this morning.  Potassium 3.5.  Diabetes mellitus type 2, uncontrolled with hyperglycemia HbA1c 7.2.  Elevated CBGs most likely due to steroids.  Patient on Metformin at  home which is being held.  Currently on SSI.  Consider long-acting agent depending on CBGs.  Essential hypertension Monitor blood pressures closely.  Continue with current antihypertensives which include amlodipine, chlorthalidone and losartan.    Coronary artery disease and dyslipidemia Stable.  Continue current medications.  No chest pain.  Continue aspirin.    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  Can go back to his outpatient providers for further management.    DVT prophylaxis: enoxaparin  Code Status:  Full Code Family Communication: We will call his family later today. Disposition Plan/ discharge barriers: pending clinical improvement.    Consultants:   Cardiology   Procedures:  Transthoracic echocardiogram 10/10/2019  1. Left ventricular ejection fraction, by visual estimation, is 45 to 50%. The left ventricle has mildly decreased function. There is no left ventricular hypertrophy.  2. Elevated left ventricular end-diastolic pressure.  3. Left ventricular diastolic parameters are indeterminate.  4. Global right ventricle has mildly reduced systolic function.The right ventricular size is mildly enlarged. Right vetricular wall thickness was not assessed.  5. Left atrial size was not assessed.  6. Right atrial size was not assessed.  7. The mitral valve is normal in structure. Mild mitral valve regurgitation.  8. The tricuspid valve is normal in structure. Tricuspid valve regurgitation is trivial.  9. The aortic valve is abnormal. Aortic valve regurgitation is not visualized. Mild to moderate aortic valve sclerosis/calcification without any evidence of aortic stenosis. 10. The pulmonic valve was grossly normal. Pulmonic valve  regurgitation is mild. 11. Normal pulmonary artery systolic pressure.  Antimicrobials:  Anti-infectives (From admission, onward)   Start     Dose/Rate Route Frequency Ordered Stop   10/12/19 1000  vancomycin (VANCOCIN) IVPB 1000 mg/200 mL premix  Status:   Discontinued     1,000 mg 200 mL/hr over 60 Minutes Intravenous Every 24 hours 10/11/19 0916 10/11/19 1731   10/11/19 1000  ceFEPIme (MAXIPIME) 2 g in sodium chloride 0.9 % 100 mL IVPB  Status:  Discontinued     2 g 200 mL/hr over 30 Minutes Intravenous Every 12 hours 10/11/19 0916 10/12/19 1407   10/11/19 0930  vancomycin (VANCOCIN) 1,750 mg in sodium chloride 0.9 % 500 mL IVPB     1,750 mg 250 mL/hr over 120 Minutes Intravenous  Once 10/11/19 0916 10/11/19 1430   10/10/19 1000  remdesivir 100 mg in sodium chloride 0.9 % 100 mL IVPB     100 mg 200 mL/hr over 30 Minutes Intravenous Daily 10/09/19 1709 10/13/19 1127   10/09/19 1930  cefTRIAXone (ROCEPHIN) 2 g in sodium chloride 0.9 % 100 mL IVPB  Status:  Discontinued     2 g 200 mL/hr over 30 Minutes Intravenous Every 24 hours 10/09/19 1844 10/11/19 0909   10/09/19 1930  azithromycin (ZITHROMAX) 500 mg in sodium chloride 0.9 % 250 mL IVPB  Status:  Discontinued     500 mg 250 mL/hr over 60 Minutes Intravenous Every 24 hours 10/09/19 1844 10/11/19 1731   10/09/19 1800  remdesivir 200 mg in sodium chloride 0.9% 250 mL IVPB     200 mg 580 mL/hr over 30 Minutes Intravenous Once 10/09/19 1709 10/09/19 1838         Subjective: Patient continues to have difficulty breathing.  Denies any chest pain.  No nausea or vomiting.  Occasional dry cough.  No dizziness or lightheadedness.  Objective: Vitals:   10/13/19 0500 10/13/19 0737 10/13/19 0845 10/13/19 1234  BP:  (!) 145/80  134/66  Pulse:  71  62  Resp:  20  (!) 24  Temp: 97.7 F (36.5 C) 97.6 F (36.4 C)  98 F (36.7 C)  TempSrc: Oral Oral  Oral  SpO2:  95% 90% 94%  Weight:      Height:        Intake/Output Summary (Last 24 hours) at 10/13/2019 1353 Last data filed at 10/13/2019 1100 Gross per 24 hour  Intake 1366 ml  Output 1175 ml  Net 191 ml   Filed Weights   10/09/19 1900  Weight: 85.7 kg    Examination:   General appearance: Awake alert.  In no distress.  Mildly  distracted Resp: Tachypneic at rest.  Coarse breath sound bilaterally with crackles bilaterally.  No wheezing or rhonchi.   Cardio: S1-S2 is normal regular.  No S3-S4.  No rubs murmurs or bruit GI: Abdomen is soft.  Nontender nondistended.  Bowel sounds are present normal.  No masses organomegaly Extremities: No edema.  Full range of motion of lower extremities. Neurologic:  No focal neurological deficits.     Data Reviewed: I have personally reviewed following labs and imaging studies  CBC: Recent Labs  Lab 10/09/19 1407 10/09/19 2007 10/10/19 0910 10/11/19 0326 10/12/19 0355 10/13/19 0205  WBC 8.7  --  9.0 13.7* 10.3 10.3  NEUTROABS 7.1  --  7.3 11.4* 8.8* 8.6*  HGB 11.7* 11.6* 10.9* 12.3* 12.1* 11.2*  HCT 37.6* 34.0* 35.0* 38.8* 38.6* 35.7*  MCV 80.9  --  79.7* 80.2 78.3* 78.3*  PLT 243  --  246 314 347 [id number]   Basic Metabolic Panel: Recent Labs  Lab 10/09/19 1407 10/09/19 2007 10/10/19 0910 10/11/19 0326 10/12/19 0355 10/13/19 0205  NA 132* 132* 136 137 141 139  K 3.8 3.9 3.7 3.9 3.5 3.5  CL 102  --  106 107 106 104  CO2 18*  --  19* 17* 21* 24  GLUCOSE 152*  --  147* 167* 190* 177*  BUN 24*  --  32* 36* 35* 44*  CREATININE 1.97*  --  1.99* 1.80* 1.56* 1.39*  CALCIUM 9.3  --  8.4* 8.6* 8.4* 8.3*  MG  --   --  1.7 2.4 2.0 2.2  PHOS  --   --  4.2 3.1 2.3* 2.6   GFR: Estimated Creatinine Clearance: 50.6 mL/min (A) (by C-G formula based on SCr of 1.39 mg/dL (H)). Liver Function Tests: Recent Labs  Lab 10/09/19 1407 10/10/19 0910 10/11/19 0326 10/12/19 0355 10/13/19 0205  AST 55* 70* 89* 85* 72*  ALT 21 26 33 41 42  ALKPHOS 49 38 51 67 78  BILITOT 0.9 0.4 0.4 0.6 1.0  PROT 7.2 6.0* 6.9 6.8 6.2*  ALBUMIN 3.1* 2.4* 2.7* 2.6* 2.6*   CBG: Recent Labs  Lab 10/12/19 1153 10/12/19 1530 10/12/19 2005 10/13/19 0747 10/13/19 1146  GLUCAP 207* 276* 313* 195* 226*   Anemia Panel: Recent Labs    10/12/19 0355 10/13/19 0205  FERRITIN 384* 369*       Radiology Studies: I have reviewed all of the imaging during this hospital visit personally     Scheduled Meds: . sodium chloride   Intravenous Once  . amLODipine  10 mg Oral Daily  . aspirin EC  81 mg Oral Daily  . cholecalciferol  1,000 Units Oral Daily  . dexamethasone (DECADRON) injection  6 mg Intravenous Q24H  . docusate sodium  100 mg Oral BID  . enoxaparin (LOVENOX) injection  40 mg Subcutaneous Q24H  . famotidine  40 mg Oral BID  . feeding supplement (ENSURE ENLIVE)  237 mL Oral BID BM  . fenofibrate  160 mg Oral Daily  . insulin aspart  0-15 Units Subcutaneous TID WC  . insulin aspart  0-5 Units Subcutaneous QHS  . ipratropium  2 puff Inhalation Q8H  . labetalol  200 mg Oral TID  . levalbuterol  2 puff Inhalation Q8H  . losartan  100 mg Oral Daily  . rosuvastatin  20 mg Oral Daily  . sodium chloride flush  3 mL Intravenous Q12H  . sodium chloride flush  3 mL Intravenous Q12H  . vitamin C  500 mg Oral Daily  . zinc sulfate  220 mg Oral Daily   Continuous Infusions: . sodium chloride       LOS: 4 days    Bonnielee Haff, MD Pager: [email or web address]

## 2019-10-14 ENCOUNTER — Inpatient Hospital Stay (HOSPITAL_COMMUNITY): Payer: Medicare HMO

## 2019-10-14 LAB — CBC WITH DIFFERENTIAL/PLATELET
Abs Immature Granulocytes: 0.12 10*3/uL — ABNORMAL HIGH (ref 0.00–0.07)
Basophils Absolute: 0 10*3/uL (ref 0.0–0.1)
Basophils Relative: 0 %
Eosinophils Absolute: 0 10*3/uL (ref 0.0–0.5)
Eosinophils Relative: 0 %
HCT: 36.9 % — ABNORMAL LOW (ref 39.0–52.0)
Hemoglobin: 11.6 g/dL — ABNORMAL LOW (ref 13.0–17.0)
Immature Granulocytes: 1 %
Lymphocytes Relative: 7 %
Lymphs Abs: 0.9 10*3/uL (ref 0.7–4.0)
MCH: 24.8 pg — ABNORMAL LOW (ref 26.0–34.0)
MCHC: 31.4 g/dL (ref 30.0–36.0)
MCV: 78.8 fL — ABNORMAL LOW (ref 80.0–100.0)
Monocytes Absolute: 0.7 10*3/uL (ref 0.1–1.0)
Monocytes Relative: 6 %
Neutro Abs: 11.1 10*3/uL — ABNORMAL HIGH (ref 1.7–7.7)
Neutrophils Relative %: 86 %
Platelets: 353 10*3/uL (ref 150–400)
RBC: 4.68 MIL/uL (ref 4.22–5.81)
RDW: 16.5 % — ABNORMAL HIGH (ref 11.5–15.5)
WBC: 12.8 10*3/uL — ABNORMAL HIGH (ref 4.0–10.5)
nRBC: 0 % (ref 0.0–0.2)

## 2019-10-14 LAB — C-REACTIVE PROTEIN: CRP: 5.1 mg/dL — ABNORMAL HIGH (ref <1.0)

## 2019-10-14 LAB — COMPREHENSIVE METABOLIC PANEL
ALT: 49 U/L — ABNORMAL HIGH (ref 0–44)
AST: 74 U/L — ABNORMAL HIGH (ref 15–41)
Albumin: 2.5 g/dL — ABNORMAL LOW (ref 3.5–5.0)
Alkaline Phosphatase: 126 U/L (ref 38–126)
Anion gap: 14 (ref 5–15)
BUN: 46 mg/dL — ABNORMAL HIGH (ref 8–23)
CO2: 23 mmol/L (ref 22–32)
Calcium: 8.8 mg/dL — ABNORMAL LOW (ref 8.9–10.3)
Chloride: 102 mmol/L (ref 98–111)
Creatinine, Ser: 1.6 mg/dL — ABNORMAL HIGH (ref 0.61–1.24)
GFR calc Af Amer: 48 mL/min — ABNORMAL LOW (ref >60)
GFR calc non Af Amer: 42 mL/min — ABNORMAL LOW (ref >60)
Glucose, Bld: 182 mg/dL — ABNORMAL HIGH (ref 70–99)
Potassium: 3.6 mmol/L (ref 3.5–5.1)
Sodium: 139 mmol/L (ref 135–145)
Total Bilirubin: 0.6 mg/dL (ref 0.3–1.2)
Total Protein: 6.3 g/dL — ABNORMAL LOW (ref 6.5–8.1)

## 2019-10-14 LAB — PHOSPHORUS: Phosphorus: 2.5 mg/dL (ref 2.5–4.6)

## 2019-10-14 LAB — GLUCOSE, CAPILLARY
Glucose-Capillary: 166 mg/dL — ABNORMAL HIGH (ref 70–99)
Glucose-Capillary: 290 mg/dL — ABNORMAL HIGH (ref 70–99)
Glucose-Capillary: 330 mg/dL — ABNORMAL HIGH (ref 70–99)

## 2019-10-14 LAB — D-DIMER, QUANTITATIVE: D-Dimer, Quant: 8.02 ug/mL-FEU — ABNORMAL HIGH (ref 0.00–0.50)

## 2019-10-14 LAB — MAGNESIUM: Magnesium: 2.1 mg/dL (ref 1.7–2.4)

## 2019-10-14 LAB — FERRITIN: Ferritin: 385 ng/mL — ABNORMAL HIGH (ref 24–336)

## 2019-10-14 MED ORDER — LABETALOL HCL 100 MG PO TABS
100.0000 mg | ORAL_TABLET | Freq: Two times a day (BID) | ORAL | Status: DC
  Administered 2019-10-14 – 2019-10-21 (×15): 100 mg via ORAL
  Filled 2019-10-14 (×15): qty 1

## 2019-10-14 MED ORDER — AMLODIPINE BESYLATE 5 MG PO TABS
5.0000 mg | ORAL_TABLET | Freq: Every day | ORAL | Status: DC
  Administered 2019-10-14 – 2019-10-18 (×5): 5 mg via ORAL
  Filled 2019-10-14 (×5): qty 1

## 2019-10-14 MED ORDER — INSULIN DETEMIR 100 UNIT/ML ~~LOC~~ SOLN
8.0000 [IU] | Freq: Every day | SUBCUTANEOUS | Status: DC
  Administered 2019-10-14 – 2019-10-16 (×3): 8 [IU] via SUBCUTANEOUS
  Filled 2019-10-14 (×3): qty 0.08

## 2019-10-14 MED ORDER — ENOXAPARIN SODIUM 40 MG/0.4ML ~~LOC~~ SOLN
40.0000 mg | Freq: Two times a day (BID) | SUBCUTANEOUS | Status: DC
  Administered 2019-10-14 – 2019-10-15 (×3): 40 mg via SUBCUTANEOUS
  Filled 2019-10-14 (×3): qty 0.4

## 2019-10-14 MED ORDER — FUROSEMIDE 10 MG/ML IJ SOLN
80.0000 mg | Freq: Once | INTRAMUSCULAR | Status: AC
  Administered 2019-10-14: 80 mg via INTRAVENOUS
  Filled 2019-10-14: qty 8

## 2019-10-14 NOTE — Progress Notes (Signed)
Attempted to contact spouse to update on pt status and plan of care. No answer at this time.

## 2019-10-14 NOTE — Progress Notes (Signed)
Inpatient Diabetes Program Recommendations  AACE/ADA: New Consensus Statement on Inpatient Glycemic Control (2015)  Target Ranges:  Prepandial:   less than 140 mg/dL      Peak postprandial:   less than 180 mg/dL (1-2 hours)      Critically ill patients:  140 - 180 mg/dL   Lab Results  Component Value Date   GLUCAP 166 (H) 10/14/2019   HGBA1C 7.2 (H) 10/09/2019    Review of Glycemic Control Results for SHLOMO, BRYS (MRN [id number]) as of 10/14/2019 10:15  Ref. Range 10/13/2019 11:46 10/13/2019 16:10 10/13/2019 20:28 10/14/2019 07:38  Glucose-Capillary Latest Ref Range: 70 - 99 mg/dL 226 (H) 330 (H) 384 (H) 166 (H)   Diabetes history: type 2 dm Outpatient Diabetes medications: metformin 1000 mg bid Current orders for Inpatient glycemic control: Novolog 0-15 units tid, novolog 0-5 units qhs Decadron 6 mg qd Inpatient Diabetes Program Recommendations:    Consider adding Levemir 8 units Qd.  Thanks, Bronson Curb, MSN, RNC-OB Diabetes Coordinator [phone] (8a-5p)

## 2019-10-14 NOTE — Progress Notes (Signed)
PROGRESS NOTE    Matthew Bates  [id number] DOB: August 02, 1946 DOA: 10/09/2019 PCP: Marton Redwood, MD    Brief Narrative:  73 year old male who presented with dyspnea.  He does have significant past medical history for hypertension, dyslipidemia, type 2 diabetes mellitus, coronary artery disease and chronic kidney disease.  Patient presented with shortness of breath.  Noted to be hypoxic.  He was positive for COVID-19.  He was hospitalized for further management.  Chest x-ray showed interstitial infiltrates.      Assessment & Plan:  Acute hypoxic respiratory failure due to SARS COVID-19 viral pneumonia.   COVID-19 Labs  Recent Labs    10/12/19 0355 10/13/19 0205 10/14/19 0200  DDIMER 1.54* 1.68* 8.02*  FERRITIN 384* 369* 385*  CRP 13.8* 8.4* 5.1*   Positive COVID-19 test result from 10/09/2019.  Patient remains tenuous from a respiratory standpoint.  He is however feeling slightly better today compared to yesterday.  He is now down to 10 L of oxygen by high flow nasal cannula.  He has been given Actemra and convalescent plasma.  He has completed course of remdesivir.  He remains on steroids.  He was given a dose of Lasix yesterday with improvement in symptoms and good diuresis.  Additional dose to be given today.  Elevated creatinine is noted.  Continue incentive spirometry mobilization.  CRP is down to 5.1.  D-dimer noted to be significantly higher today at 8.02.  Due to elevated creatinine cannot do CT angiogram.  However he does not have any symptoms currently suggestive of PE.  We will do at least a lower extremity venous Doppler study.  Hest x-ray done this morning shows stable to slightly improved bilateral infiltrates.  Acute on chronic diastolic heart failure Echocardiogram with preserved LV systolic function EF 45 to 50% with no wall motion abnormalities.  Additional dose of Lasix to be given today.  Monitor ins and outs and daily weights.    AKI on CKD stage 2 Baseline  creatinine around 1.5.  Slight increase in creatinine noted this morning.  Monitor urine output.  Monitor labs while he is getting diuretics.  Give potassium.  Diabetes mellitus type 2, uncontrolled with hyperglycemia HbA1c 7.2.  Elevated CBGs most likely due to steroids.  Patient on Metformin at home which is being held.  Currently on SSI.  We will start him on Levemir.  Monitor CBGs.  Essential hypertension Blood pressure is reasonably well controlled.  Occasional low readings noted.  Patient mentions that he is not getting some of the high blood pressure medications at the doses that he takes at home.  Amlodipine and labetalol dose decreased.  Hold his ARB for now.    Coronary artery disease and dyslipidemia Stable.  Continue current medications.  No chest pain.  Continue aspirin.    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  Can go back to his outpatient providers for further management.    Microcytic anemia Outpatient work-up.  No overt blood loss noted in the hospital.  DVT prophylaxis: Enoxaparin Code Status:  Full Code Family Communication: Discussed with his wife yesterday.  Will call her again today. Disposition Plan: Wait on further improvement.  Mobilize.  PT and OT evaluation.   Consultants:   Cardiology has signed off  Procedures:  Transthoracic echocardiogram 10/10/2019  1. Left ventricular ejection fraction, by visual estimation, is 45 to 50%. The left ventricle has mildly decreased function. There is no left ventricular hypertrophy.  2. Elevated left ventricular end-diastolic pressure.  3. Left ventricular  diastolic parameters are indeterminate.  4. Global right ventricle has mildly reduced systolic function.The right ventricular size is mildly enlarged. Right vetricular wall thickness was not assessed.  5. Left atrial size was not assessed.  6. Right atrial size was not assessed.  7. The mitral valve is normal in structure. Mild mitral valve regurgitation.  8. The  tricuspid valve is normal in structure. Tricuspid valve regurgitation is trivial.  9. The aortic valve is abnormal. Aortic valve regurgitation is not visualized. Mild to moderate aortic valve sclerosis/calcification without any evidence of aortic stenosis. 10. The pulmonic valve was grossly normal. Pulmonic valve regurgitation is mild. 11. Normal pulmonary artery systolic pressure.  Antimicrobials:  Anti-infectives (From admission, onward)   Start     Dose/Rate Route Frequency Ordered Stop   10/12/19 1000  vancomycin (VANCOCIN) IVPB 1000 mg/200 mL premix  Status:  Discontinued     1,000 mg 200 mL/hr over 60 Minutes Intravenous Every 24 hours 10/11/19 0916 10/11/19 1731   10/11/19 1000  ceFEPIme (MAXIPIME) 2 g in sodium chloride 0.9 % 100 mL IVPB  Status:  Discontinued     2 g 200 mL/hr over 30 Minutes Intravenous Every 12 hours 10/11/19 0916 10/12/19 1407   10/11/19 0930  vancomycin (VANCOCIN) 1,750 mg in sodium chloride 0.9 % 500 mL IVPB     1,750 mg 250 mL/hr over 120 Minutes Intravenous  Once 10/11/19 0916 10/11/19 1430   10/10/19 1000  remdesivir 100 mg in sodium chloride 0.9 % 100 mL IVPB     100 mg 200 mL/hr over 30 Minutes Intravenous Daily 10/09/19 1709 10/13/19 1127   10/09/19 1930  cefTRIAXone (ROCEPHIN) 2 g in sodium chloride 0.9 % 100 mL IVPB  Status:  Discontinued     2 g 200 mL/hr over 30 Minutes Intravenous Every 24 hours 10/09/19 1844 10/11/19 0909   10/09/19 1930  azithromycin (ZITHROMAX) 500 mg in sodium chloride 0.9 % 250 mL IVPB  Status:  Discontinued     500 mg 250 mL/hr over 60 Minutes Intravenous Every 24 hours 10/09/19 1844 10/11/19 1731   10/09/19 1800  remdesivir 200 mg in sodium chloride 0.9% 250 mL IVPB     200 mg 580 mL/hr over 30 Minutes Intravenous Once 10/09/19 1709 10/09/19 1838         Subjective: Patient mentions that he feels slightly better today compared to yesterday.  Less short of breath.  Denies chest pain.  No nausea vomiting.     Objective: Vitals:   10/14/19 0300 10/14/19 0337 10/14/19 0346 10/14/19 0815  BP:   105/71 (!) 151/63  Pulse: (!) 58 72 62 66  Resp: 18 (!) 27 (!) 22 20  Temp:   97.6 F (36.4 C) 97.9 F (36.6 C)  TempSrc:   Oral Oral  SpO2: 99% 92% 93% 92%  Weight:      Height:        Intake/Output Summary (Last 24 hours) at 10/14/2019 1217 Last data filed at 10/13/2019 2231 Gross per 24 hour  Intake 960 ml  Output 1475 ml  Net -515 ml   Filed Weights   10/09/19 1900  Weight: 85.7 kg    Examination:   General appearance: Awake alert.  In no distress Resp: Less tachypneic today compared to yesterday.  Coarse breath sounds bilaterally with few crackles at the bases.  No wheezing or rhonchi Cardio: S1-S2 is normal regular.  No S3-S4.  No rubs murmurs or bruit GI: Abdomen is soft.  Nontender nondistended.  Bowel  sounds are present normal.  No masses organomegaly Extremities: No edema.  Full range of motion of lower extremities. Neurologic:  No focal neurological deficits.     Data Reviewed: I have personally reviewed following labs and imaging studies  CBC: Recent Labs  Lab 10/10/19 0910 10/11/19 0326 10/12/19 0355 10/13/19 0205 10/14/19 0200  WBC 9.0 13.7* 10.3 10.3 12.8*  NEUTROABS 7.3 11.4* 8.8* 8.6* 11.1*  HGB 10.9* 12.3* 12.1* 11.2* 11.6*  HCT 35.0* 38.8* 38.6* 35.7* 36.9*  MCV 79.7* 80.2 78.3* 78.3* 78.8*  PLT 246 314 347 331 0000000   Basic Metabolic Panel: Recent Labs  Lab 10/10/19 0910 10/11/19 0326 10/12/19 0355 10/13/19 0205 10/14/19 0200  NA 136 137 141 139 139  K 3.7 3.9 3.5 3.5 3.6  CL 106 107 106 104 102  CO2 19* 17* 21* 24 23  GLUCOSE 147* 167* 190* 177* 182*  BUN 32* 36* 35* 44* 46*  CREATININE 1.99* 1.80* 1.56* 1.39* 1.60*  CALCIUM 8.4* 8.6* 8.4* 8.3* 8.8*  MG 1.7 2.4 2.0 2.2 2.1  PHOS 4.2 3.1 2.3* 2.6 2.5   GFR: Estimated Creatinine Clearance: 43.9 mL/min (A) (by C-G formula based on SCr of 1.6 mg/dL (H)). Liver Function Tests: Recent Labs  Lab  10/10/19 0910 10/11/19 0326 10/12/19 0355 10/13/19 0205 10/14/19 0200  AST 70* 89* 85* 72* 74*  ALT 26 33 41 42 49*  ALKPHOS 38 51 67 78 126  BILITOT 0.4 0.4 0.6 1.0 0.6  PROT 6.0* 6.9 6.8 6.2* 6.3*  ALBUMIN 2.4* 2.7* 2.6* 2.6* 2.5*   CBG: Recent Labs  Lab 10/13/19 1146 10/13/19 1610 10/13/19 2028 10/14/19 0738 10/14/19 1158  GLUCAP 226* 330* 384* 166* 290*   Anemia Panel: Recent Labs    10/13/19 0205 10/14/19 0200  FERRITIN 369* 385*      Radiology Studies: DG CHEST PORT 1 VIEW  Result Date: 10/14/2019 CLINICAL DATA:  Pneumonia. EXAM: PORTABLE CHEST 1 VIEW COMPARISON:  October 11, 2019. FINDINGS: Stable cardiomediastinal silhouette. Status post coronary bypass graft. No pneumothorax or pleural effusion is noted. Stable bilateral lung opacities are noted. Bony thorax is unremarkable. IMPRESSION: Stable bilateral lung opacities are noted concerning for multifocal pneumonia. Electronically Signed   By: Marijo Conception M.D.   On: 10/14/2019 08:11   DG CHEST PORT 1 VIEW  Result Date: 10/11/2019 CLINICAL DATA:  Hypoxia. EXAM: PORTABLE CHEST 1 VIEW COMPARISON:  10/09/2019. FINDINGS: Prior CABG. Cardiomegaly. Progressive severe diffuse bilateral pulmonary infiltrates/edema. No pleural effusion or pneumothorax. No acute bony abnormality. IMPRESSION: 1. Progressive severe diffuse bilateral pulmonary infiltrates/edema. 2.  Prior CABG.  Stable cardiomegaly. Electronically Signed   By: Marcello Moores  Register   On: 10/11/2019 07:55        Scheduled Meds: . sodium chloride   Intravenous Once  . amLODipine  5 mg Oral Daily  . aspirin EC  81 mg Oral Daily  . cholecalciferol  1,000 Units Oral Daily  . dexamethasone (DECADRON) injection  6 mg Intravenous Q24H  . docusate sodium  100 mg Oral BID  . enoxaparin (LOVENOX) injection  40 mg Subcutaneous Q12H  . famotidine  40 mg Oral BID  . feeding supplement (GLUCERNA SHAKE)  237 mL Oral TID BM  . fenofibrate  160 mg Oral Daily  .  insulin aspart  0-15 Units Subcutaneous TID WC  . insulin aspart  0-5 Units Subcutaneous QHS  . Ipratropium-Albuterol  4 puff Inhalation QID  . labetalol  100 mg Oral BID  . rosuvastatin  20 mg Oral  Daily  . sodium chloride flush  3 mL Intravenous Q12H  . sodium chloride flush  3 mL Intravenous Q12H  . vitamin C  500 mg Oral Daily  . zinc sulfate  220 mg Oral Daily   Continuous Infusions: . sodium chloride       LOS: 5 days    Bonnielee Haff, MD Pager: [email or web address]

## 2019-10-15 ENCOUNTER — Inpatient Hospital Stay (HOSPITAL_COMMUNITY): Payer: Medicare HMO

## 2019-10-15 DIAGNOSIS — M7989 Other specified soft tissue disorders: Secondary | ICD-10-CM

## 2019-10-15 DIAGNOSIS — R7989 Other specified abnormal findings of blood chemistry: Secondary | ICD-10-CM

## 2019-10-15 LAB — BASIC METABOLIC PANEL
Anion gap: 12 (ref 5–15)
BUN: 49 mg/dL — ABNORMAL HIGH (ref 8–23)
CO2: 25 mmol/L (ref 22–32)
Calcium: 8.8 mg/dL — ABNORMAL LOW (ref 8.9–10.3)
Chloride: 100 mmol/L (ref 98–111)
Creatinine, Ser: 1.66 mg/dL — ABNORMAL HIGH (ref 0.61–1.24)
GFR calc Af Amer: 46 mL/min — ABNORMAL LOW (ref >60)
GFR calc non Af Amer: 40 mL/min — ABNORMAL LOW (ref >60)
Glucose, Bld: 239 mg/dL — ABNORMAL HIGH (ref 70–99)
Potassium: 3.6 mmol/L (ref 3.5–5.1)
Sodium: 137 mmol/L (ref 135–145)

## 2019-10-15 LAB — GLUCOSE, CAPILLARY
Glucose-Capillary: 327 mg/dL — ABNORMAL HIGH (ref 70–99)
Glucose-Capillary: 182 mg/dL — ABNORMAL HIGH (ref 70–99)
Glucose-Capillary: 297 mg/dL — ABNORMAL HIGH (ref 70–99)
Glucose-Capillary: 377 mg/dL — ABNORMAL HIGH (ref 70–99)
Glucose-Capillary: 338 mg/dL — ABNORMAL HIGH (ref 70–99)

## 2019-10-15 LAB — D-DIMER, QUANTITATIVE: D-Dimer, Quant: >20 ug/mL-FEU — ABNORMAL HIGH (ref 0.00–0.50)

## 2019-10-15 MED ORDER — FUROSEMIDE 10 MG/ML IJ SOLN
80.0000 mg | Freq: Once | INTRAMUSCULAR | Status: AC
  Administered 2019-10-15: 80 mg via INTRAVENOUS
  Filled 2019-10-15: qty 8

## 2019-10-15 MED ORDER — HEPARIN BOLUS VIA INFUSION
3000.0000 [IU] | Freq: Once | INTRAVENOUS | Status: AC
  Administered 2019-10-15: 3000 [IU] via INTRAVENOUS
  Filled 2019-10-15: qty 3000

## 2019-10-15 MED ORDER — HEPARIN (PORCINE) 25000 UT/250ML-% IV SOLN
1250.0000 [IU]/h | INTRAVENOUS | Status: DC
  Administered 2019-10-15: 19:00:00 1250 [IU]/h via INTRAVENOUS
  Filled 2019-10-15: qty 250

## 2019-10-15 NOTE — Progress Notes (Signed)
RN repsonded to spo2 alarm. Pt O2 sat dropped to 84-85%. HIflow Lantana increased from 10L to 15L at which point O2 sat slowly climbed to 90%. Pt denies shortness of breath; no acute distress noted. MD paged.

## 2019-10-15 NOTE — Evaluation (Signed)
Physical Therapy Evaluation Patient Details Name: Matthew Bates MRN: [id number] DOB: 03/11/1946 Today's Date: 10/15/2019   History of Present Illness  Pt is a 73 y.o. male admitted 10/09/19 with dyspnea. (+) COVID-19. PMH includes HTN, DM2, CAD, CKD.  Clinical Impression  Pt presents with an overall decrease in functional mobility secondary to above. PTA, pt independent, lives with wife, drives and works as Engineer, maintenance ([employer]). Today, pt moving well at supervision-level; SpO2 down to 86% on 14L HFNC with mobility. Educ re: energy conservation, seated/standing therex, importance of mobility, flutter valve/IS use. Pt would benefit from continued acute PT services to maximize functional mobility and independence prior to d/c with HHPT services.     Follow Up Recommendations Home health PT;Supervision for mobility/OOB    Equipment Recommendations  Rolling walker with 5" wheels(TBD-pt may own one)    Recommendations for Other Services       Precautions / Restrictions Precautions Precautions: Fall Restrictions Weight Bearing Restrictions: No      Mobility  Bed Mobility               General bed mobility comments: Received sitting in recliner  Transfers Overall transfer level: Needs assistance Equipment used: Rolling walker (2 wheeled) Transfers: Sit to/from Stand Sit to Stand: Supervision         General transfer comment: Multiple sit<>stands from recliner and BSC to RW, using momentum; supervision for safety. Supervision transfer from recliner<>BSC  Ambulation/Gait Ambulation/Gait assistance: Supervision Gait Distance (Feet): 30 Feet Assistive device: Rolling walker (2 wheeled);None Gait Pattern/deviations: Step-through pattern;Decreased stride length   Gait velocity interpretation: 1.31 - 2.62 ft/sec, indicative of limited community ambulator General Gait Details: Amb in room at supervision-level; pt initially using RW, then carrying it but still requesting to hold it for comfort.  SpO2 down to 86% on 14L HFNC  Stairs            Wheelchair Mobility    Modified Rankin (Stroke Patients Only)       Balance Overall balance assessment: Needs assistance   Sitting balance-Leahy Scale: Good       Standing balance-Leahy Scale: Fair                               Pertinent Vitals/Pain Pain Assessment: No/denies pain    Home Living Family/patient expects to be discharged to:: Private residence Living Arrangements: Spouse/significant other Available Help at Discharge: Family;Available 24 hours/day Type of Home: House Home Access: Stairs to enter Entrance Stairs-Rails: Right Entrance Stairs-Number of Steps: 8 Home Layout: One level Home Equipment: Shower seat Additional Comments: May have RW. 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 Drives. Walks for exercise     Hand Dominance        Extremity/Trunk Assessment   Upper Extremity Assessment Upper Extremity Assessment: Overall WFL for tasks assessed    Lower Extremity Assessment Lower Extremity Assessment: Overall WFL for tasks assessed    Cervical / Trunk Assessment Cervical / Trunk Assessment: Normal  Communication   Communication: No difficulties  Cognition Arousal/Alertness: Awake/alert Behavior During Therapy: WFL for tasks assessed/performed Overall Cognitive Status: Within Functional Limits for tasks assessed  General Comments General comments (skin integrity, edema, etc.): Energy conservation handout given    Exercises Other Exercises Other Exercises: 10x incentive spirometer, 5x flutter valve Other Exercises: 5x sit<>stand, marching in place (within confines of lines/O2)   Assessment/Plan    PT Assessment Patient needs continued PT services  PT Problem List  Decreased activity tolerance;Decreased balance;Decreased mobility;Cardiopulmonary status limiting activity       PT Treatment Interventions DME instruction;Gait training;Stair training;Functional mobility training;Therapeutic activities;Therapeutic exercise;Balance training;Patient/family education    PT Goals (Current goals can be found in the Care Plan section)  Acute Rehab PT Goals Patient Stated Goal: Return home and get back to work PT Goal Formulation: With patient Time For Goal Achievement: 10/29/19 Potential to Achieve Goals: Good    Frequency Min 3X/week   Barriers to discharge        Co-evaluation               AM-PAC PT "6 Clicks" Mobility  Outcome Measure Help needed turning from your back to your side while in a flat bed without using bedrails?: None Help needed moving from lying on your back to sitting on the side of a flat bed without using bedrails?: None Help needed moving to and from a bed to a chair (including a wheelchair)?: None Help needed standing up from a chair using your arms (e.g., wheelchair or bedside chair)?: None Help needed to walk in hospital room?: A Little Help needed climbing 3-5 steps with a railing? : A Little 6 Click Score: 22    End of Session Equipment Utilized During Treatment: Oxygen Activity Tolerance: Patient tolerated treatment well Patient left: in chair;with call bell/phone within reach Nurse Communication: Mobility status PT Visit Diagnosis: Other abnormalities of gait and mobility (R26.89)    Time: [id number] PT Time Calculation (min) (ACUTE ONLY): 25 min   Charges:   PT Evaluation $PT Eval Moderate Complexity: 1 Mod PT Treatments $Therapeutic Exercise: 8-22 mins   Mabeline Caras, PT, DPT Acute Rehabilitation Services  Pager [phone] Office [place] 10/15/2019, 12:25 PM

## 2019-10-15 NOTE — Progress Notes (Signed)
Bilateral lower extremity venous duplex completed.  Informed Dr. Maryland Pink and Grayland Ormond, RN of critical results.  10/15/2019 3:44 PM Maudry Mayhew, MHA, RVT, RDCS, RDMS

## 2019-10-15 NOTE — Progress Notes (Signed)
ANTICOAGULATION CONSULT NOTE - Initial Consult  Pharmacy Consult for heparin Indication: DVT  Allergies  Allergen Reactions  . Metoclopramide Hcl Hypertension    Reglan=Increases HBP symptoms    Patient Measurements: Height: 5\' 9"  (175.3 cm) Weight: 189 lb (85.7 kg) IBW/kg (Calculated) : 70.7 Heparin Dosing Weight: 86 kg  Vital Signs: Temp: 97.8 F (36.6 C) (12/11 0814) Temp Source: Oral (12/11 0814) BP: 167/66 (12/11 0814) Pulse Rate: 86 (12/11 0814)  Labs: Recent Labs    10/13/19 0205 10/14/19 0200 10/15/19 0125  HGB 11.2* 11.6*  --   HCT 35.7* 36.9*  --   PLT 331 353  --   CREATININE 1.39* 1.60* 1.66*    Estimated Creatinine Clearance: 42.4 mL/min (A) (by C-G formula based on SCr of 1.66 mg/dL (H)).   Medical History: Past Medical History:  Diagnosis Date  . Anemia   . CAD (coronary artery disease)   . Carotid artery occlusion   . Cataract   . Chronic kidney disease   . DM (diabetes mellitus) ([place])   . GERD (gastroesophageal reflux disease)   . Gout   . Hepatitis    as a child  . High triglycerides   . HTN (hypertension)   . Obesity     Medications:  Scheduled:  . sodium chloride   Intravenous Once  . amLODipine  5 mg Oral Daily  . aspirin EC  81 mg Oral Daily  . cholecalciferol  1,000 Units Oral Daily  . dexamethasone (DECADRON) injection  6 mg Intravenous Q24H  . docusate sodium  100 mg Oral BID  . enoxaparin (LOVENOX) injection  40 mg Subcutaneous Q12H  . famotidine  40 mg Oral BID  . feeding supplement (GLUCERNA SHAKE)  237 mL Oral TID BM  . fenofibrate  160 mg Oral Daily  . insulin aspart  0-15 Units Subcutaneous TID WC  . insulin aspart  0-5 Units Subcutaneous QHS  . insulin detemir  8 Units Subcutaneous Daily  . Ipratropium-Albuterol  4 puff Inhalation QID  . labetalol  100 mg Oral BID  . rosuvastatin  20 mg Oral Daily  . sodium chloride flush  3 mL Intravenous Q12H  . sodium chloride flush  3 mL Intravenous Q12H  . vitamin C  500  mg Oral Daily  . zinc sulfate  220 mg Oral Daily   Infusions:  . sodium chloride      Assessment: Pt was admitted for COVID and has finished his course of remdesivir. He has been on appropriate therapy for his DVT prophylaxis. He developed an acute DVT today after doppler. His d-dimer has been trending up over the past several days. IV heparin has been ordered since his scr is also trending up. He got his prophylaxis dose of lovenox this AM so we will give a smaller bolus today.   Goal of Therapy:  Heparin level 0.3-0.7 units/ml Monitor platelets by anticoagulation protocol: Yes   Plan:   Heparin bolus 3000 units x1 Heparin drip at 1250 units/hr F/u with 8 hr heparin level Daily HL and CBC  Onnie Boer, PharmD, BCIDP, AAHIVP, CPP Infectious Disease Pharmacist 10/15/2019 4:18 PM

## 2019-10-15 NOTE — Progress Notes (Addendum)
PROGRESS NOTE    GUTHRIE CRISP  [id number] DOB: 1946-06-02 DOA: 10/09/2019 PCP: Marton Redwood, MD    Brief Narrative:  73 year old male who presented with dyspnea.  He does have significant past medical history for hypertension, dyslipidemia, type 2 diabetes mellitus, coronary artery disease and chronic kidney disease.  Patient presented with shortness of breath.  Noted to be hypoxic.  He was positive for COVID-19.  He was hospitalized for further management.  Chest x-ray showed interstitial infiltrates.      Assessment & Plan:  Acute hypoxic respiratory failure due to SARS COVID-19 viral pneumonia.   COVID-19 Labs  Recent Labs  Lab 10/09/19 1741 10/09/19 1741 10/10/19 0910 10/10/19 1040 10/11/19 0326 10/12/19 0355 10/13/19 0205 10/14/19 0200 10/15/19 0125  DDIMER 3.94*   < > 2.10*  --  1.39* 1.54* 1.68* 8.02* >20.00*  FERRITIN 158  --   --  81 337* 384* 369* 385*  --   CRP 19.8*  --   --  21.3* 19.4* 13.8* 8.4* 5.1*  --   ALT  --   --  26  --  33 41 42 49*  --   PROCALCITON 0.76  --   --   --  1.01  --   --   --   --    < > = values in this interval not displayed.   Positive COVID-19 test result from 10/09/2019.  Patient's respiratory status remains tenuous.  He continues to require high levels of oxygen.  He is currently on high flow nasal cannula at 12 L/min.  Saturating in the early 90s. His CRP had improved to 5.1.  Ferritin was 385 yesterday.  Patient completed course of remdesivir.  He was also given Actemra and convalescent plasma.  He remains on steroids.  He was given Lasix which will be repeated today.  Continue with incentive spirometry and mobilization. D-dimer noted to be significantly elevated.  Unfortunately patient does have elevated creatinine so it will be hard to do a CT angiogram.  We will proceed with lower extremity Doppler study.  If they are negative and if the patient continues to have high oxygen requirements then may have to consider further  evaluation.  Chest x-ray from 12/10 showed stable findings.  Check procalcitonin tomorrow.  His WBC was elevated yesterday which could be due to steroids.  ADDENDUM LE dopplers revealed acute DVT. Will start IV Heparin. Highly likely that he has PE as well. But no need to evaluate for same due to elevated creatinine and further testing will not change management.  Acute on chronic diastolic heart failure Echocardiogram with preserved LV systolic function EF 45 to 50% with no wall motion abnormalities.  Lasix on a daily basis depending on his volume status.  Monitor ins and outs.  Daily weights.   AKI on CKD stage 2 Baseline creatinine around 1.5.  Renal function close to baseline.  Slight rise in creatinine is likely due to diuretics.  Monitor urine output.  Check labs daily.  Potassium is 3.6.  Diabetes mellitus type 2, uncontrolled with hyperglycemia HbA1c 7.2.  Elevated CBG most likely due to steroids.  Metformin is on hold.  Continue SSI.  Started on Levemir yesterday.   Essential hypertension Blood pressure is noted to be occasionally high.  Continue to monitor.  Continue amlodipine and labetalol.  His ARB is on hold.    Coronary artery disease and dyslipidemia Stable.  Continue current medications.  No chest pain.  Continue aspirin.  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  Can go back to his outpatient providers for further management.    DVT prophylaxis: enoxaparin  Code Status:  Full Code Family Communication: Being updated on a daily basis Disposition Plan: Not ready for discharge due to tenuous respiratory status.     Consultants:   Cardiology   Procedures:  Transthoracic echocardiogram 10/10/2019  1. Left ventricular ejection fraction, by visual estimation, is 45 to 50%. The left ventricle has mildly decreased function. There is no left ventricular hypertrophy.  2. Elevated left ventricular end-diastolic pressure.  3. Left ventricular diastolic parameters are  indeterminate.  4. Global right ventricle has mildly reduced systolic function.The right ventricular size is mildly enlarged. Right vetricular wall thickness was not assessed.  5. Left atrial size was not assessed.  6. Right atrial size was not assessed.  7. The mitral valve is normal in structure. Mild mitral valve regurgitation.  8. The tricuspid valve is normal in structure. Tricuspid valve regurgitation is trivial.  9. The aortic valve is abnormal. Aortic valve regurgitation is not visualized. Mild to moderate aortic valve sclerosis/calcification without any evidence of aortic stenosis. 10. The pulmonic valve was grossly normal. Pulmonic valve regurgitation is mild. 11. Normal pulmonary artery systolic pressure.   Antimicrobials:  Anti-infectives (From admission, onward)   Start     Dose/Rate Route Frequency Ordered Stop   10/12/19 1000  vancomycin (VANCOCIN) IVPB 1000 mg/200 mL premix  Status:  Discontinued     1,000 mg 200 mL/hr over 60 Minutes Intravenous Every 24 hours 10/11/19 0916 10/11/19 1731   10/11/19 1000  ceFEPIme (MAXIPIME) 2 g in sodium chloride 0.9 % 100 mL IVPB  Status:  Discontinued     2 g 200 mL/hr over 30 Minutes Intravenous Every 12 hours 10/11/19 0916 10/12/19 1407   10/11/19 0930  vancomycin (VANCOCIN) 1,750 mg in sodium chloride 0.9 % 500 mL IVPB     1,750 mg 250 mL/hr over 120 Minutes Intravenous  Once 10/11/19 0916 10/11/19 1430   10/10/19 1000  remdesivir 100 mg in sodium chloride 0.9 % 100 mL IVPB     100 mg 200 mL/hr over 30 Minutes Intravenous Daily 10/09/19 1709 10/13/19 1127   10/09/19 1930  cefTRIAXone (ROCEPHIN) 2 g in sodium chloride 0.9 % 100 mL IVPB  Status:  Discontinued     2 g 200 mL/hr over 30 Minutes Intravenous Every 24 hours 10/09/19 1844 10/11/19 0909   10/09/19 1930  azithromycin (ZITHROMAX) 500 mg in sodium chloride 0.9 % 250 mL IVPB  Status:  Discontinued     500 mg 250 mL/hr over 60 Minutes Intravenous Every 24 hours 10/09/19 1844  10/11/19 1731   10/09/19 1800  remdesivir 200 mg in sodium chloride 0.9% 250 mL IVPB     200 mg 580 mL/hr over 30 Minutes Intravenous Once 10/09/19 1709 10/09/19 1838         Subjective: Patient continues to have difficulty breathing however he states that he is feeling better compared to yesterday.  Denies any chest pain.  No nausea vomiting.  Objective: Vitals:   10/15/19 0215 10/15/19 0220 10/15/19 0300 10/15/19 0814  BP:   (!) 156/81 (!) 167/66  Pulse: 64 72 (!) 57 86  Resp: (!) 24 19 17 20   Temp:   (!) 97.5 F (36.4 C) 97.8 F (36.6 C)  TempSrc:   Oral Oral  SpO2: (!) 86% 90% 95% 91%  Weight:      Height:  Intake/Output Summary (Last 24 hours) at 10/15/2019 1205 Last data filed at 10/14/2019 2214 Gross per 24 hour  Intake 3 ml  Output 1500 ml  Net -1497 ml   Filed Weights   10/09/19 1900  Weight: 85.7 kg    Examination:   General appearance: Awake alert.  In no distress Resp: Mildly tachypneic at rest.  Coarse breath sound bilaterally with crackles at the bases.  No wheezing or rhonchi.   Cardio: S1-S2 is normal regular.  No S3-S4.  No rubs murmurs or bruit GI: Abdomen is soft.  Nontender nondistended.  Bowel sounds are present normal.  No masses organomegaly Extremities: No edema.  Full range of motion of lower extremities. Neurologic: Alert and oriented x3.  No focal neurological deficits.     Data Reviewed: I have personally reviewed following labs and imaging studies  CBC: Recent Labs  Lab 10/10/19 0910 10/11/19 0326 10/12/19 0355 10/13/19 0205 10/14/19 0200  WBC 9.0 13.7* 10.3 10.3 12.8*  NEUTROABS 7.3 11.4* 8.8* 8.6* 11.1*  HGB 10.9* 12.3* 12.1* 11.2* 11.6*  HCT 35.0* 38.8* 38.6* 35.7* 36.9*  MCV 79.7* 80.2 78.3* 78.3* 78.8*  PLT 246 314 347 331 0000000   Basic Metabolic Panel: Recent Labs  Lab 10/10/19 0910 10/11/19 0326 10/12/19 0355 10/13/19 0205 10/14/19 0200 10/15/19 0125  NA 136 137 141 139 139 137  K 3.7 3.9 3.5 3.5 3.6  3.6  CL 106 107 106 104 102 100  CO2 19* 17* 21* 24 23 25   GLUCOSE 147* 167* 190* 177* 182* 239*  BUN 32* 36* 35* 44* 46* 49*  CREATININE 1.99* 1.80* 1.56* 1.39* 1.60* 1.66*  CALCIUM 8.4* 8.6* 8.4* 8.3* 8.8* 8.8*  MG 1.7 2.4 2.0 2.2 2.1  --   PHOS 4.2 3.1 2.3* 2.6 2.5  --    GFR: Estimated Creatinine Clearance: 42.4 mL/min (A) (by C-G formula based on SCr of 1.66 mg/dL (H)). Liver Function Tests: Recent Labs  Lab 10/10/19 0910 10/11/19 0326 10/12/19 0355 10/13/19 0205 10/14/19 0200  AST 70* 89* 85* 72* 74*  ALT 26 33 41 42 49*  ALKPHOS 38 51 67 78 126  BILITOT 0.4 0.4 0.6 1.0 0.6  PROT 6.0* 6.9 6.8 6.2* 6.3*  ALBUMIN 2.4* 2.7* 2.6* 2.6* 2.5*   CBG: Recent Labs  Lab 10/14/19 1158 10/14/19 1710 10/14/19 2011 10/15/19 0813 10/15/19 1138  GLUCAP 290* 330* 327* 182* 297*   Anemia Panel: Recent Labs    10/13/19 0205 10/14/19 0200  FERRITIN 369* 385*      Radiology Studies: DG CHEST PORT 1 VIEW  Result Date: 10/14/2019 CLINICAL DATA:  Pneumonia. EXAM: PORTABLE CHEST 1 VIEW COMPARISON:  October 11, 2019. FINDINGS: Stable cardiomediastinal silhouette. Status post coronary bypass graft. No pneumothorax or pleural effusion is noted. Stable bilateral lung opacities are noted. Bony thorax is unremarkable. IMPRESSION: Stable bilateral lung opacities are noted concerning for multifocal pneumonia. Electronically Signed   By: Marijo Conception M.D.   On: 10/14/2019 08:11        Scheduled Meds: . sodium chloride   Intravenous Once  . amLODipine  5 mg Oral Daily  . aspirin EC  81 mg Oral Daily  . cholecalciferol  1,000 Units Oral Daily  . dexamethasone (DECADRON) injection  6 mg Intravenous Q24H  . docusate sodium  100 mg Oral BID  . enoxaparin (LOVENOX) injection  40 mg Subcutaneous Q12H  . famotidine  40 mg Oral BID  . feeding supplement (GLUCERNA SHAKE)  237 mL Oral TID BM  .  fenofibrate  160 mg Oral Daily  . insulin aspart  0-15 Units Subcutaneous TID WC  .  insulin aspart  0-5 Units Subcutaneous QHS  . insulin detemir  8 Units Subcutaneous Daily  . Ipratropium-Albuterol  4 puff Inhalation QID  . labetalol  100 mg Oral BID  . rosuvastatin  20 mg Oral Daily  . sodium chloride flush  3 mL Intravenous Q12H  . sodium chloride flush  3 mL Intravenous Q12H  . vitamin C  500 mg Oral Daily  . zinc sulfate  220 mg Oral Daily   Continuous Infusions: . sodium chloride       LOS: 6 days    Bonnielee Haff, MD Pager: [email or web address]

## 2019-10-16 DIAGNOSIS — R0989 Other specified symptoms and signs involving the circulatory and respiratory systems: Secondary | ICD-10-CM

## 2019-10-16 DIAGNOSIS — I824Y3 Acute embolism and thrombosis of unspecified deep veins of proximal lower extremity, bilateral: Secondary | ICD-10-CM

## 2019-10-16 LAB — BASIC METABOLIC PANEL
Anion gap: 15 (ref 5–15)
BUN: 52 mg/dL — ABNORMAL HIGH (ref 8–23)
CO2: 27 mmol/L (ref 22–32)
Calcium: 9 mg/dL (ref 8.9–10.3)
Chloride: 94 mmol/L — ABNORMAL LOW (ref 98–111)
Creatinine, Ser: 1.71 mg/dL — ABNORMAL HIGH (ref 0.61–1.24)
GFR calc Af Amer: 45 mL/min — ABNORMAL LOW (ref >60)
GFR calc non Af Amer: 39 mL/min — ABNORMAL LOW (ref >60)
Glucose, Bld: 279 mg/dL — ABNORMAL HIGH (ref 70–99)
Potassium: 3.9 mmol/L (ref 3.5–5.1)
Sodium: 136 mmol/L (ref 135–145)

## 2019-10-16 LAB — CBC WITH DIFFERENTIAL/PLATELET
Abs Immature Granulocytes: 0.81 10*3/uL — ABNORMAL HIGH (ref 0.00–0.07)
Basophils Absolute: 0.1 10*3/uL (ref 0.0–0.1)
Basophils Relative: 0 %
Eosinophils Absolute: 0 10*3/uL (ref 0.0–0.5)
Eosinophils Relative: 0 %
HCT: 41.1 % (ref 39.0–52.0)
Hemoglobin: 12.8 g/dL — ABNORMAL LOW (ref 13.0–17.0)
Immature Granulocytes: 5 %
Lymphocytes Relative: 6 %
Lymphs Abs: 1.1 10*3/uL (ref 0.7–4.0)
MCH: 24.5 pg — ABNORMAL LOW (ref 26.0–34.0)
MCHC: 31.1 g/dL (ref 30.0–36.0)
MCV: 78.7 fL — ABNORMAL LOW (ref 80.0–100.0)
Monocytes Absolute: 1 10*3/uL (ref 0.1–1.0)
Monocytes Relative: 6 %
Neutro Abs: 14.6 10*3/uL — ABNORMAL HIGH (ref 1.7–7.7)
Neutrophils Relative %: 83 %
Platelets: 395 10*3/uL (ref 150–400)
RBC: 5.22 MIL/uL (ref 4.22–5.81)
RDW: 16.8 % — ABNORMAL HIGH (ref 11.5–15.5)
WBC: 17.6 10*3/uL — ABNORMAL HIGH (ref 4.0–10.5)
nRBC: 0.2 % (ref 0.0–0.2)

## 2019-10-16 LAB — GLUCOSE, CAPILLARY
Glucose-Capillary: 226 mg/dL — ABNORMAL HIGH (ref 70–99)
Glucose-Capillary: 322 mg/dL — ABNORMAL HIGH (ref 70–99)
Glucose-Capillary: 378 mg/dL — ABNORMAL HIGH (ref 70–99)
Glucose-Capillary: 429 mg/dL — ABNORMAL HIGH (ref 70–99)

## 2019-10-16 LAB — CULTURE, BLOOD (ROUTINE X 2)
Culture: NO GROWTH
Culture: NO GROWTH
Special Requests: ADEQUATE

## 2019-10-16 LAB — C-REACTIVE PROTEIN: CRP: 1.8 mg/dL — ABNORMAL HIGH (ref <1.0)

## 2019-10-16 LAB — D-DIMER, QUANTITATIVE: D-Dimer, Quant: >20 ug/mL-FEU — ABNORMAL HIGH (ref 0.00–0.50)

## 2019-10-16 LAB — HEPARIN LEVEL (UNFRACTIONATED)
Heparin Unfractionated: 1.2 IU/mL — ABNORMAL HIGH (ref 0.30–0.70)
Heparin Unfractionated: 0.59 IU/mL (ref 0.30–0.70)
Heparin Unfractionated: 0.72 IU/mL — ABNORMAL HIGH (ref 0.30–0.70)

## 2019-10-16 LAB — PROCALCITONIN: Procalcitonin: <0.1 ng/mL

## 2019-10-16 MED ORDER — DEXAMETHASONE SODIUM PHOSPHATE 10 MG/ML IJ SOLN
6.0000 mg | INTRAMUSCULAR | Status: DC
  Administered 2019-10-17: 6 mg via INTRAVENOUS
  Filled 2019-10-16: qty 1

## 2019-10-16 MED ORDER — INSULIN DETEMIR 100 UNIT/ML ~~LOC~~ SOLN
12.0000 [IU] | Freq: Every day | SUBCUTANEOUS | Status: DC
  Administered 2019-10-17 – 2019-10-20 (×4): 12 [IU] via SUBCUTANEOUS
  Filled 2019-10-16 (×4): qty 0.12

## 2019-10-16 MED ORDER — INSULIN DETEMIR 100 UNIT/ML ~~LOC~~ SOLN
4.0000 [IU] | Freq: Once | SUBCUTANEOUS | Status: AC
  Administered 2019-10-16: 14:00:00 4 [IU] via SUBCUTANEOUS
  Filled 2019-10-16: qty 0.04

## 2019-10-16 MED ORDER — INSULIN ASPART 100 UNIT/ML ~~LOC~~ SOLN
3.0000 [IU] | Freq: Three times a day (TID) | SUBCUTANEOUS | Status: DC
  Administered 2019-10-16 – 2019-10-18 (×8): 3 [IU] via SUBCUTANEOUS
  Administered 2019-10-19 (×2): 100 [IU] via SUBCUTANEOUS
  Administered 2019-10-19 – 2019-10-20 (×3): 3 [IU] via SUBCUTANEOUS

## 2019-10-16 MED ORDER — HEPARIN (PORCINE) 25000 UT/250ML-% IV SOLN
950.0000 [IU]/h | INTRAVENOUS | Status: DC
  Administered 2019-10-16: 1000 [IU]/h via INTRAVENOUS
  Filled 2019-10-16: qty 250

## 2019-10-16 NOTE — Progress Notes (Signed)
ANTICOAGULATION CONSULT NOTE - Follow-up Consult  Pharmacy Consult for heparin Indication: DVT  Allergies  Allergen Reactions  . Metoclopramide Hcl Hypertension    Reglan=Increases HBP symptoms    Patient Measurements: Height: 5\' 9"  (175.3 cm) Weight: 189 lb (85.7 kg) IBW/kg (Calculated) : 70.7 Heparin Dosing Weight: 86 kg  Vital Signs: Temp: 98.1 F (36.7 C) (12/12 2000) Temp Source: Oral (12/12 2000) BP: 157/56 (12/12 2000) Pulse Rate: 71 (12/12 2000)  Labs: Recent Labs    10/14/19 0200 10/15/19 0125 10/16/19 0138 10/16/19 1415 10/16/19 2209  HGB 11.6*  --  12.8*  --   --   HCT 36.9*  --  41.1  --   --   PLT 353  --  395  --   --   HEPARINUNFRC  --   --  1.20* 0.59 0.72*  CREATININE 1.60* 1.66* 1.71*  --   --     Estimated Creatinine Clearance: 41.1 mL/min (A) (by C-G formula based on SCr of 1.71 mg/dL (H)).   Infusions:  . sodium chloride    . heparin 1,000 Units/hr (10/16/19 1341)    Assessment: Pt was admitted for COVID and has finished his course of remdesivir. He has been on appropriate therapy for his DVT prophylaxis since admission. 12/11 Doppler shows b/l acute vein thromboses. Heparin started per pharmacy.  Heparin level up to slightly supratherapeutic (0.72) on gtt at 100 units/hr. No bleeding noted.  Goal of Therapy:  Heparin level 0.3-0.7 units/ml Monitor platelets by anticoagulation protocol: Yes   Plan:  Decrease heparin drip to 950 units/hr Daily HL and CBC  Sherlon Handing, PharmD, BCPS Please see amion for complete clinical pharmacist phone list 10/16/2019 11:27 PM

## 2019-10-16 NOTE — Progress Notes (Signed)
PROGRESS NOTE    Matthew Bates  [id number] DOB: 12/03/1945 DOA: 10/09/2019 PCP: Marton Redwood, MD    Brief Narrative:  73 year old male who presented with dyspnea.  He does have significant past medical history for hypertension, dyslipidemia, type 2 diabetes mellitus, coronary artery disease and chronic kidney disease.  Patient presented with shortness of breath.  Noted to be hypoxic.  He was positive for COVID-19.  He was hospitalized for further management.  Chest x-ray showed interstitial infiltrates.      Assessment & Plan:  Acute hypoxic respiratory failure due to SARS COVID-19 viral pneumonia.   COVID-19 Labs  Recent Labs  Lab 10/09/19 1741 10/09/19 1741 10/10/19 0910 10/10/19 1040 10/11/19 0326 10/12/19 0355 10/13/19 0205 10/14/19 0200 10/15/19 0125 10/16/19 0138  DDIMER 3.94*   < > 2.10*  --  1.39* 1.54* 1.68* 8.02* >20.00* >20.00*  FERRITIN 158  --   --  81 337* 384* 369* 385*  --   --   CRP 19.8*  --   --  21.3* 19.4* 13.8* 8.4* 5.1*  --  1.8*  ALT  --   --  26  --  33 41 42 49*  --   --   PROCALCITON 0.76  --   --   --  1.01  --   --   --   --  <0.10   < > = values in this interval not displayed.   Positive COVID-19 test result from 10/09/2019.  Patient's respiratory status remains tenuous although his oxygen requirements have improved in the last 24 hours.  He was on 12 L yesterday and is noted to be on 5 to 6 L today.  He states that he is feeling better.  He does report some hemoptysis which will need to be monitored closely.  Procalcitonin less than 0.1.  Hold off on antibacterials.  WBC is elevated most likely due to steroids.  CRP is improved to 1.8.  Patient has completed course of remdesivir.  He remains on steroids.  Patient was also given Actemra and convalescent plasma.  He was also given Lasix for the last couple of days.  This will be held today.  Continue incentive spirometry and mobilization.  D-dimer remains elevated.  Continue to trend.  See  below.  Hemoptysis Most likely due to the viral pneumonia as well as suspected PE.  Monitor closely while he is getting anticoagulated.  Acute DVT involving bilateral lower extremity veins/suspected acute pulmonary embolism Noted to have a DVT in both his legs on venous Doppler.  Due to his high oxygen requirements and hemoptysis PE is also high on differential.  Cannot do CT angiogram as his creatinine is elevated.  Now that he is positive for DVT we did not need to pursue the CT angiogram as it will not change management.  Continue IV heparin for now.  If his blood counts remained stable for the next 24 hours we will transition him to oral agents at that time.  Acute on chronic diastolic heart failure Echocardiogram with preserved LV systolic function EF 45 to 50% with no wall motion abnormalities.  He has been given Lasix the last few days.  Hold for now due to rise in creatinine.  Strict ins and outs.  Daily weights.  AKI on CKD stage 2 Baseline creatinine around 1.5.  Renal function has been close to baseline.  BUN and creatinine noted to be higher today compared to yesterday.  Hold Lasix for today.  Continue to monitor  urine output.  Potassium is 3.9.    Diabetes mellitus type 2, uncontrolled with hyperglycemia HbA1c 7.2.  Elevated CBG most likely due to steroids.  Metformin is on hold.  Continue SSI.  Started on Levemir yesterday.  Increase dose today..   Essential hypertension Blood pressure is noted to be occasionally high.  Continue to monitor.  Continue amlodipine and labetalol.  His ARB is on hold.   Mild transaminitis Likely due to COVID-19.  Check periodically.  Coronary artery disease and dyslipidemia Stable.  Continue current medications.  No chest pain.  Continue aspirin.    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  Can go back to his outpatient providers for further management.    DVT prophylaxis: enoxaparin  Code Status:  Full Code Family Communication: Wife being updated  on a daily basis Disposition Plan: Not ready for discharge due to tenuous respiratory status.     Consultants:   Cardiology   Procedures:  Transthoracic echocardiogram 10/10/2019  1. Left ventricular ejection fraction, by visual estimation, is 45 to 50%. The left ventricle has mildly decreased function. There is no left ventricular hypertrophy.  2. Elevated left ventricular end-diastolic pressure.  3. Left ventricular diastolic parameters are indeterminate.  4. Global right ventricle has mildly reduced systolic function.The right ventricular size is mildly enlarged. Right vetricular wall thickness was not assessed.  5. Left atrial size was not assessed.  6. Right atrial size was not assessed.  7. The mitral valve is normal in structure. Mild mitral valve regurgitation.  8. The tricuspid valve is normal in structure. Tricuspid valve regurgitation is trivial.  9. The aortic valve is abnormal. Aortic valve regurgitation is not visualized. Mild to moderate aortic valve sclerosis/calcification without any evidence of aortic stenosis. 10. The pulmonic valve was grossly normal. Pulmonic valve regurgitation is mild. 11. Normal pulmonary artery systolic pressure.   Antimicrobials:  Anti-infectives (From admission, onward)   Start     Dose/Rate Route Frequency Ordered Stop   10/12/19 1000  vancomycin (VANCOCIN) IVPB 1000 mg/200 mL premix  Status:  Discontinued     1,000 mg 200 mL/hr over 60 Minutes Intravenous Every 24 hours 10/11/19 0916 10/11/19 1731   10/11/19 1000  ceFEPIme (MAXIPIME) 2 g in sodium chloride 0.9 % 100 mL IVPB  Status:  Discontinued     2 g 200 mL/hr over 30 Minutes Intravenous Every 12 hours 10/11/19 0916 10/12/19 1407   10/11/19 0930  vancomycin (VANCOCIN) 1,750 mg in sodium chloride 0.9 % 500 mL IVPB     1,750 mg 250 mL/hr over 120 Minutes Intravenous  Once 10/11/19 0916 10/11/19 1430   10/10/19 1000  remdesivir 100 mg in sodium chloride 0.9 % 100 mL IVPB     100  mg 200 mL/hr over 30 Minutes Intravenous Daily 10/09/19 1709 10/13/19 1127   10/09/19 1930  cefTRIAXone (ROCEPHIN) 2 g in sodium chloride 0.9 % 100 mL IVPB  Status:  Discontinued     2 g 200 mL/hr over 30 Minutes Intravenous Every 24 hours 10/09/19 1844 10/11/19 0909   10/09/19 1930  azithromycin (ZITHROMAX) 500 mg in sodium chloride 0.9 % 250 mL IVPB  Status:  Discontinued     500 mg 250 mL/hr over 60 Minutes Intravenous Every 24 hours 10/09/19 1844 10/11/19 1731   10/09/19 1800  remdesivir 200 mg in sodium chloride 0.9% 250 mL IVPB     200 mg 580 mL/hr over 30 Minutes Intravenous Once 10/09/19 1709 10/09/19 1838  Subjective: States that he is feeling better.  Some blood in the sputum at times.  Denies any chest pain.  No nausea vomiting.  Objective: Vitals:   10/15/19 2042 10/16/19 0350 10/16/19 0745 10/16/19 1159  BP: (!) 149/87 (!) 146/66 (!) 142/82 128/79  Pulse: 76 72 75 71  Resp: (!) 23 18 19 20   Temp:  98.2 F (36.8 C) 98 F (36.7 C) 98.2 F (36.8 C)  TempSrc:  Oral Oral Oral  SpO2: 92% 96% 93% 100%  Weight:      Height:        Intake/Output Summary (Last 24 hours) at 10/16/2019 1202 Last data filed at 10/15/2019 2243 Gross per 24 hour  Intake 200 ml  Output 1400 ml  Net -1200 ml   Filed Weights   10/09/19 1900  Weight: 85.7 kg    Examination:   General appearance: Awake alert.  In no distress Resp: Mildly tachypneic at rest.  Coarse breath sound bilaterally with crackles at the bases.  No wheezing or rhonchi.   Cardio: S1-S2 is normal regular.  No S3-S4.  No rubs murmurs or bruit GI: Abdomen is soft.  Nontender nondistended.  Bowel sounds are present normal.  No masses organomegaly Extremities: No edema.  Full range of motion of lower extremities. Neurologic: Alert and oriented x3.  No focal neurological deficits.    Data Reviewed: I have personally reviewed following labs and imaging studies  CBC: Recent Labs  Lab 10/11/19 0326  10/12/19 0355 10/13/19 0205 10/14/19 0200 10/16/19 0138  WBC 13.7* 10.3 10.3 12.8* 17.6*  NEUTROABS 11.4* 8.8* 8.6* 11.1* 14.6*  HGB 12.3* 12.1* 11.2* 11.6* 12.8*  HCT 38.8* 38.6* 35.7* 36.9* 41.1  MCV 80.2 78.3* 78.3* 78.8* 78.7*  PLT 314 347 331 353 [id number]   Basic Metabolic Panel: Recent Labs  Lab 10/10/19 0910 10/11/19 0326 10/12/19 0355 10/13/19 0205 10/14/19 0200 10/15/19 0125 10/16/19 0138  NA 136 137 141 139 139 137 136  K 3.7 3.9 3.5 3.5 3.6 3.6 3.9  CL 106 107 106 104 102 100 94*  CO2 19* 17* 21* 24 23 25 27   GLUCOSE 147* 167* 190* 177* 182* 239* 279*  BUN 32* 36* 35* 44* 46* 49* 52*  CREATININE 1.99* 1.80* 1.56* 1.39* 1.60* 1.66* 1.71*  CALCIUM 8.4* 8.6* 8.4* 8.3* 8.8* 8.8* 9.0  MG 1.7 2.4 2.0 2.2 2.1  --   --   PHOS 4.2 3.1 2.3* 2.6 2.5  --   --    GFR: Estimated Creatinine Clearance: 41.1 mL/min (A) (by C-G formula based on SCr of 1.71 mg/dL (H)). Liver Function Tests: Recent Labs  Lab 10/10/19 0910 10/11/19 0326 10/12/19 0355 10/13/19 0205 10/14/19 0200  AST 70* 89* 85* 72* 74*  ALT 26 33 41 42 49*  ALKPHOS 38 51 67 78 126  BILITOT 0.4 0.4 0.6 1.0 0.6  PROT 6.0* 6.9 6.8 6.2* 6.3*  ALBUMIN 2.4* 2.7* 2.6* 2.6* 2.5*   CBG: Recent Labs  Lab 10/15/19 0813 10/15/19 1138 10/15/19 1645 10/15/19 2141 10/16/19 0719  GLUCAP 182* 297* 377* 338* 226*   Anemia Panel: Recent Labs    10/14/19 0200  FERRITIN 385*      Radiology Studies: DG CHEST PORT 1 VIEW  Result Date: 10/14/2019 CLINICAL DATA:  Pneumonia. EXAM: PORTABLE CHEST 1 VIEW COMPARISON:  October 11, 2019. FINDINGS: Stable cardiomediastinal silhouette. Status post coronary bypass graft. No pneumothorax or pleural effusion is noted. Stable bilateral lung opacities are noted. Bony thorax is unremarkable. IMPRESSION: Stable bilateral  lung opacities are noted concerning for multifocal pneumonia. Electronically Signed   By: Marijo Conception M.D.   On: 10/14/2019 08:11   LE Venous  Result Date:  10/16/2019  Lower Venous Study Indications: Elevated d-dimer.  Comparison Study: Performing Technologist: Maudry Mayhew MHA, RDMS, RVT, RDCS  Examination Guidelines: A complete evaluation includes B-mode imaging, spectral Doppler, color Doppler, and power Doppler as needed of all accessible portions of each vessel. Bilateral testing is considered an integral part of a complete examination. Limited examinations for reoccurring indications may be performed as noted.  +---------+---------------+---------+-----------+----------+--------------+ RIGHT    CompressibilityPhasicitySpontaneityPropertiesThrombus Aging +---------+---------------+---------+-----------+----------+--------------+ CFV      Full           Yes      Yes                                 +---------+---------------+---------+-----------+----------+--------------+ SFJ      Full                                                        +---------+---------------+---------+-----------+----------+--------------+ FV Prox  Full                                                        +---------+---------------+---------+-----------+----------+--------------+ FV Mid   Full                                                        +---------+---------------+---------+-----------+----------+--------------+ FV DistalFull                                                        +---------+---------------+---------+-----------+----------+--------------+ PFV      Full                                                        +---------+---------------+---------+-----------+----------+--------------+ POP      Full           Yes      Yes                                 +---------+---------------+---------+-----------+----------+--------------+ PTV      None                    No                   Acute          +---------+---------------+---------+-----------+----------+--------------+ PERO     None  No                   Acute          +---------+---------------+---------+-----------+----------+--------------+   +---------+---------------+---------+-----------+----------+--------------+ LEFT     CompressibilityPhasicitySpontaneityPropertiesThrombus Aging +---------+---------------+---------+-----------+----------+--------------+ CFV      Full           Yes      Yes                                 +---------+---------------+---------+-----------+----------+--------------+ SFJ      Full                                                        +---------+---------------+---------+-----------+----------+--------------+ FV Prox  Full                                                        +---------+---------------+---------+-----------+----------+--------------+ FV Mid   Full                                                        +---------+---------------+---------+-----------+----------+--------------+ FV DistalFull                                                        +---------+---------------+---------+-----------+----------+--------------+ PFV      Full                                                        +---------+---------------+---------+-----------+----------+--------------+ POP      Full           Yes      Yes                                 +---------+---------------+---------+-----------+----------+--------------+ PTV      Full                                                        +---------+---------------+---------+-----------+----------+--------------+ PERO     Full                                                        +---------+---------------+---------+-----------+----------+--------------+ Soleal   None  No                   Acute          +---------+---------------+---------+-----------+----------+--------------+ Gastroc  None                    No                   Acute           +---------+---------------+---------+-----------+----------+--------------+  Summary: Right: Findings consistent with acute deep vein thrombosis involving the right posterior tibial veins, and right peroneal veins. Left: Findings consistent with acute deep vein thrombosis involving the left soleal veins, and left gastrocnemius veins. No cystic structure found in the popliteal fossa.  *See table(s) above for measurements and observations. Electronically signed by Servando Snare MD on 10/16/2019 at 10:18:42 AM.    Final         Scheduled Meds: . sodium chloride   Intravenous Once  . amLODipine  5 mg Oral Daily  . aspirin EC  81 mg Oral Daily  . cholecalciferol  1,000 Units Oral Daily  . dexamethasone (DECADRON) injection  6 mg Intravenous Q24H  . docusate sodium  100 mg Oral BID  . famotidine  40 mg Oral BID  . feeding supplement (GLUCERNA SHAKE)  237 mL Oral TID BM  . fenofibrate  160 mg Oral Daily  . insulin aspart  0-15 Units Subcutaneous TID WC  . insulin aspart  0-5 Units Subcutaneous QHS  . insulin detemir  8 Units Subcutaneous Daily  . Ipratropium-Albuterol  4 puff Inhalation QID  . labetalol  100 mg Oral BID  . rosuvastatin  20 mg Oral Daily  . sodium chloride flush  3 mL Intravenous Q12H  . sodium chloride flush  3 mL Intravenous Q12H  . vitamin C  500 mg Oral Daily  . zinc sulfate  220 mg Oral Daily   Continuous Infusions: . sodium chloride    . heparin 1,000 Units/hr (10/16/19 0556)     LOS: 7 days    Bonnielee Haff, MD Pager: [email or web address]

## 2019-10-16 NOTE — Progress Notes (Signed)
ANTICOAGULATION CONSULT NOTE - Follow-up Consult  Pharmacy Consult for heparin Indication: DVT  Allergies  Allergen Reactions  . Metoclopramide Hcl Hypertension    Reglan=Increases HBP symptoms    Patient Measurements: Height: 5\' 9"  (175.3 cm) Weight: 189 lb (85.7 kg) IBW/kg (Calculated) : 70.7 Heparin Dosing Weight: 86 kg  Vital Signs: Temp: 98.2 F (36.8 C) (12/12 0350) Temp Source: Oral (12/12 0350) BP: 146/66 (12/12 0350) Pulse Rate: 72 (12/12 0350)  Labs: Recent Labs    10/14/19 0200 10/15/19 0125 10/16/19 0138  HGB 11.6*  --  12.8*  HCT 36.9*  --  41.1  PLT 353  --  395  HEPARINUNFRC  --   --  1.20*  CREATININE 1.60* 1.66* 1.71*    Estimated Creatinine Clearance: 41.1 mL/min (A) (by C-G formula based on SCr of 1.71 mg/dL (H)).   Infusions:  . sodium chloride    . heparin 1,250 Units/hr (10/15/19 1852)    Assessment: Pt was admitted for COVID and has finished his course of remdesivir. He has been on appropriate therapy for his DVT prophylaxis since admission. 12/11 Doppler shows b/l acute vein thromboses. Heparin started per pharmacy.  Heparin level supratherapeutic (1.2) on gtt at 1250 units/hr. No bleeding noted per RN. Lab appears to have been drawn correctly.  Goal of Therapy:  Heparin level 0.3-0.7 units/ml Monitor platelets by anticoagulation protocol: Yes   Plan:  Hold heparin x 1 hour Restart heparin drip at 1000 units/hr F/u with 8 hr heparin level Daily HL and CBC  Sherlon Handing, PharmD, BCPS Please see amion for complete clinical pharmacist phone list 10/16/2019 4:07 AM

## 2019-10-16 NOTE — Progress Notes (Signed)
ANTICOAGULATION CONSULT NOTE - Follow-up Consult  Pharmacy Consult for heparin Indication: DVT  Allergies  Allergen Reactions  . Metoclopramide Hcl Hypertension    Reglan=Increases HBP symptoms    Patient Measurements: Height: 5\' 9"  (175.3 cm) Weight: 189 lb (85.7 kg) IBW/kg (Calculated) : 70.7 Heparin Dosing Weight: 86 kg  Vital Signs: Temp: 97.9 F (36.6 C) (12/12 1548) Temp Source: Oral (12/12 1548) BP: 143/47 (12/12 1548) Pulse Rate: 89 (12/12 1548)  Labs: Recent Labs    10/14/19 0200 10/15/19 0125 10/16/19 0138 10/16/19 1415  HGB 11.6*  --  12.8*  --   HCT 36.9*  --  41.1  --   PLT 353  --  395  --   HEPARINUNFRC  --   --  1.20* 0.59  CREATININE 1.60* 1.66* 1.71*  --     Estimated Creatinine Clearance: 41.1 mL/min (A) (by C-G formula based on SCr of 1.71 mg/dL (H)).   Infusions:  . sodium chloride    . heparin 1,000 Units/hr (10/16/19 1341)    Assessment: Pt was admitted for COVID and has finished his course of remdesivir. He has been on appropriate therapy for his DVT prophylaxis since admission. 12/11 Doppler shows b/l acute vein thromboses. Heparin started per pharmacy.  Heparin level is now therapeutic at 0.59. We will continue with the same rate and recheck later tonight to confirm.   Goal of Therapy:  Heparin level 0.3-0.7 units/ml Monitor platelets by anticoagulation protocol: Yes   Plan:   Cont heparin drip at 1000 units/hr F/u with 6 hr heparin level to confirm Daily HL and CBC  Onnie Boer, PharmD, BCIDP, AAHIVP, CPP Infectious Disease Pharmacist 10/16/2019 4:16 PM

## 2019-10-17 LAB — CBC WITH DIFFERENTIAL/PLATELET
Abs Immature Granulocytes: 0.91 10*3/uL — ABNORMAL HIGH (ref 0.00–0.07)
Basophils Absolute: 0.1 10*3/uL (ref 0.0–0.1)
Basophils Relative: 0 %
Eosinophils Absolute: 0 10*3/uL (ref 0.0–0.5)
Eosinophils Relative: 0 %
HCT: 39.3 % (ref 39.0–52.0)
Hemoglobin: 12.3 g/dL — ABNORMAL LOW (ref 13.0–17.0)
Immature Granulocytes: 5 %
Lymphocytes Relative: 6 %
Lymphs Abs: 1.2 10*3/uL (ref 0.7–4.0)
MCH: 24.6 pg — ABNORMAL LOW (ref 26.0–34.0)
MCHC: 31.3 g/dL (ref 30.0–36.0)
MCV: 78.4 fL — ABNORMAL LOW (ref 80.0–100.0)
Monocytes Absolute: 1.3 10*3/uL — ABNORMAL HIGH (ref 0.1–1.0)
Monocytes Relative: 6 %
Neutro Abs: 16.6 10*3/uL — ABNORMAL HIGH (ref 1.7–7.7)
Neutrophils Relative %: 83 %
Platelets: 425 10*3/uL — ABNORMAL HIGH (ref 150–400)
RBC: 5.01 MIL/uL (ref 4.22–5.81)
RDW: 17 % — ABNORMAL HIGH (ref 11.5–15.5)
WBC: 20.1 10*3/uL — ABNORMAL HIGH (ref 4.0–10.5)
nRBC: 0.2 % (ref 0.0–0.2)

## 2019-10-17 LAB — BASIC METABOLIC PANEL
Anion gap: 13 (ref 5–15)
BUN: 54 mg/dL — ABNORMAL HIGH (ref 8–23)
CO2: 26 mmol/L (ref 22–32)
Calcium: 9.5 mg/dL (ref 8.9–10.3)
Chloride: 94 mmol/L — ABNORMAL LOW (ref 98–111)
Creatinine, Ser: 1.56 mg/dL — ABNORMAL HIGH (ref 0.61–1.24)
GFR calc Af Amer: 50 mL/min — ABNORMAL LOW (ref >60)
GFR calc non Af Amer: 43 mL/min — ABNORMAL LOW (ref >60)
Glucose, Bld: 238 mg/dL — ABNORMAL HIGH (ref 70–99)
Potassium: 4.7 mmol/L (ref 3.5–5.1)
Sodium: 133 mmol/L — ABNORMAL LOW (ref 135–145)

## 2019-10-17 LAB — GLUCOSE, CAPILLARY
Glucose-Capillary: 238 mg/dL — ABNORMAL HIGH (ref 70–99)
Glucose-Capillary: 261 mg/dL — ABNORMAL HIGH (ref 70–99)
Glucose-Capillary: 366 mg/dL — ABNORMAL HIGH (ref 70–99)
Glucose-Capillary: 402 mg/dL — ABNORMAL HIGH (ref 70–99)

## 2019-10-17 LAB — D-DIMER, QUANTITATIVE: D-Dimer, Quant: 11.2 ug/mL-FEU — ABNORMAL HIGH (ref 0.00–0.50)

## 2019-10-17 LAB — C-REACTIVE PROTEIN: CRP: 0.9 mg/dL (ref <1.0)

## 2019-10-17 LAB — HEPARIN LEVEL (UNFRACTIONATED): Heparin Unfractionated: 0.55 IU/mL (ref 0.30–0.70)

## 2019-10-17 LAB — APTT: aPTT: 29 seconds (ref 24–36)

## 2019-10-17 MED ORDER — DEXAMETHASONE SODIUM PHOSPHATE 4 MG/ML IJ SOLN
4.0000 mg | INTRAMUSCULAR | Status: DC
  Administered 2019-10-18 – 2019-10-21 (×4): 4 mg via INTRAVENOUS
  Filled 2019-10-17 (×3): qty 1

## 2019-10-17 MED ORDER — ENOXAPARIN SODIUM 100 MG/ML ~~LOC~~ SOLN
85.0000 mg | Freq: Two times a day (BID) | SUBCUTANEOUS | Status: AC
  Administered 2019-10-17: 85 mg via SUBCUTANEOUS
  Filled 2019-10-17: qty 1

## 2019-10-17 MED ORDER — ENOXAPARIN SODIUM 100 MG/ML ~~LOC~~ SOLN
85.0000 mg | Freq: Two times a day (BID) | SUBCUTANEOUS | Status: DC
  Administered 2019-10-17 – 2019-10-21 (×8): 85 mg via SUBCUTANEOUS
  Filled 2019-10-17 (×9): qty 1

## 2019-10-17 NOTE — Progress Notes (Signed)
PROGRESS NOTE    Matthew Bates  [id number] DOB: 01/20/1946 DOA: 10/09/2019 PCP: Marton Redwood, MD    Brief Narrative:  73 year old male who presented with dyspnea.  He does have significant past medical history for hypertension, dyslipidemia, type 2 diabetes mellitus, coronary artery disease and chronic kidney disease.  Patient presented with shortness of breath.  Noted to be hypoxic.  He was positive for COVID-19.  He was hospitalized for further management.  Chest x-ray showed interstitial infiltrates.      Assessment & Plan:  Acute hypoxic respiratory failure due to SARS COVID-19 viral pneumonia.   COVID-19 Labs  Recent Labs  Lab 10/11/19 0326 10/12/19 0355 10/13/19 0205 10/14/19 0200 10/15/19 0125 10/16/19 0138 10/17/19 0510  DDIMER 1.39* 1.54* 1.68* 8.02* >20.00* >20.00* 11.20*  FERRITIN 337* 384* 369* 385*  --   --   --   CRP 19.4* 13.8* 8.4* 5.1*  --  1.8* 0.9  ALT 33 41 42 49*  --   --   --   PROCALCITON 1.01  --   --   --   --  <0.10  --    Positive COVID-19 test result from 10/09/2019.  Patient's respiratory status is stable for the most part.  His oxygen requirements have been fluctuating.  He is noted to be on 6 L this morning.  States that his hemoptysis is better.  Procalcitonin was less than 0.1.  No need for antibacterials.  D-dimer improved to 11.20 today.  Elevated WBC is due to steroids.  CRP is now normal.  Patient has completed course of remdesivir.  He remains on steroids.  He was given Actemra and convalescent plasma as well.  He was also given Lasix for a few days.  Continue with incentive spirometry and mobilization.  Hemoptysis Most likely due to the viral pneumonia as well as suspected PE.  Monitor closely while he is getting anticoagulated.  Seems to be improved.  Hemoglobin is stable.  Acute DVT involving bilateral lower extremity veins/suspected acute pulmonary embolism Noted to have a DVT in both his legs on venous Doppler.  Due to his high  oxygen requirements and hemoptysis PE is also high on differential.  Cannot do CT angiogram as his creatinine is elevated.  Now that he is positive for DVT we did not need to pursue the CT angiogram as it will not change management.  Change IV heparin to Lovenox for now.  And then anticipate change to oral anticoagulation after 24 to 48 hours.    Acute on chronic diastolic heart failure Echocardiogram with preserved LV systolic function EF 45 to 50% with no wall motion abnormalities.  He has been given Lasix the last few days.  Held since yesterday due to elevated creatinine.  Monitor urine output.  Ins and outs.    AKI on CKD stage 2 Baseline creatinine around 1.5.  Renal function close to baseline.  Lasix was held yesterday due to increasing BUN and creatinine.  Continue to monitor for now.  Stable this morning.  Potassium is 4.7.  Diabetes mellitus type 2, uncontrolled with hyperglycemia HbA1c 7.2.  Elevated CBG most likely due to steroids.  Metformin is on hold.  Continue SSI.  Continue Levemir.  Start tapering steroids.     Essential hypertension Occasional high readings noted.  Could be due to steroids.  Continue amlodipine and labetalol.  ARB is on hold.    Mild transaminitis Likely due to COVID-19.  Check in the outpatient setting in a few weeks.  Coronary artery disease and dyslipidemia Stable.  Continue current medications.  No chest pain.  Continue aspirin.    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  Can go back to his outpatient providers for further management.    DVT prophylaxis: enoxaparin  Code Status:  Full Code Family Communication: Wife being updated on a daily basis Disposition Plan: Not ready for discharge due to tenuous respiratory status.     Consultants:   Cardiology   Procedures:  Transthoracic echocardiogram 10/10/2019  1. Left ventricular ejection fraction, by visual estimation, is 45 to 50%. The left ventricle has mildly decreased function. There is no left  ventricular hypertrophy.  2. Elevated left ventricular end-diastolic pressure.  3. Left ventricular diastolic parameters are indeterminate.  4. Global right ventricle has mildly reduced systolic function.The right ventricular size is mildly enlarged. Right vetricular wall thickness was not assessed.  5. Left atrial size was not assessed.  6. Right atrial size was not assessed.  7. The mitral valve is normal in structure. Mild mitral valve regurgitation.  8. The tricuspid valve is normal in structure. Tricuspid valve regurgitation is trivial.  9. The aortic valve is abnormal. Aortic valve regurgitation is not visualized. Mild to moderate aortic valve sclerosis/calcification without any evidence of aortic stenosis. 10. The pulmonic valve was grossly normal. Pulmonic valve regurgitation is mild. 11. Normal pulmonary artery systolic pressure.   Antimicrobials:  Anti-infectives (From admission, onward)   Start     Dose/Rate Route Frequency Ordered Stop   10/12/19 1000  vancomycin (VANCOCIN) IVPB 1000 mg/200 mL premix  Status:  Discontinued     1,000 mg 200 mL/hr over 60 Minutes Intravenous Every 24 hours 10/11/19 0916 10/11/19 1731   10/11/19 1000  ceFEPIme (MAXIPIME) 2 g in sodium chloride 0.9 % 100 mL IVPB  Status:  Discontinued     2 g 200 mL/hr over 30 Minutes Intravenous Every 12 hours 10/11/19 0916 10/12/19 1407   10/11/19 0930  vancomycin (VANCOCIN) 1,750 mg in sodium chloride 0.9 % 500 mL IVPB     1,750 mg 250 mL/hr over 120 Minutes Intravenous  Once 10/11/19 0916 10/11/19 1430   10/10/19 1000  remdesivir 100 mg in sodium chloride 0.9 % 100 mL IVPB     100 mg 200 mL/hr over 30 Minutes Intravenous Daily 10/09/19 1709 10/13/19 1127   10/09/19 1930  cefTRIAXone (ROCEPHIN) 2 g in sodium chloride 0.9 % 100 mL IVPB  Status:  Discontinued     2 g 200 mL/hr over 30 Minutes Intravenous Every 24 hours 10/09/19 1844 10/11/19 0909   10/09/19 1930  azithromycin (ZITHROMAX) 500 mg in sodium  chloride 0.9 % 250 mL IVPB  Status:  Discontinued     500 mg 250 mL/hr over 60 Minutes Intravenous Every 24 hours 10/09/19 1844 10/11/19 1731   10/09/19 1800  remdesivir 200 mg in sodium chloride 0.9% 250 mL IVPB     200 mg 580 mL/hr over 30 Minutes Intravenous Once 10/09/19 1709 10/09/19 1838         Subjective: Patient states that he feels better.  Blood in sputum has decreased.  Denies any chest pain.  Objective: Vitals:   10/16/19 2000 10/17/19 0400 10/17/19 1132 10/17/19 1141  BP: (!) 157/56 (!) 149/68  (!) 160/57  Pulse: 71 (!) 58 84 76  Resp: (!) 21 16  18   Temp: 98.1 F (36.7 C) 97.6 F (36.4 C)  97.6 F (36.4 C)  TempSrc: Oral Axillary  Oral  SpO2: 91% 95%  Weight:      Height:        Intake/Output Summary (Last 24 hours) at 10/17/2019 1144 Last data filed at 10/17/2019 1141 Gross per 24 hour  Intake 77 ml  Output 1475 ml  Net -1398 ml   Filed Weights   10/09/19 1900  Weight: 85.7 kg    Examination:   General appearance: Awake alert.  In no distress Resp: Normal effort at rest.  Coarse breath sound bilaterally with few crackles at the bases.  No wheezing or rhonchi.   Cardio: S1-S2 is normal regular.  No S3-S4.  No rubs murmurs or bruit GI: Abdomen is soft.  Nontender nondistended.  Bowel sounds are present normal.  No masses organomegaly Extremities: No edema.  Full range of motion of lower extremities. Neurologic: Alert and oriented x3.  No focal neurological deficits.     Data Reviewed: I have personally reviewed following labs and imaging studies  CBC: Recent Labs  Lab 10/12/19 0355 10/13/19 0205 10/14/19 0200 10/16/19 0138 10/17/19 0510  WBC 10.3 10.3 12.8* 17.6* 20.1*  NEUTROABS 8.8* 8.6* 11.1* 14.6* 16.6*  HGB 12.1* 11.2* 11.6* 12.8* 12.3*  HCT 38.6* 35.7* 36.9* 41.1 39.3  MCV 78.3* 78.3* 78.8* 78.7* 78.4*  PLT 347 331 353 395 [id number]*   Basic Metabolic Panel: Recent Labs  Lab 10/11/19 0326 10/12/19 0355 10/13/19 0205  10/14/19 0200 10/15/19 0125 10/16/19 0138 10/17/19 0510  NA 137 141 139 139 137 136 133*  K 3.9 3.5 3.5 3.6 3.6 3.9 4.7  CL 107 106 104 102 100 94* 94*  CO2 17* 21* 24 23 25 27 26   GLUCOSE 167* 190* 177* 182* 239* 279* 238*  BUN 36* 35* 44* 46* 49* 52* 54*  CREATININE 1.80* 1.56* 1.39* 1.60* 1.66* 1.71* 1.56*  CALCIUM 8.6* 8.4* 8.3* 8.8* 8.8* 9.0 9.5  MG 2.4 2.0 2.2 2.1  --   --   --   PHOS 3.1 2.3* 2.6 2.5  --   --   --    GFR: Estimated Creatinine Clearance: 45.1 mL/min (A) (by C-G formula based on SCr of 1.56 mg/dL (H)). Liver Function Tests: Recent Labs  Lab 10/11/19 0326 10/12/19 0355 10/13/19 0205 10/14/19 0200  AST 89* 85* 72* 74*  ALT 33 41 42 49*  ALKPHOS 51 67 78 126  BILITOT 0.4 0.6 1.0 0.6  PROT 6.9 6.8 6.2* 6.3*  ALBUMIN 2.7* 2.6* 2.6* 2.5*   CBG: Recent Labs  Lab 10/16/19 0719 10/16/19 1142 10/16/19 1640 10/16/19 2156 10/17/19 0721  GLUCAP 226* 322* 378* 429* 238*   Anemia Panel: No results for input(s): VITAMINB12, FOLATE, FERRITIN, TIBC, IRON, RETICCTPCT in the last 72 hours.    Radiology Studies: DG CHEST PORT 1 VIEW  Result Date: 10/14/2019 CLINICAL DATA:  Pneumonia. EXAM: PORTABLE CHEST 1 VIEW COMPARISON:  October 11, 2019. FINDINGS: Stable cardiomediastinal silhouette. Status post coronary bypass graft. No pneumothorax or pleural effusion is noted. Stable bilateral lung opacities are noted. Bony thorax is unremarkable. IMPRESSION: Stable bilateral lung opacities are noted concerning for multifocal pneumonia. Electronically Signed   By: Marijo Conception M.D.   On: 10/14/2019 08:11   LE Venous  Result Date: 10/16/2019  Lower Venous Study Indications: Elevated d-dimer.  Comparison Study: Performing Technologist: Maudry Mayhew MHA, RDMS, RVT, RDCS  Examination Guidelines: A complete evaluation includes B-mode imaging, spectral Doppler, color Doppler, and power Doppler as needed of all accessible portions of each vessel. Bilateral testing is  considered an integral part of a complete examination.  Limited examinations for reoccurring indications may be performed as noted.  +---------+---------------+---------+-----------+----------+--------------+ RIGHT    CompressibilityPhasicitySpontaneityPropertiesThrombus Aging +---------+---------------+---------+-----------+----------+--------------+ CFV      Full           Yes      Yes                                 +---------+---------------+---------+-----------+----------+--------------+ SFJ      Full                                                        +---------+---------------+---------+-----------+----------+--------------+ FV Prox  Full                                                        +---------+---------------+---------+-----------+----------+--------------+ FV Mid   Full                                                        +---------+---------------+---------+-----------+----------+--------------+ FV DistalFull                                                        +---------+---------------+---------+-----------+----------+--------------+ PFV      Full                                                        +---------+---------------+---------+-----------+----------+--------------+ POP      Full           Yes      Yes                                 +---------+---------------+---------+-----------+----------+--------------+ PTV      None                    No                   Acute          +---------+---------------+---------+-----------+----------+--------------+ PERO     None                    No                   Acute          +---------+---------------+---------+-----------+----------+--------------+   +---------+---------------+---------+-----------+----------+--------------+ LEFT     CompressibilityPhasicitySpontaneityPropertiesThrombus Aging  +---------+---------------+---------+-----------+----------+--------------+ CFV      Full           Yes      Yes                                 +---------+---------------+---------+-----------+----------+--------------+  SFJ      Full                                                        +---------+---------------+---------+-----------+----------+--------------+ FV Prox  Full                                                        +---------+---------------+---------+-----------+----------+--------------+ FV Mid   Full                                                        +---------+---------------+---------+-----------+----------+--------------+ FV DistalFull                                                        +---------+---------------+---------+-----------+----------+--------------+ PFV      Full                                                        +---------+---------------+---------+-----------+----------+--------------+ POP      Full           Yes      Yes                                 +---------+---------------+---------+-----------+----------+--------------+ PTV      Full                                                        +---------+---------------+---------+-----------+----------+--------------+ PERO     Full                                                        +---------+---------------+---------+-----------+----------+--------------+ Soleal   None                    No                   Acute          +---------+---------------+---------+-----------+----------+--------------+ Gastroc  None                    No                   Acute          +---------+---------------+---------+-----------+----------+--------------+  Summary: Right: Findings consistent with acute deep  vein thrombosis involving the right posterior tibial veins, and right peroneal veins. Left: Findings consistent with acute deep vein thrombosis  involving the left soleal veins, and left gastrocnemius veins. No cystic structure found in the popliteal fossa.  *See table(s) above for measurements and observations. Electronically signed by Servando Snare MD on 10/16/2019 at 10:18:42 AM.    Final         Scheduled Meds: . sodium chloride   Intravenous Once  . amLODipine  5 mg Oral Daily  . aspirin EC  81 mg Oral Daily  . cholecalciferol  1,000 Units Oral Daily  . dexamethasone (DECADRON) injection  6 mg Intravenous Q24H  . docusate sodium  100 mg Oral BID  . famotidine  40 mg Oral BID  . feeding supplement (GLUCERNA SHAKE)  237 mL Oral TID BM  . fenofibrate  160 mg Oral Daily  . insulin aspart  0-15 Units Subcutaneous TID WC  . insulin aspart  0-5 Units Subcutaneous QHS  . insulin aspart  3 Units Subcutaneous TID WC  . insulin detemir  12 Units Subcutaneous Daily  . Ipratropium-Albuterol  4 puff Inhalation QID  . labetalol  100 mg Oral BID  . rosuvastatin  20 mg Oral Daily  . sodium chloride flush  3 mL Intravenous Q12H  . sodium chloride flush  3 mL Intravenous Q12H  . vitamin C  500 mg Oral Daily  . zinc sulfate  220 mg Oral Daily   Continuous Infusions: . sodium chloride    . heparin 950 Units/hr (10/17/19 0009)     LOS: 8 days    Bonnielee Haff, MD Pager: [email or web address]

## 2019-10-17 NOTE — Progress Notes (Addendum)
ANTICOAGULATION CONSULT NOTE   Pharmacy Consult for heparin Indication: DVT   Patient Measurements: Height: 5\' 9"  (175.3 cm) Weight: 189 lb (85.7 kg) IBW/kg (Calculated) : 70.7 Heparin Dosing Weight: 86 kg  Vital Signs: Temp: 97.6 F (36.4 C) (12/13 0400) Temp Source: Axillary (12/13 0400) BP: 149/68 (12/13 0400) Pulse Rate: 58 (12/13 0400)  Labs: Recent Labs    10/15/19 0125 10/16/19 0138 10/16/19 1415 10/16/19 2209 10/17/19 0510  HGB  --  12.8*  --   --  12.3*  HCT  --  41.1  --   --  39.3  PLT  --  395  --   --  425*  HEPARINUNFRC  --  1.20* 0.59 0.72* 0.55  CREATININE 1.66* 1.71*  --   --  1.56*    Estimated Creatinine Clearance: 45.1 mL/min (A) (by C-G formula based on SCr of 1.56 mg/dL (H)).   Infusions:  . sodium chloride    . heparin 950 Units/hr (10/17/19 0009)    Assessment: Pt was admitted for COVID and has finished his course of remdesivir. He has been on appropriate therapy for his DVT prophylaxis since admission. 12/11 Doppler shows b/l acute vein thromboses. Heparin started per pharmacy.  Heparin level therapeutic this morning, CBC stable, D-dimer trending down.  Goal of Therapy:  Heparin level 0.3-0.7 units/ml Monitor platelets by anticoagulation protocol: Yes   Plan:  Cont heparin drip at 950 units/hr Recheck heparin level in am   Arrie Senate, PharmD, BCPS Clinical Pharmacist Please check AMION for all [place] numbers 10/17/2019   Addendum -Stop heparin, start Lovenox -Will administer Lovenox 85 mg [place] q12h -Monitor s/sx bleeding -F/u plan for PO anticoagulation    Harvel Quale 10/17/2019 12:08 PM

## 2019-10-17 NOTE — Progress Notes (Signed)
Occupational Therapy Evaluation Patient Details Name: Matthew Bates MRN: [id number] DOB: 1946-09-20 Today's Date: 10/17/2019    History of Present Illness Pt is a 73 y.o. male admitted 10/09/19 with dyspnea. (+) COVID-19. PMH includes HTN, DM2, CAD, CKD.   Clinical Impression   PTA pt lived with his wife, independent in all ADLs, IADLs, and mobility. Pt still drives and works as a Engineer, maintenance (IT). Pt does not use oxygen at home and is currently on 6L [place]. Pt currently requires variable setup to min guard for self-care and functional transfer tasks. Pt able to ambulate to/from bathroom with RW and complete toileting task as well as grooming/hygiene tasks at the sink. Pt's SpO2 dropped to 68% during activity while on 6L [place] with pt reporting min shortness of breath. Pt required 2 min seated rest break for SpO2 to increase back to 95%. 2/4 DOE. Educated pt on relaxation strategies to address anxiety related to shortness of breath. Pt demonstrates decreased strength, endurance, balance, standing tolerance, and activity tolerance impacting ability to complete self-care and functional transfer tasks. Recommend skilled OT services to address above deficits in order to promote function and prevent further decline. Recommend [facility] OT for continued rehab following hospital discharge.      Follow Up Recommendations  Home health OT;Supervision - Intermittent    Equipment Recommendations  3 in 1 bedside commode(for use in the shower)    Recommendations for Other Services       Precautions / Restrictions Precautions Precautions: Fall Restrictions Weight Bearing Restrictions: No      Mobility Bed Mobility               General bed mobility comments: Pt sitting in chair upon OT arrival  Transfers Overall transfer level: Needs assistance Equipment used: Rolling walker (2 wheeled) Transfers: Sit to/from Stand Sit to Stand: Supervision         General transfer comment: Pt able to ambulate to/from bathroom  with RW and min guard, transferring on/off toilet with min guard.     Balance Overall balance assessment: Needs assistance   Sitting balance-Leahy Scale: Good       Standing balance-Leahy Scale: Fair                             ADL either performed or assessed with clinical judgement   ADL Overall ADL's : Needs assistance/impaired Eating/Feeding: Independent;Sitting   Grooming: Min guard;Standing   Upper Body Bathing: Set up;Sitting   Lower Body Bathing: Supervison/ safety;Sit to/from stand;Min guard   Upper Body Dressing : Set up;Sitting   Lower Body Dressing: Supervision/safety;Min guard;Sit to/from stand   Toilet Transfer: Nature conservation officer;Ambulation   Toileting- Clothing Manipulation and Hygiene: Min guard;Sit to/from stand       Functional mobility during ADLs: Min guard;Rolling walker General ADL Comments: Pt able to ambulate to/from bathroom with RW and min guard. Noted 0 instances of LOB, however pt unsteady on feet.     Vision Baseline Vision/History: Wears glasses Wears Glasses: At all times       Perception     Praxis      Pertinent Vitals/Pain Pain Assessment: No/denies pain     Hand Dominance Left   Extremity/Trunk Assessment Upper Extremity Assessment Upper Extremity Assessment: Overall WFL for tasks assessed   Lower Extremity Assessment Lower Extremity Assessment: Defer to PT evaluation       Communication Communication Communication: No difficulties   Cognition Arousal/Alertness: Awake/alert Behavior During Therapy:  WFL for tasks assessed/performed Overall Cognitive Status: Within Functional Limits for tasks assessed                                     General Comments  Educated pt on safety strategies, energy conservation techniques, and activity modifications. Pt on 6L [place] with O2 SATs at 94% at start of session. Pt's O2 SATs dropped to 68% during activity with pt reporting min SOB. Pt required 2  min seated rest break for O2 SATs to increase back 95% on 6L [place].      Exercises Exercises: Other exercises Other Exercises Other Exercises: Incentive spirometer x 10 with min cues on technique. Pulling 733mL Other Exercises: Flutter valve x 10 with min cues on technique   Shoulder Instructions      Home Living Family/patient expects to be discharged to:: Private residence Living Arrangements: Spouse/significant other Available Help at Discharge: Family;Available 24 hours/day Type of Home: Mobile home Home Access: Stairs to enter Entrance Stairs-Number of Steps: 8 Entrance Stairs-Rails: Can reach both Home Layout: One level     Bathroom Shower/Tub: Occupational psychologist: Handicapped height     Home Equipment: None   Additional Comments: Pt may have a RW at home. Pt reports that wife is in good health and can assist him at home.      Prior Functioning/Environment Level of Independence: Independent        Comments: Pt independent in ADLs, IADLs, and mobility. Pt does not ambulate with an assistive device. Pt still drives and works as a Engineer, maintenance (IT). Pt reports 0 falls in the last 6 months. Pt does not use O2 at home.        OT Problem List: Decreased strength;Decreased activity tolerance;Impaired balance (sitting and/or standing);Decreased knowledge of use of DME or AE;Cardiopulmonary status limiting activity      OT Treatment/Interventions: Self-care/ADL training;Therapeutic exercise;Neuromuscular education;Energy conservation;DME and/or AE instruction;Therapeutic activities;Patient/family education;Balance training    OT Goals(Current goals can be found in the care plan section) Acute Rehab OT Goals Patient Stated Goal: To go home and get back to work Time For Goal Achievement: 10/31/19 Potential to Achieve Goals: Good ADL Goals Pt Will Perform Grooming: with modified independence;standing Pt Will Perform Lower Body Bathing: with modified independence;sit to/from  stand Pt Will Perform Lower Body Dressing: with modified independence;sit to/from stand Pt Will Transfer to Toilet: with modified independence;ambulating;regular height toilet Pt Will Perform Toileting - Clothing Manipulation and hygiene: with modified independence;sit to/from stand Additional ADL Goal #1: Pt to recall and demonstrate breathing exercises with 0 verbal cues on technique.  OT Frequency: Min 3X/week   Barriers to D/C:            Co-evaluation              AM-PAC OT "6 Clicks" Daily Activity     Outcome Measure Help from another person eating meals?: None Help from another person taking care of personal grooming?: A Little Help from another person toileting, which includes using toliet, bedpan, or urinal?: A Little Help from another person bathing (including washing, rinsing, drying)?: A Little Help from another person to put on and taking off regular upper body clothing?: A Little Help from another person to put on and taking off regular lower body clothing?: A Little 6 Click Score: 19   End of Session Equipment Utilized During Treatment: Rolling walker;Oxygen Nurse Communication: Mobility status  Activity Tolerance: Patient limited by fatigue(Limited by SOB) Patient left: in chair;with call bell/phone within reach  OT Visit Diagnosis: Unsteadiness on feet (R26.81);Muscle weakness (generalized) (M62.81)                Time: [id number] OT Time Calculation (min): 50 min Charges:  OT General Charges $OT Visit: 1 Visit OT Evaluation $OT Eval Moderate Complexity: 1 Mod OT Treatments $Self Care/Home Management : 8-22 mins $Therapeutic Exercise: 8-22 mins  Mauri Brooklyn OTR/L [phone]   Mauri Brooklyn 10/17/2019, 9:09 AM

## 2019-10-18 LAB — CBC WITH DIFFERENTIAL/PLATELET
Abs Immature Granulocytes: 0.9 10*3/uL — ABNORMAL HIGH (ref 0.00–0.07)
Basophils Absolute: 0.1 10*3/uL (ref 0.0–0.1)
Basophils Relative: 0 %
Eosinophils Absolute: 0 10*3/uL (ref 0.0–0.5)
Eosinophils Relative: 0 %
HCT: 39.4 % (ref 39.0–52.0)
Hemoglobin: 12.5 g/dL — ABNORMAL LOW (ref 13.0–17.0)
Immature Granulocytes: 5 %
Lymphocytes Relative: 5 %
Lymphs Abs: 1 10*3/uL (ref 0.7–4.0)
MCH: 25.1 pg — ABNORMAL LOW (ref 26.0–34.0)
MCHC: 31.7 g/dL (ref 30.0–36.0)
MCV: 79 fL — ABNORMAL LOW (ref 80.0–100.0)
Monocytes Absolute: 1.2 10*3/uL — ABNORMAL HIGH (ref 0.1–1.0)
Monocytes Relative: 6 %
Neutro Abs: 16.3 10*3/uL — ABNORMAL HIGH (ref 1.7–7.7)
Neutrophils Relative %: 84 %
Platelets: 398 10*3/uL (ref 150–400)
RBC: 4.99 MIL/uL (ref 4.22–5.81)
RDW: 17.1 % — ABNORMAL HIGH (ref 11.5–15.5)
WBC: 19.5 10*3/uL — ABNORMAL HIGH (ref 4.0–10.5)
nRBC: 0.2 % (ref 0.0–0.2)

## 2019-10-18 LAB — GLUCOSE, CAPILLARY
Glucose-Capillary: 219 mg/dL — ABNORMAL HIGH (ref 70–99)
Glucose-Capillary: 237 mg/dL — ABNORMAL HIGH (ref 70–99)
Glucose-Capillary: 263 mg/dL — ABNORMAL HIGH (ref 70–99)
Glucose-Capillary: 308 mg/dL — ABNORMAL HIGH (ref 70–99)
Glucose-Capillary: 379 mg/dL — ABNORMAL HIGH (ref 70–99)
Glucose-Capillary: 435 mg/dL — ABNORMAL HIGH (ref 70–99)

## 2019-10-18 LAB — BASIC METABOLIC PANEL
Anion gap: 13 (ref 5–15)
BUN: 58 mg/dL — ABNORMAL HIGH (ref 8–23)
CO2: 26 mmol/L (ref 22–32)
Calcium: 9.9 mg/dL (ref 8.9–10.3)
Chloride: 96 mmol/L — ABNORMAL LOW (ref 98–111)
Creatinine, Ser: 1.55 mg/dL — ABNORMAL HIGH (ref 0.61–1.24)
GFR calc Af Amer: 50 mL/min — ABNORMAL LOW (ref >60)
GFR calc non Af Amer: 43 mL/min — ABNORMAL LOW (ref >60)
Glucose, Bld: 232 mg/dL — ABNORMAL HIGH (ref 70–99)
Potassium: 5 mmol/L (ref 3.5–5.1)
Sodium: 135 mmol/L (ref 135–145)

## 2019-10-18 LAB — D-DIMER, QUANTITATIVE: D-Dimer, Quant: 5.82 ug/mL-FEU — ABNORMAL HIGH (ref 0.00–0.50)

## 2019-10-18 LAB — C-REACTIVE PROTEIN: CRP: 1.1 mg/dL — ABNORMAL HIGH (ref <1.0)

## 2019-10-18 LAB — HEPARIN LEVEL (UNFRACTIONATED): Heparin Unfractionated: 1.22 IU/mL — ABNORMAL HIGH (ref 0.30–0.70)

## 2019-10-18 MED ORDER — AMLODIPINE BESYLATE 10 MG PO TABS
10.0000 mg | ORAL_TABLET | Freq: Every day | ORAL | Status: DC
  Administered 2019-10-19 – 2019-10-23 (×5): 10 mg via ORAL
  Filled 2019-10-18 (×5): qty 1

## 2019-10-18 NOTE — Progress Notes (Signed)
Physical Therapy Treatment Patient Details Name: Matthew Bates MRN: [id number] DOB: [birth year] Today's Date: 10/18/2019    History of Present Illness Pt is a 73 y.o. male admitted 10/09/19 with dyspnea. (+) COVID-19. Found to have BLE DVTs. PMH includes HTN, DM2, CAD, CKD.   PT Comments    Pt progressing well with mobility. Able to increased ambulation distance, requiring intermittent sitting rest breaks to recover as SpO2 down to 74% on 6-8L HFNC with mobility, returning to >/88% with 2-3 min seated rest and pursed lip breathing. Pt motivated to participate and regain PLOF. Educ on seated/standing therex to perform daily.    Follow Up Recommendations  Home health PT;Supervision for mobility/OOB     Equipment Recommendations  3in1 (PT)    Recommendations for Other Services       Precautions / Restrictions Precautions Precautions: Fall Restrictions Weight Bearing Restrictions: No    Mobility  Bed Mobility               General bed mobility comments: Seated in recliner  Transfers Overall transfer level: Modified independent Equipment used: Rolling walker (2 wheeled) Transfers: Sit to/from Stand              Ambulation/Gait Ambulation/Gait assistance: Supervision Gait Distance (Feet): 200 Feet Assistive device: Rolling walker (2 wheeled);None Gait Pattern/deviations: Step-through pattern;Decreased stride length   Gait velocity interpretation: 1.31 - 2.62 ft/sec, indicative of limited community ambulator General Gait Details: Amb 64' + 80' + 80' in room with RW at supervision-level, pt requesting use of RW although carrying it periodically. Requires prolonged seated rest between gait distances to recover, SpO2 down to 74% on 6L HFNC, returning to >/88% with 2-3 min seated rest. Last gait distance on 8L HFNC, SpO2 still down to 70s   Stairs             Wheelchair Mobility    Modified Rankin (Stroke Patients Only)       Balance Overall balance  assessment: Needs assistance   Sitting balance-Leahy Scale: Good       Standing balance-Leahy Scale: Fair                              Cognition Arousal/Alertness: Awake/alert Behavior During Therapy: WFL for tasks assessed/performed Overall Cognitive Status: Within Functional Limits for tasks assessed                                        Exercises      General Comments General comments (skin integrity, edema, etc.): Pt has been transferring to [facility] independently. Encouraged hourly seated/standing LE therex with seated rest to recover. Pt feels comfortable using IS/flutter valve      Pertinent Vitals/Pain Pain Assessment: No/denies pain    Home Living                      Prior Function            PT Goals (current goals can now be found in the care plan section) Progress towards PT goals: Progressing toward goals    Frequency    Min 3X/week      PT Plan Current plan remains appropriate    Co-evaluation              AM-PAC PT "6 Clicks" Mobility   Outcome Measure  Help needed  turning from your back to your side while in a flat bed without using bedrails?: None Help needed moving from lying on your back to sitting on the side of a flat bed without using bedrails?: None Help needed moving to and from a bed to a chair (including a wheelchair)?: None Help needed standing up from a chair using your arms (e.g., wheelchair or bedside chair)?: None Help needed to walk in hospital room?: A Little Help needed climbing 3-5 steps with a railing? : A Little 6 Click Score: 22    End of Session Equipment Utilized During Treatment: Oxygen Activity Tolerance: Patient tolerated treatment well Patient left: in chair;with call bell/phone within reach Nurse Communication: Mobility status PT Visit Diagnosis: Other abnormalities of gait and mobility (R26.89)     Time: 1358-1430 PT Time Calculation (min) (ACUTE ONLY): 32  min  Charges:  $Therapeutic Exercise: 23-37 mins                    Mabeline Caras, PT, DPT Acute Rehabilitation Services  Pager [phone] Office [place] 10/18/2019, 4:43 PM

## 2019-10-18 NOTE — Progress Notes (Signed)
Inpatient Diabetes Program Recommendations  AACE/ADA: New Consensus Statement on Inpatient Glycemic Control (2015)  Target Ranges:  Prepandial:   less than 140 mg/dL      Peak postprandial:   less than 180 mg/dL (1-2 hours)      Critically ill patients:  140 - 180 mg/dL   Lab Results  Component Value Date   GLUCAP 237 (H) 10/18/2019   HGBA1C 7.2 (H) 10/09/2019    Review of Glycemic Control  CBGs today: 219, 237. Yesterday had a post-prandial of 402 mg/dL.  Inpatient Diabetes Program Recommendations:     Increase Novolog to 5 units tidwc.  Will continue to follow.   Thank you. Lorenda Peck, RD, LDN, CDE Inpatient Diabetes Coordinator [phone]

## 2019-10-18 NOTE — Progress Notes (Signed)
PROGRESS NOTE    Matthew Bates  [id number] DOB: 1946-02-14 DOA: 10/09/2019 PCP: Marton Redwood, MD    Brief Narrative:  73 year old male who presented with dyspnea.  He does have significant past medical history for hypertension, dyslipidemia, type 2 diabetes mellitus, coronary artery disease and chronic kidney disease.  Patient presented with shortness of breath.  Noted to be hypoxic.  He was positive for COVID-19.  He was hospitalized for further management.  Chest x-ray showed interstitial infiltrates.      Assessment & Plan:  Acute hypoxic respiratory failure due to SARS COVID-19 viral pneumonia.   COVID-19 Labs  Recent Labs  Lab 10/12/19 0355 10/13/19 0205 10/14/19 0200 10/15/19 0125 10/16/19 0138 10/17/19 0510 10/18/19 0430  DDIMER 1.54* 1.68* 8.02* >20.00* >20.00* 11.20* 5.82*  FERRITIN 384* 369* 385*  --   --   --   --   CRP 13.8* 8.4* 5.1*  --  1.8* 0.9 1.1*  ALT 41 42 49*  --   --   --   --   PROCALCITON  --   --   --   --  <0.10  --   --    Positive COVID-19 test result from 10/09/2019.  Patient noted to be stable for the most part.  Remains on 5 to 6 L of oxygen by nasal cannula.  Saturations in the early 90s.  Hemoptysis is improved per patient.  D-dimer improved to 5.82.  CRP 1.1.Elevated WBC is due to steroids.  Patient has completed course of remdesivir.  He remains on steroids which is being tapered down.  He was given Actemra and convalescent plasma as well.  He was also given Lasix for a few days.  Continue with incentive spirometry and mobilization.  Hemoptysis Most likely due to the viral pneumonia as well as suspected PE.  Monitor closely while he is getting anticoagulated.  Hemoptysis is improving per patient.  Acute DVT involving bilateral lower extremity veins/suspected acute pulmonary embolism Noted to have a DVT in both his legs on venous Doppler.  Due to his high oxygen requirements and hemoptysis PE is also high on differential.  Cannot do CT  angiogram as his creatinine is elevated.  Now that he is positive for DVT we did not need to pursue the CT angiogram as it will not change management.  IV heparin was changed over to Lovenox yesterday.  If everything remains stable anticipate changing to oral agents tomorrow.    Acute on chronic diastolic heart failure Echocardiogram with preserved LV systolic function EF 45 to 50% with no wall motion abnormalities.  He has received Lasix intermittently..  Held since 12/12 due to elevated creatinine.  Monitor urine output.  Ins and outs.  Renal function is stable.  AKI on CKD stage 2 Baseline creatinine around 1.5.  Renal function is stable.  Continue to monitor electrolytes.  Diabetes mellitus type 2, uncontrolled with hyperglycemia HbA1c 7.2.  Elevated CBG most likely due to steroids.  Metformin is on hold.  Continue SSI.  Continue Levemir.  Should improve as steroid is tapered down.  Essential hypertension High blood pressure readings noted.  May consider increasing dose of amlodipine.  Elevated blood pressure could be due to steroids.  Should improve as it is tapered down.  ARB is on hold.  Mild transaminitis Likely due to COVID-19.  Check in the outpatient setting in a few weeks.  Coronary artery disease and dyslipidemia Stable.  Continue current medications.  No chest pain.  Continue aspirin.  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  Can go back to his outpatient providers for further management.    DVT prophylaxis: enoxaparin  Code Status:  Full Code Family Communication: Wife being updated on a daily basis Disposition Plan: Not ready for discharge due to tenuous respiratory status.     Consultants:   Cardiology   Procedures:  Transthoracic echocardiogram 10/10/2019  1. Left ventricular ejection fraction, by visual estimation, is 45 to 50%. The left ventricle has mildly decreased function. There is no left ventricular hypertrophy.  2. Elevated left ventricular end-diastolic  pressure.  3. Left ventricular diastolic parameters are indeterminate.  4. Global right ventricle has mildly reduced systolic function.The right ventricular size is mildly enlarged. Right vetricular wall thickness was not assessed.  5. Left atrial size was not assessed.  6. Right atrial size was not assessed.  7. The mitral valve is normal in structure. Mild mitral valve regurgitation.  8. The tricuspid valve is normal in structure. Tricuspid valve regurgitation is trivial.  9. The aortic valve is abnormal. Aortic valve regurgitation is not visualized. Mild to moderate aortic valve sclerosis/calcification without any evidence of aortic stenosis. 10. The pulmonic valve was grossly normal. Pulmonic valve regurgitation is mild. 11. Normal pulmonary artery systolic pressure.   Antimicrobials:  Anti-infectives (From admission, onward)   Start     Dose/Rate Route Frequency Ordered Stop   10/12/19 1000  vancomycin (VANCOCIN) IVPB 1000 mg/200 mL premix  Status:  Discontinued     1,000 mg 200 mL/hr over 60 Minutes Intravenous Every 24 hours 10/11/19 0916 10/11/19 1731   10/11/19 1000  ceFEPIme (MAXIPIME) 2 g in sodium chloride 0.9 % 100 mL IVPB  Status:  Discontinued     2 g 200 mL/hr over 30 Minutes Intravenous Every 12 hours 10/11/19 0916 10/12/19 1407   10/11/19 0930  vancomycin (VANCOCIN) 1,750 mg in sodium chloride 0.9 % 500 mL IVPB     1,750 mg 250 mL/hr over 120 Minutes Intravenous  Once 10/11/19 0916 10/11/19 1430   10/10/19 1000  remdesivir 100 mg in sodium chloride 0.9 % 100 mL IVPB     100 mg 200 mL/hr over 30 Minutes Intravenous Daily 10/09/19 1709 10/13/19 1127   10/09/19 1930  cefTRIAXone (ROCEPHIN) 2 g in sodium chloride 0.9 % 100 mL IVPB  Status:  Discontinued     2 g 200 mL/hr over 30 Minutes Intravenous Every 24 hours 10/09/19 1844 10/11/19 0909   10/09/19 1930  azithromycin (ZITHROMAX) 500 mg in sodium chloride 0.9 % 250 mL IVPB  Status:  Discontinued     500 mg 250 mL/hr  over 60 Minutes Intravenous Every 24 hours 10/09/19 1844 10/11/19 1731   10/09/19 1800  remdesivir 200 mg in sodium chloride 0.9% 250 mL IVPB     200 mg 580 mL/hr over 30 Minutes Intravenous Once 10/09/19 1709 10/09/19 1838         Subjective: Patient sitting up in the chair.  States that he is feeling better.  Hemoptysis is better.  Denies any new complaints.  Objective: Vitals:   10/18/19 0309 10/18/19 0730 10/18/19 0941 10/18/19 1117  BP: (!) 147/67 (!) 158/68 (!) 163/59 (!) 165/60  Pulse: 65 86 76 79  Resp: 20 20  (!) 25  Temp: 97.8 F (36.6 C) 97.7 F (36.5 C)  97.8 F (36.6 C)  TempSrc: Oral Oral  Oral  SpO2: 96% 92%  91%  Weight:      Height:  Intake/Output Summary (Last 24 hours) at 10/18/2019 1318 Last data filed at 10/18/2019 1117 Gross per 24 hour  Intake 240 ml  Output 1550 ml  Net -1310 ml   Filed Weights   10/09/19 1900  Weight: 85.7 kg    Examination:   General appearance: Awake alert.  In no distress Resp: Normal effort at rest.  Coarse breath sounds bilaterally.  Few crackles at the bases.  No wheezing or rhonchi.   Cardio: S1-S2 is normal regular.  No S3-S4.  No rubs murmurs or bruit GI: Abdomen is soft.  Nontender nondistended.  Bowel sounds are present normal.  No masses organomegaly Extremities: No edema.  Full range of motion of lower extremities. Neurologic: Alert and oriented x3.  No focal neurological deficits.    Data Reviewed: I have personally reviewed following labs and imaging studies  CBC: Recent Labs  Lab 10/13/19 0205 10/14/19 0200 10-Nov-2019 0138 10/17/19 0510 10/18/19 0430  WBC 10.3 12.8* 17.6* 20.1* 19.5*  NEUTROABS 8.6* 11.1* 14.6* 16.6* 16.3*  HGB 11.2* 11.6* 12.8* 12.3* 12.5*  HCT 35.7* 36.9* 41.1 39.3 39.4  MCV 78.3* 78.8* 78.7* 78.4* 79.0*  PLT 331 353 395 425* 123456   Basic Metabolic Panel: Recent Labs  Lab 10/12/19 0355 10/13/19 0205 10/14/19 0200 10/15/19 0125 11/10/19 0138 10/17/19 0510  10/18/19 0430  NA 141 139 139 137 136 133* 135  K 3.5 3.5 3.6 3.6 3.9 4.7 5.0  CL 106 104 102 100 94* 94* 96*  CO2 21* 24 23 25 27 26 26   GLUCOSE 190* 177* 182* 239* 279* 238* 232*  BUN 35* 44* 46* 49* 52* 54* 58*  CREATININE 1.56* 1.39* 1.60* 1.66* 1.71* 1.56* 1.55*  CALCIUM 8.4* 8.3* 8.8* 8.8* 9.0 9.5 9.9  MG 2.0 2.2 2.1  --   --   --   --   PHOS 2.3* 2.6 2.5  --   --   --   --    GFR: Estimated Creatinine Clearance: 45.4 mL/min (A) (by C-G formula based on SCr of 1.55 mg/dL (H)). Liver Function Tests: Recent Labs  Lab 10/12/19 0355 10/13/19 0205 10/14/19 0200  AST 85* 72* 74*  ALT 41 42 49*  ALKPHOS 67 78 126  BILITOT 0.6 1.0 0.6  PROT 6.8 6.2* 6.3*  ALBUMIN 2.6* 2.6* 2.5*   CBG: Recent Labs  Lab 10/17/19 1135 10/17/19 1612 10/17/19 2006 10/18/19 0727 10/18/19 1119  GLUCAP 261* 366* 402* 219* 237*     Radiology Studies: LE Venous  Result Date: 11-10-19  Lower Venous Study Indications: Elevated d-dimer.  Comparison Study: Performing Technologist: Maudry Mayhew MHA, RDMS, RVT, RDCS  Examination Guidelines: A complete evaluation includes B-mode imaging, spectral Doppler, color Doppler, and power Doppler as needed of all accessible portions of each vessel. Bilateral testing is considered an integral part of a complete examination. Limited examinations for reoccurring indications may be performed as noted.  +---------+---------------+---------+-----------+----------+--------------+ RIGHT    CompressibilityPhasicitySpontaneityPropertiesThrombus Aging +---------+---------------+---------+-----------+----------+--------------+ CFV      Full           Yes      Yes                                 +---------+---------------+---------+-----------+----------+--------------+ SFJ      Full                                                        +---------+---------------+---------+-----------+----------+--------------+  FV Prox  Full                                                         +---------+---------------+---------+-----------+----------+--------------+ FV Mid   Full                                                        +---------+---------------+---------+-----------+----------+--------------+ FV DistalFull                                                        +---------+---------------+---------+-----------+----------+--------------+ PFV      Full                                                        +---------+---------------+---------+-----------+----------+--------------+ POP      Full           Yes      Yes                                 +---------+---------------+---------+-----------+----------+--------------+ PTV      None                    No                   Acute          +---------+---------------+---------+-----------+----------+--------------+ PERO     None                    No                   Acute          +---------+---------------+---------+-----------+----------+--------------+   +---------+---------------+---------+-----------+----------+--------------+ LEFT     CompressibilityPhasicitySpontaneityPropertiesThrombus Aging +---------+---------------+---------+-----------+----------+--------------+ CFV      Full           Yes      Yes                                 +---------+---------------+---------+-----------+----------+--------------+ SFJ      Full                                                        +---------+---------------+---------+-----------+----------+--------------+ FV Prox  Full                                                        +---------+---------------+---------+-----------+----------+--------------+  FV Mid   Full                                                        +---------+---------------+---------+-----------+----------+--------------+ FV DistalFull                                                         +---------+---------------+---------+-----------+----------+--------------+ PFV      Full                                                        +---------+---------------+---------+-----------+----------+--------------+ POP      Full           Yes      Yes                                 +---------+---------------+---------+-----------+----------+--------------+ PTV      Full                                                        +---------+---------------+---------+-----------+----------+--------------+ PERO     Full                                                        +---------+---------------+---------+-----------+----------+--------------+ Soleal   None                    No                   Acute          +---------+---------------+---------+-----------+----------+--------------+ Gastroc  None                    No                   Acute          +---------+---------------+---------+-----------+----------+--------------+  Summary: Right: Findings consistent with acute deep vein thrombosis involving the right posterior tibial veins, and right peroneal veins. Left: Findings consistent with acute deep vein thrombosis involving the left soleal veins, and left gastrocnemius veins. No cystic structure found in the popliteal fossa.  *See table(s) above for measurements and observations. Electronically signed by Servando Snare MD on 10/16/2019 at 10:18:42 AM.    Final         Scheduled Meds: . sodium chloride   Intravenous Once  . amLODipine  5 mg Oral Daily  . aspirin EC  81 mg Oral Daily  . cholecalciferol  1,000 Units Oral Daily  . dexamethasone (DECADRON) injection  4 mg Intravenous Q24H  . docusate sodium  100 mg Oral BID  .  enoxaparin (LOVENOX) injection  85 mg Subcutaneous Q12H  . famotidine  40 mg Oral BID  . feeding supplement (GLUCERNA SHAKE)  237 mL Oral TID BM  . fenofibrate  160 mg Oral Daily  . insulin aspart  0-15 Units Subcutaneous TID WC    . insulin aspart  0-5 Units Subcutaneous QHS  . insulin aspart  3 Units Subcutaneous TID WC  . insulin detemir  12 Units Subcutaneous Daily  . Ipratropium-Albuterol  4 puff Inhalation QID  . labetalol  100 mg Oral BID  . rosuvastatin  20 mg Oral Daily  . sodium chloride flush  3 mL Intravenous Q12H  . sodium chloride flush  3 mL Intravenous Q12H  . vitamin C  500 mg Oral Daily  . zinc sulfate  220 mg Oral Daily   Continuous Infusions: . sodium chloride       LOS: 9 days    Bonnielee Haff, MD Pager: [email or web address]

## 2019-10-19 LAB — CBC WITH DIFFERENTIAL/PLATELET
Abs Immature Granulocytes: 0.59 10*3/uL — ABNORMAL HIGH (ref 0.00–0.07)
Basophils Absolute: 0.1 10*3/uL (ref 0.0–0.1)
Basophils Relative: 0 %
Eosinophils Absolute: 0 10*3/uL (ref 0.0–0.5)
Eosinophils Relative: 0 %
HCT: 39.3 % (ref 39.0–52.0)
Hemoglobin: 12.3 g/dL — ABNORMAL LOW (ref 13.0–17.0)
Immature Granulocytes: 3 %
Lymphocytes Relative: 5 %
Lymphs Abs: 0.9 10*3/uL (ref 0.7–4.0)
MCH: 25 pg — ABNORMAL LOW (ref 26.0–34.0)
MCHC: 31.3 g/dL (ref 30.0–36.0)
MCV: 79.9 fL — ABNORMAL LOW (ref 80.0–100.0)
Monocytes Absolute: 1.3 10*3/uL — ABNORMAL HIGH (ref 0.1–1.0)
Monocytes Relative: 7 %
Neutro Abs: 14.5 10*3/uL — ABNORMAL HIGH (ref 1.7–7.7)
Neutrophils Relative %: 85 %
Platelets: 413 10*3/uL — ABNORMAL HIGH (ref 150–400)
RBC: 4.92 MIL/uL (ref 4.22–5.81)
RDW: 17.2 % — ABNORMAL HIGH (ref 11.5–15.5)
WBC: 17.3 10*3/uL — ABNORMAL HIGH (ref 4.0–10.5)
nRBC: 0.1 % (ref 0.0–0.2)

## 2019-10-19 LAB — GLUCOSE, CAPILLARY
Glucose-Capillary: 266 mg/dL — ABNORMAL HIGH (ref 70–99)
Glucose-Capillary: 286 mg/dL — ABNORMAL HIGH (ref 70–99)
Glucose-Capillary: 229 mg/dL — ABNORMAL HIGH (ref 70–99)
Glucose-Capillary: 312 mg/dL — ABNORMAL HIGH (ref 70–99)
Glucose-Capillary: 282 mg/dL — ABNORMAL HIGH (ref 70–99)

## 2019-10-19 LAB — POTASSIUM
Potassium: 6 mmol/L — ABNORMAL HIGH (ref 3.5–5.1)
Potassium: 5.6 mmol/L — ABNORMAL HIGH (ref 3.5–5.1)

## 2019-10-19 LAB — BASIC METABOLIC PANEL
Anion gap: 12 (ref 5–15)
BUN: 57 mg/dL — ABNORMAL HIGH (ref 8–23)
CO2: 26 mmol/L (ref 22–32)
Calcium: 9.5 mg/dL (ref 8.9–10.3)
Chloride: 95 mmol/L — ABNORMAL LOW (ref 98–111)
Creatinine, Ser: 1.65 mg/dL — ABNORMAL HIGH (ref 0.61–1.24)
GFR calc Af Amer: 47 mL/min — ABNORMAL LOW (ref >60)
GFR calc non Af Amer: 40 mL/min — ABNORMAL LOW (ref >60)
Glucose, Bld: 274 mg/dL — ABNORMAL HIGH (ref 70–99)
Potassium: 5.4 mmol/L — ABNORMAL HIGH (ref 3.5–5.1)
Sodium: 133 mmol/L — ABNORMAL LOW (ref 135–145)

## 2019-10-19 MED ORDER — SODIUM POLYSTYRENE SULFONATE 15 GM/60ML PO SUSP
45.0000 g | Freq: Once | ORAL | Status: AC
  Administered 2019-10-19: 45 g via ORAL
  Filled 2019-10-19: qty 180

## 2019-10-19 MED ORDER — SODIUM ZIRCONIUM CYCLOSILICATE 10 G PO PACK
10.0000 g | PACK | Freq: Once | ORAL | Status: AC
  Administered 2019-10-19: 10 g via ORAL
  Filled 2019-10-19: qty 1

## 2019-10-19 NOTE — Progress Notes (Signed)
PROGRESS NOTE    Matthew Bates  [id number] DOB: [birth year] DOA: 10/09/2019 PCP: Marton Redwood, MD    Brief Narrative:  73 year old male who presented with dyspnea.  He does have significant past medical history for hypertension, dyslipidemia, type 2 diabetes mellitus, coronary artery disease and chronic kidney disease.  Patient presented with shortness of breath.  Noted to be hypoxic.  He was positive for COVID-19.  He was hospitalized for further management.  Chest x-ray showed interstitial infiltrates.      Assessment & Plan:  Acute hypoxic respiratory failure due to SARS COVID-19 viral pneumonia.   COVID-19 Labs  Recent Labs  Lab 10/13/19 0205 10/14/19 0200 10/15/19 0125 10/16/19 0138 10/17/19 0510 10/18/19 0430  DDIMER 1.68* 8.02* >20.00* >20.00* 11.20* 5.82*  FERRITIN 369* 385*  --   --   --   --   CRP 8.4* 5.1*  --  1.8* 0.9 1.1*  ALT 42 49*  --   --   --   --   PROCALCITON  --   --   --  <0.10  --   --    Positive COVID-19 test result from 10/09/2019.  Patient remained stable.  He is now on 4 L of oxygen by nasal cannula.  Saturating in the early 90s.  Hemoptysis has improved.  D-dimer improved to 5.82 yesterday.  Elevated WBC due to steroids.  CRP had improved to 1.1.  Continue to mobilize.  Patient has completed course of remdesivir.  He remains on steroids which is being tapered down.  He was given Actemra and convalescent plasma as well.  He was also given Lasix for a few days.  Continue with incentive spirometry and mobilization.  Hemoptysis Most likely due to the viral pneumonia as well as suspected PE.  Monitor closely while he is getting anticoagulated.  Hemoptysis is improving per patient.  Acute DVT involving bilateral lower extremity veins/suspected acute pulmonary embolism Noted to have a DVT in both his legs on venous Doppler.  Due to his high oxygen requirements and hemoptysis PE is also high on differential.  Could not do CT angiogram as his  creatinine was elevated.  Now that he is positive for DVT we did not need to pursue the CT angiogram as it will not change management.  IV heparin was changed over to Lovenox.  Will remain on Lovenox for another 24 hours.  Could be transitioned to oral anticoagulants tomorrow or the day after.  Acute on chronic diastolic heart failure Echocardiogram with preserved LV systolic function EF 45 to 50% with no wall motion abnormalities.  He has received Lasix intermittently..  Held since 12/12 due to elevated creatinine.  Continue to monitor ins and outs and daily weights.  AKI on CKD stage 2/hyperkalemia Baseline creatinine around 1.5.  Renal function is stable.  Continue to monitor electrolytes.  Hyperkalemia noted this morning. Will give him a dose of Lokelma.  Diabetes mellitus type 2, uncontrolled with hyperglycemia HbA1c 7.2.  Elevated CBG most likely due to steroids.  Metformin is on hold.  Continue SSI.  Continue Levemir.  Should improve as steroid is tapered down.  Essential hypertension Blood pressure noted to be elevated at times.  Dose of amlodipine was increased.  Continue to monitor.  ARB is on hold.    Mild transaminitis Likely due to COVID-19.  Check in the outpatient setting in a few weeks.  Coronary artery disease and dyslipidemia Stable.  Continue current medications.  No chest pain.  Continue aspirin.  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  Can go back to his outpatient providers for further management.    DVT prophylaxis: enoxaparin  Code Status:  Full Code Family Communication: Wife being updated on a daily basis Disposition Plan: Not ready for discharge due to tenuous respiratory status.     Consultants:   Cardiology   Procedures:  Transthoracic echocardiogram 10/10/2019  1. Left ventricular ejection fraction, by visual estimation, is 45 to 50%. The left ventricle has mildly decreased function. There is no left ventricular hypertrophy.  2. Elevated left  ventricular end-diastolic pressure.  3. Left ventricular diastolic parameters are indeterminate.  4. Global right ventricle has mildly reduced systolic function.The right ventricular size is mildly enlarged. Right vetricular wall thickness was not assessed.  5. Left atrial size was not assessed.  6. Right atrial size was not assessed.  7. The mitral valve is normal in structure. Mild mitral valve regurgitation.  8. The tricuspid valve is normal in structure. Tricuspid valve regurgitation is trivial.  9. The aortic valve is abnormal. Aortic valve regurgitation is not visualized. Mild to moderate aortic valve sclerosis/calcification without any evidence of aortic stenosis. 10. The pulmonic valve was grossly normal. Pulmonic valve regurgitation is mild. 11. Normal pulmonary artery systolic pressure.   Antimicrobials:  Anti-infectives (From admission, onward)   Start     Dose/Rate Route Frequency Ordered Stop   10/12/19 1000  vancomycin (VANCOCIN) IVPB 1000 mg/200 mL premix  Status:  Discontinued     1,000 mg 200 mL/hr over 60 Minutes Intravenous Every 24 hours 10/11/19 0916 10/11/19 1731   10/11/19 1000  ceFEPIme (MAXIPIME) 2 g in sodium chloride 0.9 % 100 mL IVPB  Status:  Discontinued     2 g 200 mL/hr over 30 Minutes Intravenous Every 12 hours 10/11/19 0916 10/12/19 1407   10/11/19 0930  vancomycin (VANCOCIN) 1,750 mg in sodium chloride 0.9 % 500 mL IVPB     1,750 mg 250 mL/hr over 120 Minutes Intravenous  Once 10/11/19 0916 10/11/19 1430   10/10/19 1000  remdesivir 100 mg in sodium chloride 0.9 % 100 mL IVPB     100 mg 200 mL/hr over 30 Minutes Intravenous Daily 10/09/19 1709 10/13/19 1127   10/09/19 1930  cefTRIAXone (ROCEPHIN) 2 g in sodium chloride 0.9 % 100 mL IVPB  Status:  Discontinued     2 g 200 mL/hr over 30 Minutes Intravenous Every 24 hours 10/09/19 1844 10/11/19 0909   10/09/19 1930  azithromycin (ZITHROMAX) 500 mg in sodium chloride 0.9 % 250 mL IVPB  Status:  Discontinued      500 mg 250 mL/hr over 60 Minutes Intravenous Every 24 hours 10/09/19 1844 10/11/19 1731   10/09/19 1800  remdesivir 200 mg in sodium chloride 0.9% 250 mL IVPB     200 mg 580 mL/hr over 30 Minutes Intravenous Once 10/09/19 1709 10/09/19 1838         Subjective: Patient sitting up in the chair.  Denies any complaints.  Shortness of breath is improving.  Denies nausea vomiting.  Hemoptysis is better.  Objective: Vitals:   10/19/19 0420 10/19/19 0718 10/19/19 1143 10/19/19 1154  BP: (!) 161/65 (!) 166/67 (!) 146/70   Pulse: 75 84 82   Resp: 18 20 (!) 22   Temp: 97.7 F (36.5 C) 97.7 F (36.5 C) 98.3 F (36.8 C)   TempSrc: Axillary Axillary Oral   SpO2: (!) 89% 90% 92% 90%  Weight:      Height:  Intake/Output Summary (Last 24 hours) at 10/19/2019 1308 Last data filed at 10/19/2019 1200 Gross per 24 hour  Intake 1423 ml  Output 1927 ml  Net -504 ml   Filed Weights   10/09/19 1900  Weight: 85.7 kg    Examination:   General appearance: Awake alert.  In no distress Resp: Improving air entry bilaterally.  Normal effort at rest.  Few crackles at the bases.  No wheezing or rhonchi.   Cardio: S1-S2 is normal regular.  No S3-S4.  No rubs murmurs or bruit GI: Abdomen is soft.  Nontender nondistended.  Bowel sounds are present normal.  No masses organomegaly Extremities: No edema.  Full range of motion of lower extremities. Neurologic: Alert and oriented x3.  No focal neurological deficits.    Data Reviewed: I have personally reviewed following labs and imaging studies  CBC: Recent Labs  Lab 10/14/19 0200 10-25-2019 0138 10/17/19 0510 10/18/19 0430 10/19/19 0240  WBC 12.8* 17.6* 20.1* 19.5* 17.3*  NEUTROABS 11.1* 14.6* 16.6* 16.3* 14.5*  HGB 11.6* 12.8* 12.3* 12.5* 12.3*  HCT 36.9* 41.1 39.3 39.4 39.3  MCV 78.8* 78.7* 78.4* 79.0* 79.9*  PLT 353 395 425* 398 [id number]*   Basic Metabolic Panel: Recent Labs  Lab 10/13/19 0205 10/14/19 0200 10/15/19 0125  25-Oct-2019 0138 10/17/19 0510 10/18/19 0430 10/19/19 0240  NA 139 139 137 136 133* 135 133*  K 3.5 3.6 3.6 3.9 4.7 5.0 5.4*  CL 104 102 100 94* 94* 96* 95*  CO2 24 23 25 27 26 26 26   GLUCOSE 177* 182* 239* 279* 238* 232* 274*  BUN 44* 46* 49* 52* 54* 58* 57*  CREATININE 1.39* 1.60* 1.66* 1.71* 1.56* 1.55* 1.65*  CALCIUM 8.3* 8.8* 8.8* 9.0 9.5 9.9 9.5  MG 2.2 2.1  --   --   --   --   --   PHOS 2.6 2.5  --   --   --   --   --    GFR: Estimated Creatinine Clearance: 42.6 mL/min (A) (by C-G formula based on SCr of 1.65 mg/dL (H)). Liver Function Tests: Recent Labs  Lab 10/13/19 0205 10/14/19 0200  AST 72* 74*  ALT 42 49*  ALKPHOS 78 126  BILITOT 1.0 0.6  PROT 6.2* 6.3*  ALBUMIN 2.6* 2.5*   CBG: Recent Labs  Lab 10/18/19 2044 10/18/19 2126 10/19/19 0716 10/19/19 0938 10/19/19 1141  GLUCAP 435* 379* 266* 286* 229*     Radiology Studies: LE Venous  Result Date: 2019-10-25  Lower Venous Study Indications: Elevated d-dimer.  Comparison Study: Performing Technologist: Maudry Mayhew MHA, RDMS, RVT, RDCS  Examination Guidelines: A complete evaluation includes B-mode imaging, spectral Doppler, color Doppler, and power Doppler as needed of all accessible portions of each vessel. Bilateral testing is considered an integral part of a complete examination. Limited examinations for reoccurring indications may be performed as noted.  +---------+---------------+---------+-----------+----------+--------------+ RIGHT    CompressibilityPhasicitySpontaneityPropertiesThrombus Aging +---------+---------------+---------+-----------+----------+--------------+ CFV      Full           Yes      Yes                                 +---------+---------------+---------+-----------+----------+--------------+ SFJ      Full                                                        +---------+---------------+---------+-----------+----------+--------------+  FV Prox  Full                                                         +---------+---------------+---------+-----------+----------+--------------+ FV Mid   Full                                                        +---------+---------------+---------+-----------+----------+--------------+ FV DistalFull                                                        +---------+---------------+---------+-----------+----------+--------------+ PFV      Full                                                        +---------+---------------+---------+-----------+----------+--------------+ POP      Full           Yes      Yes                                 +---------+---------------+---------+-----------+----------+--------------+ PTV      None                    No                   Acute          +---------+---------------+---------+-----------+----------+--------------+ PERO     None                    No                   Acute          +---------+---------------+---------+-----------+----------+--------------+   +---------+---------------+---------+-----------+----------+--------------+ LEFT     CompressibilityPhasicitySpontaneityPropertiesThrombus Aging +---------+---------------+---------+-----------+----------+--------------+ CFV      Full           Yes      Yes                                 +---------+---------------+---------+-----------+----------+--------------+ SFJ      Full                                                        +---------+---------------+---------+-----------+----------+--------------+ FV Prox  Full                                                        +---------+---------------+---------+-----------+----------+--------------+  FV Mid   Full                                                        +---------+---------------+---------+-----------+----------+--------------+ FV DistalFull                                                         +---------+---------------+---------+-----------+----------+--------------+ PFV      Full                                                        +---------+---------------+---------+-----------+----------+--------------+ POP      Full           Yes      Yes                                 +---------+---------------+---------+-----------+----------+--------------+ PTV      Full                                                        +---------+---------------+---------+-----------+----------+--------------+ PERO     Full                                                        +---------+---------------+---------+-----------+----------+--------------+ Soleal   None                    No                   Acute          +---------+---------------+---------+-----------+----------+--------------+ Gastroc  None                    No                   Acute          +---------+---------------+---------+-----------+----------+--------------+  Summary: Right: Findings consistent with acute deep vein thrombosis involving the right posterior tibial veins, and right peroneal veins. Left: Findings consistent with acute deep vein thrombosis involving the left soleal veins, and left gastrocnemius veins. No cystic structure found in the popliteal fossa.  *See table(s) above for measurements and observations. Electronically signed by Servando Snare MD on 10/16/2019 at 10:18:42 AM.    Final         Scheduled Meds: . sodium chloride   Intravenous Once  . amLODipine  10 mg Oral Daily  . aspirin EC  81 mg Oral Daily  . cholecalciferol  1,000 Units Oral Daily  . dexamethasone (DECADRON) injection  4 mg Intravenous Q24H  . docusate sodium  100 mg Oral BID  .  enoxaparin (LOVENOX) injection  85 mg Subcutaneous Q12H  . famotidine  40 mg Oral BID  . feeding supplement (GLUCERNA SHAKE)  237 mL Oral TID BM  . fenofibrate  160 mg Oral Daily  . insulin aspart  0-15 Units Subcutaneous TID WC    . insulin aspart  0-5 Units Subcutaneous QHS  . insulin aspart  3 Units Subcutaneous TID WC  . insulin detemir  12 Units Subcutaneous Daily  . Ipratropium-Albuterol  4 puff Inhalation QID  . labetalol  100 mg Oral BID  . rosuvastatin  20 mg Oral Daily  . sodium chloride flush  3 mL Intravenous Q12H  . sodium chloride flush  3 mL Intravenous Q12H  . vitamin C  500 mg Oral Daily  . zinc sulfate  220 mg Oral Daily   Continuous Infusions: . sodium chloride       LOS: 10 days    Bonnielee Haff, MD Pager: [email or web address]

## 2019-10-19 NOTE — Progress Notes (Signed)
 Nutrition Follow-up  DOCUMENTATION CODES:   Not applicable  INTERVENTION:   Continue Glucerna Shake po TID, each supplement provides 220 kcal and 10 grams of protein  Pt receiving Magic cup BID with lunch and dinner, each supplement provides 290 kcal and 9 grams of protein, automatically on meal trays to optimize nutritional intake.    NUTRITION DIAGNOSIS:   Increased nutrient needs related to acute illness(COVID-19 pneumonia) as evidenced by estimated needs.  Being addressed via supplements  GOAL:   Patient will meet greater than or equal to 90% of their needs  Progressing  MONITOR:   PO intake, Supplement acceptance, Labs, Weight trends  REASON FOR ASSESSMENT:   Malnutrition Screening Tool    ASSESSMENT:   73 year old male who presented to the ED on 12/05 with cough and SOB. PMH of HTN, HLD, T2DM, CAD s/p CABG in 2010, CKD stage III, GERD. Pt found to be COVID-19 positive and admitted with COVID-19 pneumonia and NSTEMI.  Recorded po intake 25-100% of . PO intake improved\   Diet had been liberalized to Regular and Ensure ordered per discussion with MD and RD on 12/8. Noted on 12/9 diet changed to Carb Modified, Glucerna shakes. Noted no improvement in CBGs with change to diabetic diet and shake.    Since po intake improved, continue carb modified diet and shakes for now. If po intake declines on follow-up, recommend liberalizing back to REGULAR diet with ENSURE ENLIVE  Labs: sodium 133 (L), potassium 5.4 (H)-received Lokelma, Creatinine 1.65, BUN 57; CBGs 229-435 Meds: cholecalciferol, IV decadron, colace, ss novolog, novolog with meals, levemir, Vitamin C, zinc sulfate  Diet Order:   Diet Order            Diet Carb Modified Fluid consistency: Thin; Room service appropriate? Yes  Diet effective now              EDUCATION NEEDS:   No education needs have been identified at this time  Skin:  Skin Assessment: Reviewed RN Assessment  Last BM:   12/13  Height:   Ht Readings from Last 1 Encounters:  10/09/19 5\' 9"  (1.753 m)    Weight:   Wt Readings from Last 1 Encounters:  10/09/19 85.7 kg    Ideal Body Weight:  72.7 kg  BMI:  Body mass index is 27.91 kg/m.  Estimated Nutritional Needs:   Kcal:  2100-2300  Protein:  100-115 grams  Fluid:  >/= 2.0 L    Laritza Vokes MS, RDN, LDN, CNSC [phone] Pager  [phone] Weekend/On-Call Pager

## 2019-10-19 NOTE — Progress Notes (Signed)
Updated patient's wife Mariann Laster regarding his current status. She is aware that patient ambulated his room at least 75 feet while using walker. He did desaturate while ambulating and required an increase in HFNC to 8L. Patient experienced no episodes of dyspnea while ambulating.

## 2019-10-20 LAB — BASIC METABOLIC PANEL
Anion gap: 13 (ref 5–15)
BUN: 50 mg/dL — ABNORMAL HIGH (ref 8–23)
CO2: 24 mmol/L (ref 22–32)
Calcium: 9.7 mg/dL (ref 8.9–10.3)
Chloride: 98 mmol/L (ref 98–111)
Creatinine, Ser: 1.64 mg/dL — ABNORMAL HIGH (ref 0.61–1.24)
GFR calc Af Amer: 47 mL/min — ABNORMAL LOW (ref >60)
GFR calc non Af Amer: 41 mL/min — ABNORMAL LOW (ref >60)
Glucose, Bld: 191 mg/dL — ABNORMAL HIGH (ref 70–99)
Potassium: 4.9 mmol/L (ref 3.5–5.1)
Sodium: 135 mmol/L (ref 135–145)

## 2019-10-20 LAB — GLUCOSE, CAPILLARY
Glucose-Capillary: 329 mg/dL — ABNORMAL HIGH (ref 70–99)
Glucose-Capillary: 168 mg/dL — ABNORMAL HIGH (ref 70–99)
Glucose-Capillary: 240 mg/dL — ABNORMAL HIGH (ref 70–99)
Glucose-Capillary: 308 mg/dL — ABNORMAL HIGH (ref 70–99)
Glucose-Capillary: 314 mg/dL — ABNORMAL HIGH (ref 70–99)
Glucose-Capillary: 273 mg/dL — ABNORMAL HIGH (ref 70–99)

## 2019-10-20 MED ORDER — SALINE SPRAY 0.65 % NA SOLN
1.0000 | NASAL | Status: DC | PRN
  Administered 2019-10-20 – 2019-10-21 (×3): 1 via NASAL
  Filled 2019-10-20: qty 44

## 2019-10-20 MED ORDER — INSULIN DETEMIR 100 UNIT/ML ~~LOC~~ SOLN
16.0000 [IU] | Freq: Every day | SUBCUTANEOUS | Status: DC
  Administered 2019-10-21: 16 [IU] via SUBCUTANEOUS
  Filled 2019-10-20 (×2): qty 0.16

## 2019-10-20 MED ORDER — OXYMETAZOLINE HCL 0.05 % NA SOLN
2.0000 | Freq: Two times a day (BID) | NASAL | Status: DC | PRN
  Administered 2019-10-20 – 2019-10-22 (×3): 2 via NASAL
  Filled 2019-10-20: qty 15

## 2019-10-20 MED ORDER — INSULIN ASPART 100 UNIT/ML ~~LOC~~ SOLN
6.0000 [IU] | Freq: Three times a day (TID) | SUBCUTANEOUS | Status: DC
  Administered 2019-10-20 – 2019-10-22 (×5): 6 [IU] via SUBCUTANEOUS

## 2019-10-20 NOTE — Progress Notes (Addendum)
ANTICOAGULATION CONSULT NOTE   Pharmacy Consult for enoxaparin Indication: DVT   Patient Measurements: Height: 5\' 9"  (175.3 cm) Weight: 189 lb (85.7 kg) IBW/kg (Calculated) : 70.7 Heparin Dosing Weight: 86 kg  Vital Signs: Temp: 97.6 F (36.4 C) (12/16 0813) Temp Source: Oral (12/16 0813) BP: 156/84 (12/16 0813) Pulse Rate: 73 (12/16 0813)  Labs: Recent Labs    10/17/19 1810 10/18/19 0430 10/19/19 0240 10/20/19 0010  HGB  --  12.5* 12.3*  --   HCT  --  39.4 39.3  --   PLT  --  398 413*  --   APTT 29  --   --   --   HEPARINUNFRC  --  1.22*  --   --   CREATININE  --  1.55* 1.65* 1.64*    Estimated Creatinine Clearance: 42.9 mL/min (A) (by C-G formula based on SCr of 1.64 mg/dL (H)).   Infusions:  . sodium chloride      Assessment: Pt was admitted for COVID and has finished his course of remdesivir. He had been on appropriate therapy for his DVT prophylaxis since admission.   12/11 Doppler showed b/l acute vein thromboses and MD also suspects pulmonary embolus - heparin IV started per pharmacy. 12/13 Heparin was converted to enoxaparin 1 mg/kg (85 mg) q12h.  Today 10/20/2019: Continues on enoxaparin 85 mg q12h. Incidental heparin level apparently drawn 12/14 SCr elevated 1.64 but near patient's baseline of ~1.5. Est CrCl 43 mL/min H/H and Pltc stable Hemoptysis reportedly improving Per MD notes may be converting to oral anticoagulation today or tomorrow   Plan:  Continue enoxaparin for now Await further word on potential transition to oral anticoagulation  Clayburn Pert, PharmD, BCPS 10/20/2019  8:37 AM

## 2019-10-20 NOTE — Progress Notes (Signed)
Matthew Bates  [id number] DOB: 1946/03/02 DOA: 10/09/2019 PCP: Marton Redwood, MD    Brief Narrative:  73 year old with a history of HTN, HLD, DM2, CAD, and CKD who presented to the ED with shortness of breath and was found to be hypoxic and Covid positive. A CXR noted bilateral interstitial infiltrates.  Significant Events:   COVID-19 specific Treatment: Remdesivir Steroid Convalescent plasma Actemra  Subjective: The patient feels that he is less short of breath today.  He denies chest pain.  He feels that his sense of taste is returning though he does not have much appetite.  Assessment & Plan:  Covid pneumonia -acute hypoxic respiratory failure Has completed a course of remdesivir -remains on steroid for a 10-day course -dosed with Actemra and convalescent plasma -continue to use Lasix intermittently as required -inflammatory markers trending favorably  Recent Labs  Lab 10/14/19 0200 10/15/19 0125 10/16/19 0138 10/17/19 0510 10/18/19 0430  DDIMER 8.02* >20.00* >20.00* 11.20* 5.82*  FERRITIN 385*  --   --   --   --   CRP 5.1*  --  1.8* 0.9 1.1*  ALT 49*  --   --   --   --   PROCALCITON  --   --  <0.10  --   --     Hemoptysis Felt to be due to pneumonitis as well as probable PE -essentially resolved at this time  Acute bilateral lower extremity DVT - clinically suspected pulmonary embolism Venous duplex confirmed bilateral lower extremity Dopplers -CT angio not able to be accomplished due to renal insufficiency -continue full dose Lovenox and transition to oral anticoagulation in next 24-48 hours  Acute on chronic diastolic CHF TTE noted EF 45-50% with no wall motion abnormalities -being dosed with Lasix intermittently with close monitoring of renal function  AKI on CKD stage II Baseline creatinine approximately 1.5  Hyperkalemia Corrected with treatment  Uncontrolled DM2 with hyperglycemia CBG not yet at goal -adjust treatment again today and follow  HTN  Follow blood pressure trend  Transaminitis Recheck in a.m. -felt to be due to Covid itself  CAD  HLD  Nasal skin cancer  DVT prophylaxis: Lovenox Code Status: FULL CODE Family Communication:  Disposition Plan:   Consultants:  Cardiology  Antimicrobials:  None presently  Objective: Blood pressure (!) 156/84, pulse 73, temperature 97.6 F (36.4 C), temperature source Oral, resp. rate 16, height 5\' 9"  (1.753 m), weight 85.7 kg, SpO2 93 %.  Intake/Output Summary (Last 24 hours) at 10/20/2019 0948 Last data filed at 10/20/2019 0100 Gross per 24 hour  Intake 1363 ml  Output 1225 ml  Net 138 ml   Filed Weights   10/09/19 1900  Weight: 85.7 kg    Examination: General: No acute respiratory distress Lungs: Crackles bilateral bases Cardiovascular: Regular rate and rhythm without murmur gallop or rub normal S1 and S2 Abdomen: Nontender, nondistended, soft, bowel sounds positive, no rebound, no ascites, no appreciable mass Extremities: Trace bilateral lower extremity edema  CBC: Recent Labs  Lab 10/17/19 0510 10/18/19 0430 10/19/19 0240  WBC 20.1* 19.5* 17.3*  NEUTROABS 16.6* 16.3* 14.5*  HGB 12.3* 12.5* 12.3*  HCT 39.3 39.4 39.3  MCV 78.4* 79.0* 79.9*  PLT 425* 398 [id number]*   Basic Metabolic Panel: Recent Labs  Lab 10/14/19 0200 10/18/19 0430 10/19/19 0240 10/19/19 1545 10/19/19 2200 10/20/19 0010  NA 139 135 133*  --   --  135  K 3.6 5.0 5.4* 6.0* 5.6* 4.9  CL 102 96* 95*  --   --  98  CO2 23 26 26   --   --  24  GLUCOSE 182* 232* 274*  --   --  191*  BUN 46* 58* 57*  --   --  50*  CREATININE 1.60* 1.55* 1.65*  --   --  1.64*  CALCIUM 8.8* 9.9 9.5  --   --  9.7  MG 2.1  --   --   --   --   --   PHOS 2.5  --   --   --   --   --    GFR: Estimated Creatinine Clearance: 42.9 mL/min (A) (by C-G formula based on SCr of 1.64 mg/dL (H)).  Liver Function Tests: Recent Labs  Lab 10/14/19 0200  AST 74*  ALT 49*  ALKPHOS 126  BILITOT 0.6  PROT 6.3*   ALBUMIN 2.5*    HbA1C: Hgb A1c MFr Bld  Date/Time Value Ref Range Status  10/09/2019 05:41 PM 7.2 (H) 4.8 - 5.6 % Final    Comment:    (NOTE) Pre diabetes:          5.7%-6.4% Diabetes:              >6.4% Glycemic control for   <7.0% adults with diabetes   03/20/2009 03:54 PM (H) 4.6 - 6.1 % Final   6.6 (NOTE) The ADA recommends the following therapeutic goal for glycemic control related to Hgb A1c measurement: Goal of therapy: <6.5 Hgb A1c  Reference: American Diabetes Association: Clinical Practice Recommendations 2010, Diabetes Care, 2010, 33: (Suppl  1).    CBG: Recent Labs  Lab 10/19/19 0938 10/19/19 1141 10/19/19 1632 10/19/19 2102 10/20/19 0727  GLUCAP 286* 229* 312* 282* 168*    Recent Results (from the past 240 hour(s))  Culture, blood (routine x 2)     Status: None   Collection Time: 10/11/19  6:44 AM   Specimen: BLOOD RIGHT WRIST  Result Value Ref Range Status   Specimen Description BLOOD RIGHT WRIST  Final   Special Requests   Final    BOTTLES DRAWN AEROBIC AND ANAEROBIC Blood Culture adequate volume   Culture   Final    NO GROWTH 5 DAYS Performed at [facility], [place] [address] [place], [place] [address]    Report Status 10/16/2019 FINAL  Final  Culture, blood (routine x 2)     Status: None   Collection Time: 10/11/19  6:45 AM   Specimen: BLOOD LEFT HAND  Result Value Ref Range Status   Specimen Description BLOOD LEFT HAND  Final   Special Requests   Final    BOTTLES DRAWN AEROBIC AND ANAEROBIC Blood Culture results may not be optimal due to an inadequate volume of blood received in culture bottles   Culture   Final    NO GROWTH 5 DAYS Performed at [facility], [place] [address], [place], [place] [address]    Report Status 10/16/2019 FINAL  Final     Scheduled Meds: . sodium chloride   Intravenous Once  . amLODipine  10 mg Oral Daily  . aspirin EC  81 mg Oral Daily  . cholecalciferol  1,000 Units Oral Daily  . dexamethasone  (DECADRON) injection  4 mg Intravenous Q24H  . docusate sodium  100 mg Oral BID  . enoxaparin (LOVENOX) injection  85 mg Subcutaneous Q12H  . famotidine  40 mg Oral BID  . feeding supplement (GLUCERNA SHAKE)  237 mL Oral TID BM  . fenofibrate  160 mg Oral  Daily  . insulin aspart  0-15 Units Subcutaneous TID WC  . insulin aspart  0-5 Units Subcutaneous QHS  . insulin aspart  3 Units Subcutaneous TID WC  . insulin detemir  12 Units Subcutaneous Daily  . Ipratropium-Albuterol  4 puff Inhalation QID  . labetalol  100 mg Oral BID  . rosuvastatin  20 mg Oral Daily  . sodium chloride flush  3 mL Intravenous Q12H  . sodium chloride flush  3 mL Intravenous Q12H  . vitamin C  500 mg Oral Daily  . zinc sulfate  220 mg Oral Daily   Continuous Infusions: . sodium chloride       LOS: 11 days   Cherene Altes, MD [facility] Office  [phone] Pager - Text Page per Amion  If 7PM-7AM, please contact night-coverage per Amion 10/20/2019, 9:48 AM

## 2019-10-20 NOTE — Progress Notes (Addendum)
Physical Therapy Treatment Patient Details Name: Matthew Bates MRN: [id number] DOB: Nov 08, 1945 Today's Date: 10/20/2019    History of Present Illness Pt is a 73 y.o. male admitted 10/09/19 with dyspnea. (+) COVID-19. Found to have BLE DVTs. PMH includes HTN, DM2, CAD, CKD.   PT Comments    Pt with increased fatigue and flat affect this session. Able to increase ambulation distance, trialled gait training with rollator to allow for added energy conservation with seat available as needed. SpO2 down to 72% on 8L HFNC, noted SpO2 quicker to recover with seated rest than previous session. Pt reports ability to breath through nose limited by bloody scabs inside nose which are painful and causing congestion (RN notified). Pt remains motivated to participate.    Follow Up Recommendations  Home health PT;Supervision for mobility/OOB     Equipment Recommendations  (TBD - may benefit from 3in1, rollator)    Recommendations for Other Services       Precautions / Restrictions Precautions Precautions: Fall Precaution Comments: Quick to desaturate but recovers quickly Restrictions Weight Bearing Restrictions: No    Mobility  Bed Mobility               General bed mobility comments: Seated in recliner  Transfers Overall transfer level: Modified independent Equipment used: 4-wheeled walker Transfers: Sit to/from Stand              Ambulation/Gait Ambulation/Gait assistance: Supervision Gait Distance (Feet): 216 Feet Assistive device: 4-wheeled walker Gait Pattern/deviations: Step-through pattern;Decreased stride length   Gait velocity interpretation: 1.31 - 2.62 ft/sec, indicative of limited community ambulator General Gait Details: Amb 136' + 80' with rollator (laps in room due to pt on 8L HFNC). SpO2 down to 72% on 8L HFNC, prolonged seated rest break due to fatigue; noted SpO2 quicker to recover to >/86% with rest compared to last session   Stairs              Wheelchair Mobility    Modified Rankin (Stroke Patients Only)       Balance Overall balance assessment: Needs assistance   Sitting balance-Leahy Scale: Good       Standing balance-Leahy Scale: Fair Standing balance comment: Can stand and walk without UE support, dynamic stability improved with UE suppor                            Cognition Arousal/Alertness: Awake/alert Behavior During Therapy: WFL for tasks assessed/performed;Flat affect Overall Cognitive Status: Within Functional Limits for tasks assessed                                        Exercises      General Comments        Pertinent Vitals/Pain Pain Assessment: Faces Faces Pain Scale: Hurts a little bit Pain Location: Generalized Pain Descriptors / Indicators: Tiring Pain Intervention(s): Monitored during session    Home Living                      Prior Function            PT Goals (current goals can now be found in the care plan section) Progress towards PT goals: Progressing toward goals    Frequency    Min 3X/week      PT Plan Current plan remains appropriate    Co-evaluation  AM-PAC PT "6 Clicks" Mobility   Outcome Measure  Help needed turning from your back to your side while in a flat bed without using bedrails?: None Help needed moving from lying on your back to sitting on the side of a flat bed without using bedrails?: None Help needed moving to and from a bed to a chair (including a wheelchair)?: None Help needed standing up from a chair using your arms (e.g., wheelchair or bedside chair)?: None Help needed to walk in hospital room?: A Little Help needed climbing 3-5 steps with a railing? : A Little 6 Click Score: 22    End of Session Equipment Utilized During Treatment: Oxygen Activity Tolerance: Patient tolerated treatment well;Patient limited by fatigue Patient left: in chair;with call bell/phone within  reach Nurse Communication: Mobility status PT Visit Diagnosis: Other abnormalities of gait and mobility (R26.89)     Time: 1014-1040 PT Time Calculation (min) (ACUTE ONLY): 26 min  Charges:  $Therapeutic Exercise: 23-37 mins                    Mabeline Caras, PT, DPT Acute Rehabilitation Services  Pager [phone] Office [place] 10/20/2019, 12:31 PM

## 2019-10-20 NOTE — Progress Notes (Signed)
Inpatient Diabetes Program Recommendations  AACE/ADA: New Consensus Statement on Inpatient Glycemic Control (2015)  Target Ranges:  Prepandial:   less than 140 mg/dL      Peak postprandial:   less than 180 mg/dL (1-2 hours)      Critically ill patients:  140 - 180 mg/dL   Lab Results  Component Value Date   GLUCAP 240 (H) 10/20/2019   HGBA1C 7.2 (H) 10/09/2019    Review of Glycemic Control Results for CHEIKH, RONNINGEN (MRN [id number]) as of 10/20/2019 12:40  Ref. Range 10/19/2019 07:16 10/19/2019 09:38 10/19/2019 11:41 10/19/2019 15:41 10/19/2019 16:32 10/19/2019 21:02 10/20/2019 07:27 10/20/2019 11:29  Glucose-Capillary Latest Ref Range: 70 - 99 mg/dL 266 (H) 286 (H) 229 (H) 329 (H) 312 (H) 282 (H) 168 (H) 240 (H)   Diabetes history: DM 2 Outpatient Diabetes medications:  Metformin 1000 mg bid,  Current orders for Inpatient glycemic control:  Decadron 4 mg  Novolog moderate tid with meals and HS Levemir 12 units daily Novolog 3 units tid with meals  Inpatient Diabetes Program Recommendations:    While on Decadron, consider increasing Novolog meal coverage to 5 units tid with meals. Also consider increasing Levemir to 8 units bid.   Thanks  Adah Perl, RN, BC-ADM Inpatient Diabetes Coordinator Pager [phone] (8a-5p)

## 2019-10-21 LAB — GLUCOSE, CAPILLARY
Glucose-Capillary: 159 mg/dL — ABNORMAL HIGH (ref 70–99)
Glucose-Capillary: 265 mg/dL — ABNORMAL HIGH (ref 70–99)
Glucose-Capillary: 320 mg/dL — ABNORMAL HIGH (ref 70–99)
Glucose-Capillary: 428 mg/dL — ABNORMAL HIGH (ref 70–99)
Glucose-Capillary: 369 mg/dL — ABNORMAL HIGH (ref 70–99)

## 2019-10-21 LAB — COMPREHENSIVE METABOLIC PANEL
ALT: 44 U/L (ref 0–44)
AST: 37 U/L (ref 15–41)
Albumin: 2.6 g/dL — ABNORMAL LOW (ref 3.5–5.0)
Alkaline Phosphatase: 89 U/L (ref 38–126)
Anion gap: 12 (ref 5–15)
BUN: 48 mg/dL — ABNORMAL HIGH (ref 8–23)
CO2: 24 mmol/L (ref 22–32)
Calcium: 9.2 mg/dL (ref 8.9–10.3)
Chloride: 96 mmol/L — ABNORMAL LOW (ref 98–111)
Creatinine, Ser: 1.5 mg/dL — ABNORMAL HIGH (ref 0.61–1.24)
GFR calc Af Amer: 52 mL/min — ABNORMAL LOW (ref >60)
GFR calc non Af Amer: 45 mL/min — ABNORMAL LOW (ref >60)
Glucose, Bld: 217 mg/dL — ABNORMAL HIGH (ref 70–99)
Potassium: 4.6 mmol/L (ref 3.5–5.1)
Sodium: 132 mmol/L — ABNORMAL LOW (ref 135–145)
Total Bilirubin: 0.7 mg/dL (ref 0.3–1.2)
Total Protein: 5.8 g/dL — ABNORMAL LOW (ref 6.5–8.1)

## 2019-10-21 LAB — CBC
HCT: 38.9 % — ABNORMAL LOW (ref 39.0–52.0)
Hemoglobin: 12.1 g/dL — ABNORMAL LOW (ref 13.0–17.0)
MCH: 24.6 pg — ABNORMAL LOW (ref 26.0–34.0)
MCHC: 31.1 g/dL (ref 30.0–36.0)
MCV: 79.1 fL — ABNORMAL LOW (ref 80.0–100.0)
Platelets: 368 10*3/uL (ref 150–400)
RBC: 4.92 MIL/uL (ref 4.22–5.81)
RDW: 17.6 % — ABNORMAL HIGH (ref 11.5–15.5)
WBC: 14.7 10*3/uL — ABNORMAL HIGH (ref 4.0–10.5)
nRBC: 0 % (ref 0.0–0.2)

## 2019-10-21 LAB — C-REACTIVE PROTEIN: CRP: 1.3 mg/dL — ABNORMAL HIGH (ref <1.0)

## 2019-10-21 LAB — FERRITIN: Ferritin: 147 ng/mL (ref 24–336)

## 2019-10-21 MED ORDER — DEXAMETHASONE 4 MG PO TABS
2.0000 mg | ORAL_TABLET | Freq: Every day | ORAL | Status: DC
  Administered 2019-10-22: 10:00:00 2 mg via ORAL
  Filled 2019-10-21: qty 1

## 2019-10-21 MED ORDER — APIXABAN 5 MG PO TABS
10.0000 mg | ORAL_TABLET | Freq: Two times a day (BID) | ORAL | Status: DC
  Administered 2019-10-21 – 2019-10-23 (×4): 10 mg via ORAL
  Filled 2019-10-21 (×4): qty 2

## 2019-10-21 MED ORDER — AMLODIPINE BESYLATE 10 MG PO TABS: 10.0000 mg | ORAL_TABLET | Freq: Every day | ORAL | Status: DC

## 2019-10-21 MED ORDER — LABETALOL HCL 100 MG PO TABS: 100.0000 mg | ORAL_TABLET | Freq: Three times a day (TID) | ORAL | Status: DC

## 2019-10-21 MED ORDER — FUROSEMIDE 10 MG/ML IJ SOLN
60.0000 mg | Freq: Once | INTRAMUSCULAR | Status: AC
  Administered 2019-10-21: 60 mg via INTRAVENOUS
  Filled 2019-10-21: qty 6

## 2019-10-21 MED ORDER — APIXABAN 5 MG PO TABS: 5.0000 mg | ORAL_TABLET | Freq: Two times a day (BID) | ORAL | Status: DC

## 2019-10-21 MED ORDER — LABETALOL HCL 100 MG PO TABS
100.0000 mg | ORAL_TABLET | Freq: Three times a day (TID) | ORAL | Status: DC
  Administered 2019-10-21 – 2019-10-23 (×7): 100 mg via ORAL
  Filled 2019-10-21 (×10): qty 1

## 2019-10-21 NOTE — Progress Notes (Signed)
[place] for Lovenox >> Apixaban Indication: DVT   Patient Measurements: Height: 5\' 9"  (175.3 cm) Weight: 189 lb (85.7 kg) IBW/kg (Calculated) : 70.7 Heparin Dosing Weight: 86 kg  Vital Signs: Temp: 98.2 F (36.8 C) (12/17 1400) Temp Source: Oral (12/17 1400) BP: 147/58 (12/17 1455) Pulse Rate: 85 (12/17 1400)  Labs: Recent Labs    10/19/19 0240 10/20/19 0010 10/21/19 0215  HGB 12.3*  --  12.1*  HCT 39.3  --  38.9*  PLT 413*  --  368  CREATININE 1.65* 1.64* 1.50*    Estimated Creatinine Clearance: 46.9 mL/min (A) (by C-G formula based on SCr of 1.5 mg/dL (H)).   Infusions:    Assessment: Pt was admitted for COVID and has finished his course of remdesivir. Dopplers on 12/11 revealed B/L DVTs with high clinical suspicion for PE as well. The patient was started on Heparin 12/11, then transitioned to Lovenox on 12/13, with plans to now transition to Apixaban.   The patient has received a 7d period of IV/SQ heparin/LMWH and has been therapeutic. Despite this - will continue with the Apixaban 10 mg bid lead in period followed by the 5 mg bid dosing - discussed with Dr. Thereasa Solo. CBC stable - no bleeding noted. Last Enox dose given on 12/17 AM.   Plan:  - D/c Lovenox - Start Apixaban 10 mg bid x 7d followed by 5 mg bid (starting on 12/24 AM) - AVS placed in chart, education done with wife Mariann Laster) via phone - Will sign off of consult but continue to monitor peripherally  Thank you for allowing pharmacy to be a part of this patient's care.  Alycia Rossetti, PharmD, BCPS Clinical Pharmacist Clinical phone for 10/21/2019: [phone] 10/21/2019 3:42 PM   **Pharmacist phone directory can now be found on [email or web address] ([email or web address]).  Listed under [place].

## 2019-10-21 NOTE — Progress Notes (Signed)
Physical Therapy Treatment Patient Details Name: Matthew Bates MRN: [id number] DOB: 01-15-1946 Today's Date: 10/21/2019    History of Present Illness Pt is a 73 y.o. male admitted 10/09/19 with dyspnea. (+) COVID-19. Found to have BLE DVTs; clinically suspected pulmonary embolism, unable to perform CT due to renal insufficiency. PMH includes HTN, DM2, CAD, CKD.    PT Comments    The patient was motivated to ambulate in the hall. Patient ambulated x 200' on 4 L with SPO2 75%(ear), RR 38. when returned. Patient recovered  On 6 l within 1 minute. Returned to 4 L and resting in recliner, SPO2 95%. Continue to progress mobility and monitor SPO2.   Follow Up Recommendations  Home health PT;Supervision for mobility/OOB     Equipment Recommendations  (4 wheeled RW)    Recommendations for Other Services       Precautions / Restrictions Precautions Precaution Comments: Quick to desaturate but recovers quickly    Mobility  Bed Mobility               General bed mobility comments: Seated in recliner  Transfers   Equipment used: 4-wheeled walker Transfers: Sit to/from Stand Sit to Stand: Supervision            Ambulation/Gait Ambulation/Gait assistance: Supervision Gait Distance (Feet): 210 Feet Assistive device: 4-wheeled walker Gait Pattern/deviations: Step-through pattern;Decreased stride length   Gait velocity interpretation: <1.31 ft/sec, indicative of household ambulator General Gait Details: amb. 210' without stopping on 4 L SPO2 75%, RR 38, recovered to 88% on 6 l. within 1 min. , to 4 L  and recovered to 95% ,   Stairs             Wheelchair Mobility    Modified Rankin (Stroke Patients Only)       Balance     Sitting balance-Leahy Scale: Good     Standing balance support: No upper extremity supported;During functional activity Standing balance-Leahy Scale: Fair                              Cognition   Behavior During Therapy:  WFL for tasks assessed/performed;Flat affect                                          Exercises      General Comments        Pertinent Vitals/Pain Faces Pain Scale: Hurts a little bit Pain Location: nose Pain Descriptors / Indicators: Discomfort Pain Intervention(s): Monitored during session    Home Living                      Prior Function            PT Goals (current goals can now be found in the care plan section) Progress towards PT goals: Progressing toward goals    Frequency    Min 3X/week      PT Plan Current plan remains appropriate    Co-evaluation              AM-PAC PT "6 Clicks" Mobility   Outcome Measure  Help needed turning from your back to your side while in a flat bed without using bedrails?: None Help needed moving from lying on your back to sitting on the side of a flat bed without using bedrails?: None  Help needed moving to and from a bed to a chair (including a wheelchair)?: None Help needed standing up from a chair using your arms (e.g., wheelchair or bedside chair)?: None Help needed to walk in hospital room?: A Little Help needed climbing 3-5 steps with a railing? : A Little 6 Click Score: 22    End of Session Equipment Utilized During Treatment: Oxygen Activity Tolerance: Patient tolerated treatment well;Patient limited by fatigue Patient left: in chair;with call bell/phone within reach Nurse Communication: Mobility status PT Visit Diagnosis: Other abnormalities of gait and mobility (R26.89)     Time: [id number] PT Time Calculation (min) (ACUTE ONLY): 38 min  Charges:  $Gait Training: 38-52 mins                     [place] Pager [phone] Office [phone]    Claretha Cooper 10/21/2019, 4:48 PM

## 2019-10-21 NOTE — Progress Notes (Addendum)
   10/21/19 1400  Vitals  BP (!) 171/57  Dr Thereasa Solo Notifies with [facility]. Waiting for resopnce. Sat with patient and calmed him rechecked his BP and it was WNL 147/58. Continuing to wean oxygen. Currently SAO2 93% on 3 liters of oxygen.

## 2019-10-21 NOTE — Progress Notes (Signed)
Matthew Bates  [id number] DOB: Oct 06, 1946 DOA: 10/09/2019 PCP: Marton Redwood, MD    Brief Narrative:  73 year old with a history of HTN, HLD, DM2, CAD, and CKD who presented to the ED with shortness of breath and was found to be hypoxic and Covid positive. A CXR noted bilateral interstitial infiltrates.  Significant Events: 12/5 admit via [place] 12/6 TTE 12/7 transfer to the [facility]  COVID-19 specific Treatment: Remdesivir 12/5 >12/9 Steroid 12/5 > Convalescent plasma 12/7 Actemra 12/7  Subjective: Continues to require high flow nasal cannula support but saturations are in the upper 90s.  CBGs are now trending downward.  Overall he feels that he is improving slowly.  Denies chest pain nausea vomiting or abdominal pain.  Assessment & Plan:  Covid pneumonia -acute hypoxic respiratory failure Has completed a course of remdesivir -remains on steroid for a 10-day course -dosed with Actemra and convalescent plasma -continue to use Lasix intermittently as required -inflammatory markers trending favorably  Recent Labs  Lab 10/15/19 0125 10/16/19 0138 10/17/19 0510 10/18/19 0430 10/21/19 0215  DDIMER >20.00* >20.00* 11.20* 5.82*  --   FERRITIN  --   --   --   --  147  CRP  --  1.8* 0.9 1.1* 1.3*  ALT  --   --   --   --  40  PROCALCITON  --  <0.10  --   --   --     Hemoptysis Felt to be due to pneumonitis as well as probable PE -essentially resolved at this time  Acute bilateral lower extremity DVT - clinically suspected pulmonary embolism Venous duplex confirmed bilateral lower extremity Dopplers -CT angio not able to be accomplished due to renal insufficiency -transition to Eliquis today (discussed with patient who agrees)  Acute on chronic diastolic CHF TTE noted EF 45-50% with no wall motion abnormalities -being dosed with Lasix intermittently with close monitoring of renal function -dose again today and follow  AKI on CKD stage II Baseline creatinine approximately  1.5  Recent Labs  Lab 10/17/19 0510 10/18/19 0430 10/19/19 0240 10/20/19 0010 10/21/19 0215  CREATININE 1.56* 1.55* 1.65* 1.64* 1.50*    Hyperkalemia Corrected with treatment  Uncontrolled DM2 with hyperglycemia CBG not yet at goal -adjust treatment again today and follow  HTN Blood pressure poorly controlled -adjust treatment today and follow  Transaminitis felt to be due to Covid itself - LFTs have normalized  CAD  HLD  Nasal skin cancer  DVT prophylaxis: Eliquis  Code Status: FULL CODE Family Communication:  Disposition Plan: tele   Consultants:  Cardiology  Antimicrobials:  None presently  Objective: Blood pressure (!) 163/78, pulse 74, temperature 98.9 F (37.2 C), temperature source Oral, resp. rate 18, height 5\' 9"  (1.753 m), weight 85.7 kg, SpO2 99 %.  Intake/Output Summary (Last 24 hours) at 10/21/2019 0911 Last data filed at 10/21/2019 0740 Gross per 24 hour  Intake 820 ml  Output 1625 ml  Net -805 ml   Filed Weights   10/09/19 1900  Weight: 85.7 kg    Examination: General: No acute respiratory distress despite moderate level oxygen support Lungs: Crackles bilateral bases with no wheezing Cardiovascular: RRR without murmur or rub Abdomen: NT/ND, soft, BS positive, no rebound Extremities: Trace bilateral lower extremity edema without change  CBC: Recent Labs  Lab 10/17/19 0510 10/18/19 0430 10/19/19 0240 10/21/19 0215  WBC 20.1* 19.5* 17.3* 14.7*  NEUTROABS 16.6* 16.3* 14.5*  --   HGB 12.3* 12.5* 12.3* 12.1*  HCT 39.3 39.4  39.3 38.9*  MCV 78.4* 79.0* 79.9* 79.1*  PLT 425* 398 413* [id number]   Basic Metabolic Panel: Recent Labs  Lab 10/19/19 0240 10/19/19 2200 10/20/19 0010 10/21/19 0215  NA 133*  --  135 132*  K 5.4* 5.6* 4.9 4.6  CL 95*  --  98 96*  CO2 26  --  24 24  GLUCOSE 274*  --  191* 217*  BUN 57*  --  50* 48*  CREATININE 1.65*  --  1.64* 1.50*  CALCIUM 9.5  --  9.7 9.2   GFR: Estimated Creatinine Clearance:  46.9 mL/min (A) (by C-G formula based on SCr of 1.5 mg/dL (H)).  Liver Function Tests: Recent Labs  Lab 10/21/19 0215  AST 37  ALT 44  ALKPHOS 89  BILITOT 0.7  PROT 5.8*  ALBUMIN 2.6*    HbA1C: Hgb A1c MFr Bld  Date/Time Value Ref Range Status  10/09/2019 05:41 PM 7.2 (H) 4.8 - 5.6 % Final    Comment:    (NOTE) Pre diabetes:          5.7%-6.4% Diabetes:              >6.4% Glycemic control for   <7.0% adults with diabetes   03/20/2009 03:54 PM (H) 4.6 - 6.1 % Final   6.6 (NOTE) The ADA recommends the following therapeutic goal for glycemic control related to Hgb A1c measurement: Goal of therapy: <6.5 Hgb A1c  Reference: American Diabetes Association: Clinical Practice Recommendations 2010, Diabetes Care, 2010, 33: (Suppl  1).    CBG: Recent Labs  Lab 10/20/19 1129 10/20/19 1604 10/20/19 1710 10/20/19 2050 10/21/19 0723  GLUCAP 240* 308* 314* 273* 159*     Scheduled Meds: . amLODipine  10 mg Oral Daily  . aspirin EC  81 mg Oral Daily  . cholecalciferol  1,000 Units Oral Daily  . dexamethasone (DECADRON) injection  4 mg Intravenous Q24H  . docusate sodium  100 mg Oral BID  . enoxaparin (LOVENOX) injection  85 mg Subcutaneous Q12H  . famotidine  40 mg Oral BID  . feeding supplement (GLUCERNA SHAKE)  237 mL Oral TID BM  . fenofibrate  160 mg Oral Daily  . insulin aspart  0-15 Units Subcutaneous TID WC  . insulin aspart  0-5 Units Subcutaneous QHS  . insulin aspart  6 Units Subcutaneous TID WC  . insulin detemir  16 Units Subcutaneous Daily  . Ipratropium-Albuterol  4 puff Inhalation QID  . labetalol  100 mg Oral BID  . rosuvastatin  20 mg Oral Daily  . sodium chloride flush  3 mL Intravenous Q12H  . vitamin C  500 mg Oral Daily  . zinc sulfate  220 mg Oral Daily       LOS: 12 days   Cherene Altes, MD [facility] Office  [phone] Pager - Text Page per Amion  If 7PM-7AM, please contact night-coverage per Amion 10/21/2019, 9:11  AM

## 2019-10-22 LAB — CBC
HCT: 40.2 % (ref 39.0–52.0)
Hemoglobin: 12.6 g/dL — ABNORMAL LOW (ref 13.0–17.0)
MCH: 25 pg — ABNORMAL LOW (ref 26.0–34.0)
MCHC: 31.3 g/dL (ref 30.0–36.0)
MCV: 79.9 fL — ABNORMAL LOW (ref 80.0–100.0)
Platelets: 332 10*3/uL (ref 150–400)
RBC: 5.03 MIL/uL (ref 4.22–5.81)
RDW: 17.4 % — ABNORMAL HIGH (ref 11.5–15.5)
WBC: 14.1 10*3/uL — ABNORMAL HIGH (ref 4.0–10.5)
nRBC: 0 % (ref 0.0–0.2)

## 2019-10-22 LAB — GLUCOSE, CAPILLARY
Glucose-Capillary: 247 mg/dL — ABNORMAL HIGH (ref 70–99)
Glucose-Capillary: 242 mg/dL — ABNORMAL HIGH (ref 70–99)
Glucose-Capillary: 339 mg/dL — ABNORMAL HIGH (ref 70–99)
Glucose-Capillary: 390 mg/dL — ABNORMAL HIGH (ref 70–99)

## 2019-10-22 LAB — BASIC METABOLIC PANEL
Anion gap: 10 (ref 5–15)
BUN: 55 mg/dL — ABNORMAL HIGH (ref 8–23)
CO2: 28 mmol/L (ref 22–32)
Calcium: 9.2 mg/dL (ref 8.9–10.3)
Chloride: 96 mmol/L — ABNORMAL LOW (ref 98–111)
Creatinine, Ser: 1.68 mg/dL — ABNORMAL HIGH (ref 0.61–1.24)
GFR calc Af Amer: 46 mL/min — ABNORMAL LOW (ref >60)
GFR calc non Af Amer: 39 mL/min — ABNORMAL LOW (ref >60)
Glucose, Bld: 268 mg/dL — ABNORMAL HIGH (ref 70–99)
Potassium: 4.7 mmol/L (ref 3.5–5.1)
Sodium: 134 mmol/L — ABNORMAL LOW (ref 135–145)

## 2019-10-22 LAB — MAGNESIUM: Magnesium: 2 mg/dL (ref 1.7–2.4)

## 2019-10-22 MED ORDER — INSULIN DETEMIR 100 UNIT/ML ~~LOC~~ SOLN
18.0000 [IU] | Freq: Every day | SUBCUTANEOUS | Status: DC
  Administered 2019-10-22 – 2019-10-23 (×2): 18 [IU] via SUBCUTANEOUS
  Filled 2019-10-22 (×3): qty 0.18

## 2019-10-22 MED ORDER — INSULIN ASPART 100 UNIT/ML ~~LOC~~ SOLN
10.0000 [IU] | Freq: Three times a day (TID) | SUBCUTANEOUS | Status: DC
  Administered 2019-10-22 – 2019-10-23 (×5): 10 [IU] via SUBCUTANEOUS

## 2019-10-22 NOTE — Progress Notes (Signed)
Matthew Bates  [id number] DOB: [birth year] DOA: 10/09/2019 PCP: Marton Redwood, MD    Brief Narrative:  73 year old with a history of HTN, HLD, DM2, CAD, and CKD who presented to the ED with shortness of breath and was found to be hypoxic and Covid positive. A CXR noted bilateral interstitial infiltrates.  Significant Events: 12/5 admit via [place] 12/6 TTE 12/7 transfer to the [facility]  COVID-19 specific Treatment: Remdesivir 12/5 >12/9 Steroid 12/5 > Convalescent plasma 12/7 Actemra 12/7  Subjective: Making good progress with patient now went to 2-3 L conventional nasal cannula support with sats in the mid to low 90s.  CBGs remain elevated above goal but overall are trending downward.  Creatinine trending upward with attempts to diurese.  Blood pressure remains above goal.  States that he feels much better today.  No chest pain nausea vomiting or abdominal pain.  Assessment & Plan:  Covid pneumonia -acute hypoxic respiratory failure Has completed a course of remdesivir -remains on steroid for a 10-day course -dosed with Actemra and convalescent plasma -continue to use Lasix intermittently as required -inflammatory markers trending favorably  Recent Labs  Lab 10/16/19 0138 10/17/19 0510 10/18/19 0430 10/21/19 0215  DDIMER >20.00* 11.20* 5.82*  --   FERRITIN  --   --   --  147  CRP 1.8* 0.9 1.1* 1.3*  ALT  --   --   --  9  PROCALCITON <0.10  --   --   --     Hemoptysis Felt to be due to pneumonitis as well as probable PE - essentially resolved at this time  Acute bilateral lower extremity DVT - clinically suspected pulmonary embolism Venous duplex confirmed bilateral lower extremity Dopplers -CT angio not able to be accomplished due to renal insufficiency -transitioned to Eliquis (discussed with patient who agrees)  Acute on chronic diastolic CHF TTE noted EF 45-50% with no wall motion abnormalities -being dosed with Lasix intermittently with close monitoring of renal  function   AKI on CKD stage II Baseline creatinine approximately 1.5  Recent Labs  Lab 10/18/19 0430 10/19/19 0240 10/20/19 0010 10/21/19 0215 10/22/19 0140  CREATININE 1.55* 1.65* 1.64* 1.50* 1.68*    Hyperkalemia Corrected with treatment  Uncontrolled DM2 with hyperglycemia CBG not yet at goal -adjust treatment again and follow  HTN Blood pressure not yet at goal -adjust treatment today  Transaminitis felt to be due to Covid itself - LFTs have normalized  CAD Asymptomatic  HLD  Nasal skin cancer Sutures intact with no evidence of complications/erythema  DVT prophylaxis: Eliquis  Code Status: FULL CODE Family Communication:  Disposition Plan: Discontinue cardiac monitoring -anticipate discharge 24/48 hours -attempt to wean to room air prior to discharge  Consultants:  Cardiology  Antimicrobials:  None presently  Objective: Blood pressure (!) 154/64, pulse 82, temperature 98 F (36.7 C), temperature source Oral, resp. rate (!) 21, height 5\' 9"  (1.753 m), weight 85.7 kg, SpO2 95 %.  Intake/Output Summary (Last 24 hours) at 10/22/2019 1432 Last data filed at 10/22/2019 1202 Gross per 24 hour  Intake 1280 ml  Output 2325 ml  Net -1045 ml   Filed Weights   10/09/19 1900  Weight: 85.7 kg    Examination: General: No acute respiratory distress  Lungs: Crackles bilateral bases which are unchanged Cardiovascular: RRR  Abdomen: NT/ND, soft, BS positive, no rebound Extremities: Trace bilateral lower extremity edema which is stable  CBC: Recent Labs  Lab 10/17/19 0510 10/18/19 0430 10/19/19 0240 10/21/19 0215 10/22/19 0140  WBC 20.1* 19.5* 17.3* 14.7* 14.1*  NEUTROABS 16.6* 16.3* 14.5*  --   --   HGB 12.3* 12.5* 12.3* 12.1* 12.6*  HCT 39.3 39.4 39.3 38.9* 40.2  MCV 78.4* 79.0* 79.9* 79.1* 79.9*  PLT 425* 398 413* 368 [id number]   Basic Metabolic Panel: Recent Labs  Lab 10/20/19 0010 10/21/19 0215 10/22/19 0140  NA 135 132* 134*  K 4.9 4.6 4.7    CL 98 96* 96*  CO2 24 24 28   GLUCOSE 191* 217* 268*  BUN 50* 48* 55*  CREATININE 1.64* 1.50* 1.68*  CALCIUM 9.7 9.2 9.2  MG  --   --  2.0   GFR: Estimated Creatinine Clearance: 41.9 mL/min (A) (by C-G formula based on SCr of 1.68 mg/dL (H)).  Liver Function Tests: Recent Labs  Lab 10/21/19 0215  AST 37  ALT 44  ALKPHOS 89  BILITOT 0.7  PROT 5.8*  ALBUMIN 2.6*    HbA1C: Hgb A1c MFr Bld  Date/Time Value Ref Range Status  10/09/2019 05:41 PM 7.2 (H) 4.8 - 5.6 % Final    Comment:    (NOTE) Pre diabetes:          5.7%-6.4% Diabetes:              >6.4% Glycemic control for   <7.0% adults with diabetes   03/20/2009 03:54 PM (H) 4.6 - 6.1 % Final   6.6 (NOTE) The ADA recommends the following therapeutic goal for glycemic control related to Hgb A1c measurement: Goal of therapy: <6.5 Hgb A1c  Reference: American Diabetes Association: Clinical Practice Recommendations 2010, Diabetes Care, 2010, 33: (Suppl  1).    CBG: Recent Labs  Lab 10/21/19 1607 10/21/19 1956 10/21/19 2132 10/22/19 0757 10/22/19 1144  GLUCAP 320* 428* 369* 247* 242*     Scheduled Meds: . amLODipine  10 mg Oral Daily  . apixaban  10 mg Oral BID   Followed by  . [START ON 10/28/2019] apixaban  5 mg Oral BID  . aspirin EC  81 mg Oral Daily  . cholecalciferol  1,000 Units Oral Daily  . dexamethasone  2 mg Oral Daily  . docusate sodium  100 mg Oral BID  . famotidine  40 mg Oral BID  . feeding supplement (GLUCERNA SHAKE)  237 mL Oral TID BM  . fenofibrate  160 mg Oral Daily  . insulin aspart  0-15 Units Subcutaneous TID WC  . insulin aspart  0-5 Units Subcutaneous QHS  . insulin aspart  10 Units Subcutaneous TID WC  . insulin detemir  18 Units Subcutaneous Daily  . Ipratropium-Albuterol  4 puff Inhalation QID  . labetalol  100 mg Oral TID  . rosuvastatin  20 mg Oral Daily  . sodium chloride flush  3 mL Intravenous Q12H  . vitamin C  500 mg Oral Daily  . zinc sulfate  220 mg Oral Daily        LOS: 13 days   Cherene Altes, MD [facility] Office  [phone] Pager - Text Page per Amion  If 7PM-7AM, please contact night-coverage per Amion 10/22/2019, 2:32 PM

## 2019-10-23 LAB — GLUCOSE, CAPILLARY: Glucose-Capillary: 148 mg/dL — ABNORMAL HIGH (ref 70–99)

## 2019-10-23 MED ORDER — LINAGLIPTIN 5 MG PO TABS
5.0000 mg | ORAL_TABLET | Freq: Every day | ORAL | Status: DC
  Administered 2019-10-23: 5 mg via ORAL
  Filled 2019-10-23: qty 1

## 2019-10-23 MED ORDER — FUROSEMIDE 40 MG PO TABS: 40.0000 mg | ORAL_TABLET | Freq: Every day | ORAL | Status: AC

## 2019-10-23 MED ORDER — LINAGLIPTIN 5 MG PO TABS: 5.0000 mg | ORAL_TABLET | Freq: Every day | ORAL | 1 refills | Status: AC

## 2019-10-23 MED ORDER — APIXABAN 5 MG PO TABS: ORAL_TABLET | ORAL | 1 refills | Status: AC

## 2019-10-23 MED ORDER — AMLODIPINE BESYLATE 10 MG PO TABS: 10.0000 mg | ORAL_TABLET | Freq: Every day | ORAL | 1 refills | Status: AC

## 2019-10-23 MED ORDER — ACETAMINOPHEN 325 MG PO TABS: 650.0000 mg | ORAL_TABLET | Freq: Four times a day (QID) | ORAL | Status: AC | PRN

## 2019-10-23 NOTE — Progress Notes (Addendum)
Patient observed resting, evening medications administered, able to tolerate. He is sitting up in the chair 2L of O2. Will continue to monitor throughout the evening.

## 2019-10-23 NOTE — Progress Notes (Signed)
    Patient Saturations on Room Air at Rest = 80%   Patient Saturations on 4Liters of oxygen while Ambulating 88%  Please briefly explain why patient needs home oxygen:Patient sitting comfortably on 2 liters nasal cannula oxygen stating 88%. Switched to room air and within one minute stats dropped to 80% oxygen. Increased oxygen to 6 liters until 88% reached and resumed 2 liters flow rate nasal cannula. Later bumped up to 4 liters and ambulated 25 ft patient stat 88% AND RESPIRATION 36

## 2019-10-23 NOTE — Discharge Instructions (Signed)
Date of Positive COVID Test: 10/09/2019  Date Quarantine Ends: 11/03/2019    COVID-19 COVID-19 is a respiratory infection that is caused by a virus called severe acute respiratory syndrome coronavirus 2 (SARS-CoV-2). The disease is also known as coronavirus disease or novel coronavirus. In some people, the virus may not cause any symptoms. In others, it may cause a serious infection. The infection can get worse quickly and can lead to complications, such as:  Pneumonia, or infection of the lungs.  Acute respiratory distress syndrome or ARDS. This is fluid build-up in the lungs.  Acute respiratory failure. This is a condition in which there is not enough oxygen passing from the lungs to the body.  Sepsis or septic shock. This is a serious bodily reaction to an infection.  Blood clotting problems.  Secondary infections due to bacteria or fungus. The virus that causes COVID-19 is contagious. This means that it can spread from person to person through droplets from coughs and sneezes (respiratory secretions). What are the causes? This illness is caused by a virus. You may catch the virus by:  Breathing in droplets from an infected person's cough or sneeze.  Touching something, like a table or a doorknob, that was exposed to the virus (contaminated) and then touching your mouth, nose, or eyes. What increases the risk? Risk for infection You are more likely to be infected with this virus if you:  Live in or travel to an area with a COVID-19 outbreak.  Come in contact with a sick person who recently traveled to an area with a COVID-19 outbreak.  Provide care for or live with a person who is infected with COVID-19. Risk for serious illness You are more likely to become seriously ill from the virus if you:  Are 15 years of age or older.  Have a long-term disease that lowers your body's ability to fight infection (immunocompromised).  Live in a nursing home or long-term care  facility.  Have a long-term (chronic) disease such as: ? Chronic lung disease, including chronic obstructive pulmonary disease or asthma ? Heart disease. ? Diabetes. ? Chronic kidney disease. ? Liver disease.  Are obese. What are the signs or symptoms? Symptoms of this condition can range from mild to severe. Symptoms may appear any time from 2 to 14 days after being exposed to the virus. They include:  A fever.  A cough.  Difficulty breathing.  Chills.  Muscle pains.  A sore throat.  Loss of taste or smell. Some people may also have stomach problems, such as nausea, vomiting, or diarrhea. Other people may not have any symptoms of COVID-19. How is this diagnosed? This condition may be diagnosed based on:  Your signs and symptoms, especially if: ? You live in an area with a COVID-19 outbreak. ? You recently traveled to or from an area where the virus is common. ? You provide care for or live with a person who was diagnosed with COVID-19.  A physical exam.  Lab tests, which may include: ? A nasal swab to take a sample of fluid from your nose. ? A throat swab to take a sample of fluid from your throat. ? A sample of mucus from your lungs (sputum). ? Blood tests.  Imaging tests, which may include, X-rays, CT scan, or ultrasound. How is this treated? At present, there is no medicine to treat COVID-19. Medicines that treat other diseases are being used on a trial basis to see if they are effective against COVID-19. Your health  care provider will talk with you about ways to treat your symptoms. For most people, the infection is mild and can be managed at home with rest, fluids, and over-the-counter medicines. Treatment for a serious infection usually takes places in a hospital intensive care unit (ICU). It may include one or more of the following treatments. These treatments are given until your symptoms improve.  Receiving fluids and medicines through an  IV.  Supplemental oxygen. Extra oxygen is given through a tube in the nose, a face mask, or a hood.  Positioning you to lie on your stomach (prone position). This makes it easier for oxygen to get into the lungs.  Continuous positive airway pressure (CPAP) or bi-level positive airway pressure (BPAP) machine. This treatment uses mild air pressure to keep the airways open. A tube that is connected to a motor delivers oxygen to the body.  Ventilator. This treatment moves air into and out of the lungs by using a tube that is placed in your windpipe.  Tracheostomy. This is a procedure to create a hole in the neck so that a breathing tube can be inserted.  Extracorporeal membrane oxygenation (ECMO). This procedure gives the lungs a chance to recover by taking over the functions of the heart and lungs. It supplies oxygen to the body and removes carbon dioxide. Follow these instructions at home: Lifestyle  If you are sick, stay home except to get medical care. Your health care provider will tell you how long to stay home. Call your health care provider before you go for medical care.  Rest at home as told by your health care provider.  Do not use any products that contain nicotine or tobacco, such as cigarettes, e-cigarettes, and chewing tobacco. If you need help quitting, ask your health care provider.  Return to your normal activities as told by your health care provider. Ask your health care provider what activities are safe for you. General instructions  Take over-the-counter and prescription medicines only as told by your health care provider.  Drink enough fluid to keep your urine pale yellow.  Keep all follow-up visits as told by your health care provider. This is important. How is this prevented?  There is no vaccine to help prevent COVID-19 infection. However, there are steps you can take to protect yourself and others from this virus. To protect yourself:   Do not travel to areas  where COVID-19 is a risk. The areas where COVID-19 is reported change often. To identify high-risk areas and travel restrictions, check the CDC travel website: FatFares.com.br  If you live in, or must travel to, an area where COVID-19 is a risk, take precautions to avoid infection. ? Stay away from people who are sick. ? Wash your hands often with soap and water for 20 seconds. If soap and water are not available, use an alcohol-based hand sanitizer. ? Avoid touching your mouth, face, eyes, or nose. ? Avoid going out in public, follow guidance from your state and local health authorities. ? If you must go out in public, wear a cloth face covering or face mask. ? Disinfect objects and surfaces that are frequently touched every day. This may include:  Counters and tables.  Doorknobs and light switches.  Sinks and faucets.  Electronics, such as phones, remote controls, keyboards, computers, and tablets. To protect others: If you have symptoms of COVID-19, take steps to prevent the virus from spreading to others.  If you think you have a COVID-19 infection, contact your health  care provider right away. Tell your health care team that you think you may have a COVID-19 infection.  Stay home. Leave your house only to seek medical care. Do not use public transport.  Do not travel while you are sick.  Wash your hands often with soap and water for 20 seconds. If soap and water are not available, use alcohol-based hand sanitizer.  Stay away from other members of your household. Let healthy household members care for children and pets, if possible. If you have to care for children or pets, wash your hands often and wear a mask. If possible, stay in your own room, separate from others. Use a different bathroom.  Make sure that all people in your household wash their hands well and often.  Cough or sneeze into a tissue or your sleeve or elbow. Do not cough or sneeze into your hand or  into the air.  Wear a cloth face covering or face mask. Where to find more information  Centers for Disease Control and Prevention: PurpleGadgets.be  World Health Organization: https://www.castaneda.info/ Contact a health care provider if:  You live in or have traveled to an area where COVID-19 is a risk and you have symptoms of the infection.  You have had contact with someone who has COVID-19 and you have symptoms of the infection. Get help right away if:  You have trouble breathing.  You have pain or pressure in your chest.  You have confusion.  You have bluish lips and fingernails.  You have difficulty waking from sleep.  You have symptoms that get worse. These symptoms may represent a serious problem that is an emergency. Do not wait to see if the symptoms will go away. Get medical help right away. Call your local emergency services (911 in the [place] Do not drive yourself to the hospital. Let the emergency medical personnel know if you think you have COVID-19. Summary  COVID-19 is a respiratory infection that is caused by a virus. It is also known as coronavirus disease or novel coronavirus. It can cause serious infections, such as pneumonia, acute respiratory distress syndrome, acute respiratory failure, or sepsis.  The virus that causes COVID-19 is contagious. This means that it can spread from person to person through droplets from coughs and sneezes.  You are more likely to develop a serious illness if you are 69 years of age or older, have a weak immunity, live in a nursing home, or have chronic disease.  There is no medicine to treat COVID-19. Your health care provider will talk with you about ways to treat your symptoms.  Take steps to protect yourself and others from infection. Wash your hands often and disinfect objects and surfaces that are frequently touched every day. Stay away from people who are sick and wear a mask if you  are sick. This information is not intended to replace advice given to you by your health care provider. Make sure you discuss any questions you have with your health care provider. Document Released: 11/26/2018 Document Revised: 03/18/2019 Document Reviewed: 11/26/2018 Elsevier Patient Education  2020 [employer].   COVID-19 Frequently Asked Questions COVID-19 (coronavirus disease) is an infection that is caused by a large family of viruses. Some viruses cause illness in people and others cause illness in animals like camels, cats, and bats. In some cases, the viruses that cause illness in animals can spread to humans. Where did the coronavirus come from? In December 2019, [place] told the [facility]) of  several cases of lung disease (human respiratory illness). These cases were linked to an open seafood and livestock market in the city of [place]. The link to the seafood and livestock market suggests that the virus may have spread from animals to humans. However, since that first outbreak in December, the virus has also been shown to spread from person to person. What is the name of the disease and the virus? Disease name Early on, this disease was called novel coronavirus. This is because scientists determined that the disease was caused by a new (novel) respiratory virus. The World Health Organization [facility]) has now named the disease COVID-19, or coronavirus disease. Virus name The virus that causes the disease is called severe acute respiratory syndrome coronavirus 2 (SARS-CoV-2). More information on disease and virus naming World Health Organization [facility]): www.who.int/emergencies/diseases/novel-coronavirus-2019/technical-guidance/naming-the-coronavirus-disease-(covid-2019)-and-the-virus-that-causes-it Who is at risk for complications from coronavirus disease? Some people may be at higher risk for complications from coronavirus disease. This includes older adults and people who  have chronic diseases, such as heart disease, diabetes, and lung disease. If you are at higher risk for complications, take these extra precautions:  Avoid close contact with people who are sick or have a fever or cough. Stay at least 3-6 ft (1-2 m) away from them, if possible.  Wash your hands often with soap and water for at least 20 seconds.  Avoid touching your face, mouth, nose, or eyes.  Keep supplies on hand at home, such as food, medicine, and cleaning supplies.  Stay home as much as possible.  Avoid social gatherings and travel. How does coronavirus disease spread? The virus that causes coronavirus disease spreads easily from person to person (is contagious). There are also cases of community-spread disease. This means the disease has spread to:  People who have no known contact with other infected people.  People who have not traveled to areas where there are known cases. It appears to spread from one person to another through droplets from coughing or sneezing. Can I get the virus from touching surfaces or objects? There is still a lot that we do not know about the virus that causes coronavirus disease. Scientists are basing a lot of information on what they know about similar viruses, such as:  Viruses cannot generally survive on surfaces for long. They need a human body (host) to survive.  It is more likely that the virus is spread by close contact with people who are sick (direct contact), such as through: ? Shaking hands or hugging. ? Breathing in respiratory droplets that travel through the air. This can happen when an infected person coughs or sneezes on or near other people.  It is less likely that the virus is spread when a person touches a surface or object that has the virus on it (indirect contact). The virus may be able to enter the body if the person touches a surface or object and then touches his or her face, eyes, nose, or mouth. Can a person spread the virus  without having symptoms of the disease? It may be possible for the virus to spread before a person has symptoms of the disease, but this is most likely not the main way the virus is spreading. It is more likely for the virus to spread by being in close contact with people who are sick and breathing in the respiratory droplets of a sick person's cough or sneeze. What are the symptoms of coronavirus disease? Symptoms vary from person to person and can range  from mild to severe. Symptoms may include:  Fever.  Cough.  Tiredness, weakness, or fatigue.  Fast breathing or feeling short of breath. These symptoms can appear anywhere from 2 to 14 days after you have been exposed to the virus. If you develop symptoms, call your health care provider. People with severe symptoms may need hospital care. If I am exposed to the virus, how long does it take before symptoms start? Symptoms of coronavirus disease may appear anywhere from 2 to 14 days after a person has been exposed to the virus. If you develop symptoms, call your health care provider. Should I be tested for this virus? Your health care provider will decide whether to test you based on your symptoms, history of exposure, and your risk factors. How does a health care provider test for this virus? Health care providers will collect samples to send for testing. Samples may include:  Taking a swab of fluid from the nose.  Taking fluid from the lungs by having you cough up mucus (sputum) into a sterile cup.  Taking a blood sample.  Taking a stool or urine sample. Is there a treatment or vaccine for this virus? Currently, there is no vaccine to prevent coronavirus disease. Also, there are no medicines like antibiotics or antivirals to treat the virus. A person who becomes sick is given supportive care, which means rest and fluids. A person may also relieve his or her symptoms by using over-the-counter medicines that treat sneezing, coughing, and  runny nose. These are the same medicines that a person takes for the common cold. If you develop symptoms, call your health care provider. People with severe symptoms may need hospital care. What can I do to protect myself and my family from this virus?     You can protect yourself and your family by taking the same actions that you would take to prevent the spread of other viruses. Take the following actions:  Wash your hands often with soap and water for at least 20 seconds. If soap and water are not available, use alcohol-based hand sanitizer.  Avoid touching your face, mouth, nose, or eyes.  Cough or sneeze into a tissue, sleeve, or elbow. Do not cough or sneeze into your hand or the air. ? If you cough or sneeze into a tissue, throw it away immediately and wash your hands.  Disinfect objects and surfaces that you frequently touch every day.  Avoid close contact with people who are sick or have a fever or cough. Stay at least 3-6 ft (1-2 m) away from them, if possible.  Stay home if you are sick, except to get medical care. Call your health care provider before you get medical care.  Make sure your vaccines are up to date. Ask your health care provider what vaccines you need. What should I do if I need to travel? Follow travel recommendations from your local health authority, the CDC, and WHO. Travel information and advice  Centers for Disease Control and Prevention (CDC): BodyEditor.hu  World Health Organization [facility]): ThirdIncome.ca Know the risks and take action to protect your health  You are at higher risk of getting coronavirus disease if you are traveling to areas with an outbreak or if you are exposed to travelers from areas with an outbreak.  Wash your hands often and practice good hygiene to lower the risk of catching or spreading the virus. What should I do if I am  sick? General instructions to stop the spread of infection  Wash your hands often with soap and water for at least 20 seconds. If soap and water are not available, use alcohol-based hand sanitizer.  Cough or sneeze into a tissue, sleeve, or elbow. Do not cough or sneeze into your hand or the air.  If you cough or sneeze into a tissue, throw it away immediately and wash your hands.  Stay home unless you must get medical care. Call your health care provider or local health authority before you get medical care.  Avoid public areas. Do not take public transportation, if possible.  If you can, wear a mask if you must go out of the house or if you are in close contact with someone who is not sick. Keep your home clean  Disinfect objects and surfaces that are frequently touched every day. This may include: ? Counters and tables. ? Doorknobs and light switches. ? Sinks and faucets. ? Electronics such as phones, remote controls, keyboards, computers, and tablets.  Wash dishes in hot, soapy water or use a dishwasher. Air-dry your dishes.  Wash laundry in hot water. Prevent infecting other household members  Let healthy household members care for children and pets, if possible. If you have to care for children or pets, wash your hands often and wear a mask.  Sleep in a different bedroom or bed, if possible.  Do not share personal items, such as razors, toothbrushes, deodorant, combs, brushes, towels, and washcloths. Where to find more information Centers for Disease Control and Prevention (CDC)  Information and news updates: https://www.butler-gonzalez.com/ World Health Organization [facility])  Information and news updates: MissExecutive.com.ee  Coronavirus health topic: https://www.castaneda.info/  Questions and answers on COVID-19: OpportunityDebt.at  Global tracker: who.sprinklr.com American Academy of  Pediatrics (AAP)  Information for families: www.healthychildren.org/English/health-issues/conditions/chest-lungs/Pages/2019-Novel-Coronavirus.aspx The coronavirus situation is changing rapidly. Check your local health authority website or the CDC and [facility] websites for updates and news. When should I contact a health care provider?  Contact your health care provider if you have symptoms of an infection, such as fever or cough, and you: ? Have been near anyone who is known to have coronavirus disease. ? Have come into contact with a person who is suspected to have coronavirus disease. ? Have traveled outside of the country. When should I get emergency medical care?  Get help right away by calling your local emergency services (911 in the [place] if you have: ? Trouble breathing. ? Pain or pressure in your chest. ? Confusion. ? Blue-tinged lips and fingernails. ? Difficulty waking from sleep. ? Symptoms that get worse. Let the emergency medical personnel know if you think you have coronavirus disease. Summary  A new respiratory virus is spreading from person to person and causing COVID-19 (coronavirus disease).  The virus that causes COVID-19 appears to spread easily. It spreads from one person to another through droplets from coughing or sneezing.  Older adults and those with chronic diseases are at higher risk of disease. If you are at higher risk for complications, take extra precautions.  There is currently no vaccine to prevent coronavirus disease. There are no medicines, such as antibiotics or antivirals, to treat the virus.  You can protect yourself and your family by washing your hands often, avoiding touching your face, and covering your coughs and sneezes. This information is not intended to replace advice given to you by your health care provider. Make sure you discuss any questions you have with your health care provider. Document Released: 02/16/2019 Document Revised:  02/16/2019 Document Reviewed: 02/16/2019  Elsevier Patient Education  2020 [place]: How to Protect Yourself and Others Know how it spreads  There is currently no vaccine to prevent coronavirus disease 2019 (COVID-19).  The best way to prevent illness is to avoid being exposed to this virus.  The virus is thought to spread mainly from person-to-person. ? Between people who are in close contact with one another (within about 6 feet). ? Through respiratory droplets produced when an infected person coughs, sneezes or talks. ? These droplets can land in the mouths or noses of people who are nearby or possibly be inhaled into the lungs. ? Some recent studies have suggested that COVID-19 may be spread by people who are not showing symptoms. Everyone should Clean your hands often  Wash your hands often with soap and water for at least 20 seconds especially after you have been in a public place, or after blowing your nose, coughing, or sneezing.  If soap and water are not readily available, use a hand sanitizer that contains at least 60% alcohol. Cover all surfaces of your hands and rub them together until they feel dry.  Avoid touching your eyes, nose, and mouth with unwashed hands. Avoid close contact  Stay home if you are sick.  Avoid close contact with people who are sick.  Put distance between yourself and other people. ? Remember that some people without symptoms may be able to spread virus. ? This is especially important for people who are at higher risk of getting very GainPain.com.cy Cover your mouth and nose with a cloth face cover when around others  You could spread COVID-19 to others even if you do not feel sick.  Everyone should wear a cloth face cover when they have to go out in public, for example to the grocery store or to pick up other necessities. ? Cloth face coverings should not  be placed on young children under age 50, anyone who has trouble breathing, or is unconscious, incapacitated or otherwise unable to remove the mask without assistance.  The cloth face cover is meant to protect other people in case you are infected.  Do NOT use a facemask meant for a Dietitian.  Continue to keep about 6 feet between yourself and others. The cloth face cover is not a substitute for social distancing. Cover coughs and sneezes  If you are in a private setting and do not have on your cloth face covering, remember to always cover your mouth and nose with a tissue when you cough or sneeze or use the inside of your elbow.  Throw used tissues in the trash.  Immediately wash your hands with soap and water for at least 20 seconds. If soap and water are not readily available, clean your hands with a hand sanitizer that contains at least 60% alcohol. Clean and disinfect  Clean AND disinfect frequently touched surfaces daily. This includes tables, doorknobs, light switches, countertops, handles, desks, phones, keyboards, toilets, faucets, and sinks. RackRewards.fr  If surfaces are dirty, clean them: Use detergent or soap and water prior to disinfection.  Then, use a household disinfectant. You can see a list of EPA-registered household disinfectants here. [email or web address] 03/09/2019 This information is not intended to replace advice given to you by your health care provider. Make sure you discuss any questions you have with your health care provider. Document Released: 02/16/2019 Document Revised: 03/17/2019 Document Reviewed: 02/16/2019 Elsevier Patient Education  2020 [place] on my medicine - Engineer, structural (  apixaban)  This medication education was reviewed with me or my healthcare representative as part of my discharge preparation.    Why was Eliquis prescribed for you? Eliquis  was prescribed to treat blood clots that may have been found in the veins of your legs (deep vein thrombosis) or in your lungs (pulmonary embolism) and to reduce the risk of them occurring again.  What do You need to know about Eliquis ? The starting dose is 10 mg (two 5 mg tablets) taken TWICE daily for the FIRST SEVEN (7) DAYS, then on Thursday, December 24th  the dose is reduced to ONE 5 mg tablet taken TWICE daily.  Eliquis may be taken with or without food.   Try to take the dose about the same time in the morning and in the evening. If you have difficulty swallowing the tablet whole please discuss with your pharmacist how to take the medication safely.  Take Eliquis exactly as prescribed and DO NOT stop taking Eliquis without talking to the doctor who prescribed the medication.  Stopping may increase your risk of developing a new blood clot.  Refill your prescription before you run out.  After discharge, you should have regular check-up appointments with your healthcare provider that is prescribing your Eliquis.    What do you do if you miss a dose? If a dose of ELIQUIS is not taken at the scheduled time, take it as soon as possible on the same day and twice-daily administration should be resumed. The dose should not be doubled to make up for a missed dose.  Important Safety Information A possible side effect of Eliquis is bleeding. You should call your healthcare provider right away if you experience any of the following: ? Bleeding from an injury or your nose that does not stop. ? Unusual colored urine (red or dark brown) or unusual colored stools (red or black). ? Unusual bruising for unknown reasons. ? A serious fall or if you hit your head (even if there is no bleeding).  Some medicines may interact with Eliquis and might increase your risk of bleeding or clotting while on Eliquis. To help avoid this, consult your healthcare provider or pharmacist prior to using any new  prescription or non-prescription medications, including herbals, vitamins, non-steroidal anti-inflammatory drugs (NSAIDs) and supplements.  This website has more information on Eliquis (apixaban): http://www.eliquis.com/eliquis/home

## 2019-10-23 NOTE — Progress Notes (Signed)
Reviewed discharge with patient and patients wife. Both expressed understanding, awaiting delivery of O2 concentrator at patients home. Tank and commode outside door, patients belongings packed and given to patient.

## 2019-10-23 NOTE — Discharge Summary (Signed)
DISCHARGE SUMMARY  Matthew Bates  MR#: [id number]  DOB:1974/04/29  Date of Admission: 10/09/2019 Date of Discharge: 10/23/2019  Attending Physician:Jeffrey Hennie Duos, MD  Patient's UH:[id number], Gwyndolyn Saxon, MD   Consults: none   Disposition: D/C home   Date of Positive COVID Test: 10/09/2019  Date Quarantine Ends: 10/24/2019  COVID-19 specific Treatment: Remdesivir 12/5 >12/9 Steroid 12/5 > 12/18 Convalescent plasma 12/7 Actemra 12/7  Follow-up Appts: Follow-up Information    Marton Redwood, MD Follow up in 1 week(s).   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 A CXR noted bilateral interstitial infiltrates.  Hospital Course:  Covid pneumonia - acute hypoxic respiratory failure Has completed a course of remdesivir and an extended course of steroids - dosed with Actemra and convalescent plasma - utilized Lasix intermittently - stable on RA > 2L when at rest at time of d/c - exertion capacity limited due to desaturation, but no extreme symptoms encountered when supplemented w/ 4L [place]  Hemoptysis Felt to be due to pneumonitis as well as probable PE - resolved and stable for days prior to d/c   Acute bilateral lower  extremity DVT - clinically suspected pulmonary embolism Venous duplex confirmed bilateral lower extremity Dopplers -CT angio not able to be accomplished due to renal insufficiency -transitioned to Eliquis (discussed with patient who agrees) which was tolerated without difficulty   Acute on chronic diastolic CHF TTE noted EF 45-50% with no wall motion abnormalities - dosed with Lasix intermittently during hospital stay with close monitoring of renal function - no lasix prescribed at d/c as crt on an upward deflection  AKI on CKD stage II Baseline creatinine approximately 1.5 - suggest recheck of crt in f/u   Hyperkalemia Corrected with treatment  Uncontrolled DM2 with hyperglycemia CBG not yet at goal at time of d/c, but A1c prior to admit 7.1, and steroids discontinued w/ last dose 12/18 - opting to send home without insulin - added tradjenta - check of CBG in f/u will be indicated   HTN Blood pressure controlled at time of d/c home   Transaminitis felt to be due to Covid itself - LFTs normalized prior to d/c   CAD Asymptomatic  HLD  Nasal skin cancer Sutures intact with no evidence of complications/erythema    Allergies as of 10/23/2019      Reactions   Metoclopramide Hcl Hypertension   Reglan=Increases HBP symptoms      Medication List    STOP taking these medications   losartan 100 MG tablet Commonly known as: COZAAR     TAKE these medications   acetaminophen 325 MG tablet Commonly known as: TYLENOL Take 2 tablets (650 mg total) by mouth every 6 (six) hours as needed for mild pain or headache (fever >/= 101).   amLODipine 10 MG  tablet Commonly known as: NORVASC Take 1 tablet (10 mg total) by mouth daily. Start taking on: October 24, 2019 What changed:   medication strength  how much to take  when to take this   apixaban 5 MG Tabs tablet Commonly known as: Eliquis Take 2 tablets (10mg ) twice daily for 7 days, then 1 tablet (5mg ) twice daily    aspirin EC 81 MG tablet Take 81 mg by mouth daily.   B-12 2500 MCG Tabs Take 2,500 mcg by mouth daily.   famotidine 40 MG tablet Commonly known as: PEPCID Take 40 mg by mouth 2 (two) times daily.   fenofibrate 160 MG tablet Take 1 tablet (160 mg total) by mouth daily.   Ferrex 150 150 MG capsule Generic drug: iron polysaccharides Take 150 mg by mouth daily.   folic acid 1 MG tablet Commonly known as: FOLVITE Take 1 mg by mouth daily.   furosemide 40 MG tablet Commonly known as: LASIX Take 1 tablet (40 mg total) by mouth daily. Start taking on: October 25, 2019 What changed: These instructions start on October 25, 2019. If you are unsure what to do until then, ask your doctor or other care provider.   labetalol 200 MG tablet Commonly known as: NORMODYNE TAKE 1 TABLET BY MOUTH TWICE A DAY What changed: when to take this   linagliptin 5 MG Tabs tablet Commonly known as: TRADJENTA Take 1 tablet (5 mg total) by mouth daily. Start taking on: October 24, 2019   metFORMIN 1000 MG tablet Commonly known as: GLUCOPHAGE Take 1,000 mg by mouth 2 (two) times daily with a meal.   multivitamin capsule Take 1 capsule by mouth daily.   rosuvastatin 20 MG tablet Commonly known as: CRESTOR Take 20 mg by mouth daily.            Durable Medical Equipment  (From admission, onward)         Start     Ordered   10/23/19 1332  For home use only DME oxygen  Once    Question Answer Comment  Length of Need 6 Months   Mode or (Route) Nasal cannula   Liters per Minute 4   Frequency Continuous (stationary and portable oxygen unit needed)   Oxygen conserving device Yes   Oxygen delivery system Gas      10/23/19 1331   10/23/19 1330  For home use only DME 3 n 1  Once     10/23/19 1329          Day of Discharge BP 126/77 (BP Location: Right Arm)   Pulse 78   Temp 97.6 F (36.4 C) (Oral)   Resp 18   Ht 5\' 9"  (1.753 m)   Wt 85.7 kg   SpO2 90%   BMI 27.91 kg/m    Physical Exam: General: No acute respiratory distress Lungs: faint scattered crackles - good air movement - no wheezing  Cardiovascular: Regular rate and rhythm without murmur gallop or rub normal S1 and S2 Abdomen: Nontender, nondistended, soft, bowel sounds positive, no rebound, no ascites, no appreciable mass Extremities: No significant cyanosis, clubbing, or edema bilateral lower extremities  Basic Metabolic Panel: Recent Labs  Lab 10/18/19 0430 10/19/19 0240 10/19/19 1545 10/19/19 2200 10/20/19 0010 10/21/19 0215 10/22/19 0140  NA 135 133*  --   --  135 132* 134*  K 5.0 5.4* 6.0* 5.6* 4.9 4.6 4.7  CL 96* 95*  --   --  98 96* 96*  CO2 26 26  --   --  24 24 28   GLUCOSE 232* 274*  --   --  191* 217* 268*  BUN 58* 57*  --   --  50* 48* 55*  CREATININE 1.55* 1.65*  --   --  1.64* 1.50* 1.68*  CALCIUM 9.9 9.5  --   --  9.7 9.2 9.2  MG  --   --   --   --   --   --  2.0    Liver Function Tests: Recent Labs  Lab 10/21/19 0215  AST 37  ALT 44  ALKPHOS 89  BILITOT 0.7  PROT 5.8*  ALBUMIN 2.6*   CBC: Recent Labs  Lab 10/17/19 0510 10/18/19 0430 10/19/19 0240 10/21/19 0215 10/22/19 0140  WBC 20.1* 19.5* 17.3* 14.7* 14.1*  NEUTROABS 16.6* 16.3* 14.5*  --   --   HGB 12.3* 12.5* 12.3* 12.1* 12.6*  HCT 39.3 39.4 39.3 38.9* 40.2  MCV 78.4* 79.0* 79.9* 79.1* 79.9*  PLT 425* 398 413* 368 332   BNP (last 3 results) Recent Labs    10/09/19 1741  BNP 1,496.3*    CBG: Recent Labs  Lab 10/21/19 2132 10/22/19 0757 10/22/19 1144 10/22/19 1601 10/22/19 1926  GLUCAP 369* 247* 242* 339* 390*    Time spent in discharge (includes decision making & examination of pt): 35 minutes  10/23/2019, 1:53 PM   Cherene Altes, MD [facility] Office  [phone]

## 2019-10-23 NOTE — TOC Transition Note (Signed)
Transition of Care [facility]) - CM/SW Discharge Note   Patient Details  Name: Matthew Bates MRN: [id number] Date of Birth: 22-Dec-1945  [facility] CM/SW Contact:  Geralynn Ochs, LCSW Phone Number: 10/23/2019, 4:05 PM   Clinical Narrative:   Patient oxygen to be delivered to the home by Hue[facility] Romans, wife instructed to call the hospital when oxygen has been delivered and family will come and pick up the patient. CSW offered patient choice for home health, and patient indicated no preference for agency. [facility] accepted patient for home health. [employer] to deliver 3N1 to bedside with portable oxygen. No further needs at this time.    Final next level of care: [place] Barriers to Discharge: Barriers Resolved   Patient Goals and CMS Choice Patient states their goals for this hospitalization and ongoing recovery are:: to get back home CMS Medicare.gov Compare Post Acute Care list provided to:: Patient Choice offered to / list presented to : Patient  Discharge Placement                  Name of family member notified: Mariann Laster Patient and family notified of of transfer: 10/23/19  Discharge Plan and Services                DME Arranged: 3-N-1, Oxygen DME Agency: [place] Date DME Agency Contacted: 10/23/19   Representative spoke with at DME Agency: Jeneen Rinks HH Arranged: PT, OT [facility] Agency: [place] Date [place]: 10/23/19   Representative spoke with at [place]: [place] ([place]) Interventions     Readmission Risk Interventions No flowsheet data found.

## 2019-10-23 NOTE — Progress Notes (Signed)
Report given to [facility], patient transferred to room 307

## 2019-10-25 DIAGNOSIS — J9601 Acute respiratory failure with hypoxia: Secondary | ICD-10-CM | POA: Diagnosis not present

## 2019-10-25 DIAGNOSIS — I5033 Acute on chronic diastolic (congestive) heart failure: Secondary | ICD-10-CM | POA: Diagnosis not present

## 2019-10-25 DIAGNOSIS — J1289 Other viral pneumonia: Secondary | ICD-10-CM | POA: Diagnosis not present

## 2019-10-25 DIAGNOSIS — U071 COVID-19: Secondary | ICD-10-CM | POA: Diagnosis not present

## 2019-10-25 LAB — GLUCOSE, CAPILLARY
Glucose-Capillary: 134 mg/dL — ABNORMAL HIGH (ref 70–99)
Glucose-Capillary: 150 mg/dL — ABNORMAL HIGH (ref 70–99)

## 2019-10-26 DIAGNOSIS — I2510 Atherosclerotic heart disease of native coronary artery without angina pectoris: Secondary | ICD-10-CM | POA: Diagnosis not present

## 2019-10-26 DIAGNOSIS — E1169 Type 2 diabetes mellitus with other specified complication: Secondary | ICD-10-CM | POA: Diagnosis not present

## 2019-10-26 DIAGNOSIS — U071 COVID-19: Secondary | ICD-10-CM | POA: Diagnosis not present

## 2019-10-26 DIAGNOSIS — J1289 Other viral pneumonia: Secondary | ICD-10-CM | POA: Diagnosis not present

## 2019-10-26 DIAGNOSIS — I129 Hypertensive chronic kidney disease with stage 1 through stage 4 chronic kidney disease, or unspecified chronic kidney disease: Secondary | ICD-10-CM | POA: Diagnosis not present

## 2019-10-26 DIAGNOSIS — E669 Obesity, unspecified: Secondary | ICD-10-CM | POA: Diagnosis not present

## 2019-10-26 DIAGNOSIS — E1122 Type 2 diabetes mellitus with diabetic chronic kidney disease: Secondary | ICD-10-CM | POA: Diagnosis not present

## 2019-10-26 DIAGNOSIS — I82403 Acute embolism and thrombosis of unspecified deep veins of lower extremity, bilateral: Secondary | ICD-10-CM | POA: Diagnosis not present

## 2019-10-26 DIAGNOSIS — J9601 Acute respiratory failure with hypoxia: Secondary | ICD-10-CM | POA: Diagnosis not present

## 2019-10-27 DIAGNOSIS — J9601 Acute respiratory failure with hypoxia: Secondary | ICD-10-CM | POA: Diagnosis not present

## 2019-10-27 DIAGNOSIS — I82403 Acute embolism and thrombosis of unspecified deep veins of lower extremity, bilateral: Secondary | ICD-10-CM | POA: Diagnosis not present

## 2019-10-27 DIAGNOSIS — E1169 Type 2 diabetes mellitus with other specified complication: Secondary | ICD-10-CM | POA: Diagnosis not present

## 2019-10-27 DIAGNOSIS — E669 Obesity, unspecified: Secondary | ICD-10-CM | POA: Diagnosis not present

## 2019-10-27 DIAGNOSIS — E1122 Type 2 diabetes mellitus with diabetic chronic kidney disease: Secondary | ICD-10-CM | POA: Diagnosis not present

## 2019-10-27 DIAGNOSIS — J1289 Other viral pneumonia: Secondary | ICD-10-CM | POA: Diagnosis not present

## 2019-10-27 DIAGNOSIS — I2510 Atherosclerotic heart disease of native coronary artery without angina pectoris: Secondary | ICD-10-CM | POA: Diagnosis not present

## 2019-10-27 DIAGNOSIS — U071 COVID-19: Secondary | ICD-10-CM | POA: Diagnosis not present

## 2019-10-27 DIAGNOSIS — I129 Hypertensive chronic kidney disease with stage 1 through stage 4 chronic kidney disease, or unspecified chronic kidney disease: Secondary | ICD-10-CM | POA: Diagnosis not present

## 2019-10-28 DIAGNOSIS — R0902 Hypoxemia: Secondary | ICD-10-CM | POA: Diagnosis not present

## 2019-10-28 DIAGNOSIS — J189 Pneumonia, unspecified organism: Secondary | ICD-10-CM | POA: Diagnosis not present

## 2019-10-28 DIAGNOSIS — Z209 Contact with and (suspected) exposure to unspecified communicable disease: Secondary | ICD-10-CM | POA: Diagnosis not present

## 2019-10-30 ENCOUNTER — Inpatient Hospital Stay (HOSPITAL_COMMUNITY)
Admission: EM | Admit: 2019-10-30 | Discharge: 2019-12-06 | DRG: 870 | Disposition: E | Payer: Medicare HMO | Attending: Pulmonary Disease | Admitting: Pulmonary Disease

## 2019-10-30 ENCOUNTER — Other Ambulatory Visit: Payer: Self-pay

## 2019-10-30 ENCOUNTER — Emergency Department (HOSPITAL_COMMUNITY): Payer: Medicare HMO

## 2019-10-30 ENCOUNTER — Inpatient Hospital Stay (HOSPITAL_COMMUNITY): Payer: Medicare HMO

## 2019-10-30 ENCOUNTER — Encounter (HOSPITAL_COMMUNITY): Payer: Self-pay

## 2019-10-30 DIAGNOSIS — J95851 Ventilator associated pneumonia: Secondary | ICD-10-CM | POA: Diagnosis not present

## 2019-10-30 DIAGNOSIS — R0602 Shortness of breath: Secondary | ICD-10-CM | POA: Diagnosis not present

## 2019-10-30 DIAGNOSIS — Z8249 Family history of ischemic heart disease and other diseases of the circulatory system: Secondary | ICD-10-CM

## 2019-10-30 DIAGNOSIS — R404 Transient alteration of awareness: Secondary | ICD-10-CM | POA: Diagnosis not present

## 2019-10-30 DIAGNOSIS — J1282 Pneumonia due to coronavirus disease 2019: Secondary | ICD-10-CM

## 2019-10-30 DIAGNOSIS — U071 COVID-19: Secondary | ICD-10-CM

## 2019-10-30 DIAGNOSIS — I5022 Chronic systolic (congestive) heart failure: Secondary | ICD-10-CM | POA: Diagnosis not present

## 2019-10-30 DIAGNOSIS — Z9689 Presence of other specified functional implants: Secondary | ICD-10-CM

## 2019-10-30 DIAGNOSIS — I82451 Acute embolism and thrombosis of right peroneal vein: Secondary | ICD-10-CM | POA: Diagnosis present

## 2019-10-30 DIAGNOSIS — G9341 Metabolic encephalopathy: Secondary | ICD-10-CM | POA: Diagnosis not present

## 2019-10-30 DIAGNOSIS — I462 Cardiac arrest due to underlying cardiac condition: Secondary | ICD-10-CM | POA: Diagnosis present

## 2019-10-30 DIAGNOSIS — R0902 Hypoxemia: Secondary | ICD-10-CM | POA: Diagnosis not present

## 2019-10-30 DIAGNOSIS — Z7901 Long term (current) use of anticoagulants: Secondary | ICD-10-CM

## 2019-10-30 DIAGNOSIS — R069 Unspecified abnormalities of breathing: Secondary | ICD-10-CM | POA: Diagnosis not present

## 2019-10-30 DIAGNOSIS — E875 Hyperkalemia: Secondary | ICD-10-CM | POA: Diagnosis present

## 2019-10-30 DIAGNOSIS — Z4682 Encounter for fitting and adjustment of non-vascular catheter: Secondary | ICD-10-CM | POA: Diagnosis not present

## 2019-10-30 DIAGNOSIS — J80 Acute respiratory distress syndrome: Secondary | ICD-10-CM

## 2019-10-30 DIAGNOSIS — J9383 Other pneumothorax: Secondary | ICD-10-CM | POA: Diagnosis not present

## 2019-10-30 DIAGNOSIS — K921 Melena: Secondary | ICD-10-CM | POA: Diagnosis present

## 2019-10-30 DIAGNOSIS — I5023 Acute on chronic systolic (congestive) heart failure: Secondary | ICD-10-CM | POA: Diagnosis present

## 2019-10-30 DIAGNOSIS — I82403 Acute embolism and thrombosis of unspecified deep veins of lower extremity, bilateral: Secondary | ICD-10-CM | POA: Diagnosis present

## 2019-10-30 DIAGNOSIS — Z789 Other specified health status: Secondary | ICD-10-CM

## 2019-10-30 DIAGNOSIS — K219 Gastro-esophageal reflux disease without esophagitis: Secondary | ICD-10-CM | POA: Diagnosis present

## 2019-10-30 DIAGNOSIS — J9621 Acute and chronic respiratory failure with hypoxia: Secondary | ICD-10-CM | POA: Diagnosis present

## 2019-10-30 DIAGNOSIS — E785 Hyperlipidemia, unspecified: Secondary | ICD-10-CM | POA: Diagnosis present

## 2019-10-30 DIAGNOSIS — I2699 Other pulmonary embolism without acute cor pulmonale: Secondary | ICD-10-CM | POA: Diagnosis present

## 2019-10-30 DIAGNOSIS — N170 Acute kidney failure with tubular necrosis: Secondary | ICD-10-CM | POA: Diagnosis present

## 2019-10-30 DIAGNOSIS — N179 Acute kidney failure, unspecified: Secondary | ICD-10-CM | POA: Diagnosis not present

## 2019-10-30 DIAGNOSIS — E1165 Type 2 diabetes mellitus with hyperglycemia: Secondary | ICD-10-CM | POA: Diagnosis present

## 2019-10-30 DIAGNOSIS — D696 Thrombocytopenia, unspecified: Secondary | ICD-10-CM | POA: Diagnosis not present

## 2019-10-30 DIAGNOSIS — Z978 Presence of other specified devices: Secondary | ICD-10-CM

## 2019-10-30 DIAGNOSIS — Z7984 Long term (current) use of oral hypoglycemic drugs: Secondary | ICD-10-CM

## 2019-10-30 DIAGNOSIS — Z95820 Peripheral vascular angioplasty status with implants and grafts: Secondary | ICD-10-CM

## 2019-10-30 DIAGNOSIS — Z9911 Dependence on respirator [ventilator] status: Secondary | ICD-10-CM | POA: Diagnosis not present

## 2019-10-30 DIAGNOSIS — A4189 Other specified sepsis: Principal | ICD-10-CM | POA: Diagnosis present

## 2019-10-30 DIAGNOSIS — I472 Ventricular tachycardia: Secondary | ICD-10-CM | POA: Diagnosis not present

## 2019-10-30 DIAGNOSIS — Z20822 Contact with and (suspected) exposure to covid-19: Secondary | ICD-10-CM | POA: Diagnosis not present

## 2019-10-30 DIAGNOSIS — J439 Emphysema, unspecified: Secondary | ICD-10-CM | POA: Diagnosis not present

## 2019-10-30 DIAGNOSIS — I469 Cardiac arrest, cause unspecified: Secondary | ICD-10-CM

## 2019-10-30 DIAGNOSIS — R0682 Tachypnea, not elsewhere classified: Secondary | ICD-10-CM | POA: Diagnosis not present

## 2019-10-30 DIAGNOSIS — K449 Diaphragmatic hernia without obstruction or gangrene: Secondary | ICD-10-CM | POA: Diagnosis present

## 2019-10-30 DIAGNOSIS — L8990 Pressure ulcer of unspecified site, unspecified stage: Secondary | ICD-10-CM | POA: Insufficient documentation

## 2019-10-30 DIAGNOSIS — E870 Hyperosmolality and hypernatremia: Secondary | ICD-10-CM | POA: Diagnosis present

## 2019-10-30 DIAGNOSIS — I959 Hypotension, unspecified: Secondary | ICD-10-CM | POA: Diagnosis not present

## 2019-10-30 DIAGNOSIS — R0603 Acute respiratory distress: Secondary | ICD-10-CM

## 2019-10-30 DIAGNOSIS — M109 Gout, unspecified: Secondary | ICD-10-CM | POA: Diagnosis present

## 2019-10-30 DIAGNOSIS — K5790 Diverticulosis of intestine, part unspecified, without perforation or abscess without bleeding: Secondary | ICD-10-CM | POA: Diagnosis not present

## 2019-10-30 DIAGNOSIS — D62 Acute posthemorrhagic anemia: Secondary | ICD-10-CM | POA: Diagnosis not present

## 2019-10-30 DIAGNOSIS — D509 Iron deficiency anemia, unspecified: Secondary | ICD-10-CM | POA: Diagnosis present

## 2019-10-30 DIAGNOSIS — I6782 Cerebral ischemia: Secondary | ICD-10-CM | POA: Diagnosis not present

## 2019-10-30 DIAGNOSIS — E1169 Type 2 diabetes mellitus with other specified complication: Secondary | ICD-10-CM | POA: Diagnosis present

## 2019-10-30 DIAGNOSIS — E1122 Type 2 diabetes mellitus with diabetic chronic kidney disease: Secondary | ICD-10-CM | POA: Diagnosis present

## 2019-10-30 DIAGNOSIS — J9600 Acute respiratory failure, unspecified whether with hypoxia or hypercapnia: Secondary | ICD-10-CM

## 2019-10-30 DIAGNOSIS — R6521 Severe sepsis with septic shock: Secondary | ICD-10-CM | POA: Diagnosis present

## 2019-10-30 DIAGNOSIS — I82409 Acute embolism and thrombosis of unspecified deep veins of unspecified lower extremity: Secondary | ICD-10-CM | POA: Diagnosis present

## 2019-10-30 DIAGNOSIS — Z66 Do not resuscitate: Secondary | ICD-10-CM | POA: Diagnosis not present

## 2019-10-30 DIAGNOSIS — R578 Other shock: Secondary | ICD-10-CM | POA: Diagnosis present

## 2019-10-30 DIAGNOSIS — Z87891 Personal history of nicotine dependence: Secondary | ICD-10-CM

## 2019-10-30 DIAGNOSIS — G4733 Obstructive sleep apnea (adult) (pediatric): Secondary | ICD-10-CM | POA: Diagnosis present

## 2019-10-30 DIAGNOSIS — J982 Interstitial emphysema: Secondary | ICD-10-CM | POA: Diagnosis not present

## 2019-10-30 DIAGNOSIS — I82441 Acute embolism and thrombosis of right tibial vein: Secondary | ICD-10-CM | POA: Diagnosis present

## 2019-10-30 DIAGNOSIS — R000 Tachycardia, unspecified: Secondary | ICD-10-CM | POA: Diagnosis not present

## 2019-10-30 DIAGNOSIS — L89152 Pressure ulcer of sacral region, stage 2: Secondary | ICD-10-CM | POA: Diagnosis present

## 2019-10-30 DIAGNOSIS — Z7982 Long term (current) use of aspirin: Secondary | ICD-10-CM

## 2019-10-30 DIAGNOSIS — J9690 Respiratory failure, unspecified, unspecified whether with hypoxia or hypercapnia: Secondary | ICD-10-CM

## 2019-10-30 DIAGNOSIS — I82462 Acute embolism and thrombosis of left calf muscular vein: Secondary | ICD-10-CM | POA: Diagnosis present

## 2019-10-30 DIAGNOSIS — J939 Pneumothorax, unspecified: Secondary | ICD-10-CM

## 2019-10-30 DIAGNOSIS — G931 Anoxic brain damage, not elsewhere classified: Secondary | ICD-10-CM | POA: Diagnosis not present

## 2019-10-30 DIAGNOSIS — Z888 Allergy status to other drugs, medicaments and biological substances status: Secondary | ICD-10-CM

## 2019-10-30 DIAGNOSIS — I2510 Atherosclerotic heart disease of native coronary artery without angina pectoris: Secondary | ICD-10-CM | POA: Diagnosis present

## 2019-10-30 DIAGNOSIS — Z806 Family history of leukemia: Secondary | ICD-10-CM

## 2019-10-30 DIAGNOSIS — Z951 Presence of aortocoronary bypass graft: Secondary | ICD-10-CM

## 2019-10-30 DIAGNOSIS — D689 Coagulation defect, unspecified: Secondary | ICD-10-CM | POA: Diagnosis not present

## 2019-10-30 DIAGNOSIS — G9340 Encephalopathy, unspecified: Secondary | ICD-10-CM | POA: Diagnosis not present

## 2019-10-30 DIAGNOSIS — R239 Unspecified skin changes: Secondary | ICD-10-CM

## 2019-10-30 DIAGNOSIS — E669 Obesity, unspecified: Secondary | ICD-10-CM | POA: Diagnosis not present

## 2019-10-30 DIAGNOSIS — E1151 Type 2 diabetes mellitus with diabetic peripheral angiopathy without gangrene: Secondary | ICD-10-CM | POA: Diagnosis present

## 2019-10-30 DIAGNOSIS — Z86010 Personal history of colonic polyps: Secondary | ICD-10-CM

## 2019-10-30 DIAGNOSIS — I4819 Other persistent atrial fibrillation: Secondary | ICD-10-CM | POA: Diagnosis present

## 2019-10-30 DIAGNOSIS — I130 Hypertensive heart and chronic kidney disease with heart failure and stage 1 through stage 4 chronic kidney disease, or unspecified chronic kidney disease: Secondary | ICD-10-CM | POA: Diagnosis present

## 2019-10-30 DIAGNOSIS — N1831 Chronic kidney disease, stage 3a: Secondary | ICD-10-CM | POA: Diagnosis present

## 2019-10-30 DIAGNOSIS — Z8719 Personal history of other diseases of the digestive system: Secondary | ICD-10-CM

## 2019-10-30 DIAGNOSIS — E781 Pure hyperglyceridemia: Secondary | ICD-10-CM | POA: Diagnosis present

## 2019-10-30 DIAGNOSIS — Z9861 Coronary angioplasty status: Secondary | ICD-10-CM

## 2019-10-30 DIAGNOSIS — J9601 Acute respiratory failure with hypoxia: Secondary | ICD-10-CM | POA: Diagnosis present

## 2019-10-30 DIAGNOSIS — R7401 Elevation of levels of liver transaminase levels: Secondary | ICD-10-CM | POA: Diagnosis present

## 2019-10-30 DIAGNOSIS — T380X5A Adverse effect of glucocorticoids and synthetic analogues, initial encounter: Secondary | ICD-10-CM | POA: Diagnosis present

## 2019-10-30 DIAGNOSIS — R7612 Nonspecific reaction to cell mediated immunity measurement of gamma interferon antigen response without active tuberculosis: Secondary | ICD-10-CM | POA: Diagnosis not present

## 2019-10-30 DIAGNOSIS — N189 Chronic kidney disease, unspecified: Secondary | ICD-10-CM | POA: Diagnosis present

## 2019-10-30 DIAGNOSIS — Z833 Family history of diabetes mellitus: Secondary | ICD-10-CM

## 2019-10-30 DIAGNOSIS — T797XXA Traumatic subcutaneous emphysema, initial encounter: Secondary | ICD-10-CM

## 2019-10-30 DIAGNOSIS — R9431 Abnormal electrocardiogram [ECG] [EKG]: Secondary | ICD-10-CM | POA: Diagnosis present

## 2019-10-30 DIAGNOSIS — Z8349 Family history of other endocrine, nutritional and metabolic diseases: Secondary | ICD-10-CM

## 2019-10-30 DIAGNOSIS — Z79899 Other long term (current) drug therapy: Secondary | ICD-10-CM

## 2019-10-30 DIAGNOSIS — J1289 Other viral pneumonia: Secondary | ICD-10-CM | POA: Diagnosis not present

## 2019-10-30 DIAGNOSIS — D6959 Other secondary thrombocytopenia: Secondary | ICD-10-CM | POA: Diagnosis not present

## 2019-10-30 HISTORY — DX: COVID-19: U07.1

## 2019-10-30 LAB — CBC WITH DIFFERENTIAL/PLATELET
Abs Immature Granulocytes: 0.06 10*3/uL (ref 0.00–0.07)
Basophils Absolute: 0.1 10*3/uL (ref 0.0–0.1)
Basophils Relative: 0 %
Eosinophils Absolute: 0.1 10*3/uL (ref 0.0–0.5)
Eosinophils Relative: 1 %
HCT: 44.1 % (ref 39.0–52.0)
Hemoglobin: 13.2 g/dL (ref 13.0–17.0)
Immature Granulocytes: 1 %
Lymphocytes Relative: 9 %
Lymphs Abs: 1 10*3/uL (ref 0.7–4.0)
MCH: 25.4 pg — ABNORMAL LOW (ref 26.0–34.0)
MCHC: 29.9 g/dL — ABNORMAL LOW (ref 30.0–36.0)
MCV: 84.8 fL (ref 80.0–100.0)
Monocytes Absolute: 1.1 10*3/uL — ABNORMAL HIGH (ref 0.1–1.0)
Monocytes Relative: 9 %
Neutro Abs: 9.4 10*3/uL — ABNORMAL HIGH (ref 1.7–7.7)
Neutrophils Relative %: 80 %
Platelets: 127 10*3/uL — ABNORMAL LOW (ref 150–400)
RBC: 5.2 MIL/uL (ref 4.22–5.81)
RDW: 19.3 % — ABNORMAL HIGH (ref 11.5–15.5)
WBC: 11.7 10*3/uL — ABNORMAL HIGH (ref 4.0–10.5)
nRBC: 0 % (ref 0.0–0.2)

## 2019-10-30 LAB — BASIC METABOLIC PANEL
Anion gap: 11 (ref 5–15)
BUN: 62 mg/dL — ABNORMAL HIGH (ref 8–23)
CO2: 21 mmol/L — ABNORMAL LOW (ref 22–32)
Calcium: 8.3 mg/dL — ABNORMAL LOW (ref 8.9–10.3)
Chloride: 106 mmol/L (ref 98–111)
Creatinine, Ser: 3.02 mg/dL — ABNORMAL HIGH (ref 0.61–1.24)
GFR calc Af Amer: 22 mL/min — ABNORMAL LOW (ref >60)
GFR calc non Af Amer: 19 mL/min — ABNORMAL LOW (ref >60)
Glucose, Bld: 284 mg/dL — ABNORMAL HIGH (ref 70–99)
Potassium: 7.1 mmol/L (ref 3.5–5.1)
Sodium: 138 mmol/L (ref 135–145)

## 2019-10-30 LAB — COMPREHENSIVE METABOLIC PANEL
ALT: 26 U/L (ref 0–44)
AST: 40 U/L (ref 15–41)
Albumin: 3 g/dL — ABNORMAL LOW (ref 3.5–5.0)
Alkaline Phosphatase: 110 U/L (ref 38–126)
Anion gap: 11 (ref 5–15)
BUN: 53 mg/dL — ABNORMAL HIGH (ref 8–23)
CO2: 21 mmol/L — ABNORMAL LOW (ref 22–32)
Calcium: 9.6 mg/dL (ref 8.9–10.3)
Chloride: 106 mmol/L (ref 98–111)
Creatinine, Ser: 2.12 mg/dL — ABNORMAL HIGH (ref 0.61–1.24)
GFR calc Af Amer: 34 mL/min — ABNORMAL LOW (ref >60)
GFR calc non Af Amer: 30 mL/min — ABNORMAL LOW (ref >60)
Glucose, Bld: 190 mg/dL — ABNORMAL HIGH (ref 70–99)
Potassium: 4.8 mmol/L (ref 3.5–5.1)
Sodium: 138 mmol/L (ref 135–145)
Total Bilirubin: 0.9 mg/dL (ref 0.3–1.2)
Total Protein: 7.4 g/dL (ref 6.5–8.1)

## 2019-10-30 LAB — BLOOD GAS, VENOUS
Acid-base deficit: 4.8 mmol/L — ABNORMAL HIGH (ref 0.0–2.0)
Acid-base deficit: 5.8 mmol/L — ABNORMAL HIGH (ref 0.0–2.0)
Bicarbonate: 18.6 mmol/L — ABNORMAL LOW (ref 20.0–28.0)
Bicarbonate: 18.2 mmol/L — ABNORMAL LOW (ref 20.0–28.0)
FIO2: 100
FIO2: 100
O2 Saturation: 78.5 %
O2 Saturation: 78.3 %
Patient temperature: 37.6
Patient temperature: 36.4
pCO2, Ven: 71.3 mmHg (ref 44.0–60.0)
pCO2, Ven: 57 mmHg (ref 44.0–60.0)
pH, Ven: 7.138 — CL (ref 7.250–7.430)
pH, Ven: 7.193 — CL (ref 7.250–7.430)
pO2, Ven: 53.7 mmHg — ABNORMAL HIGH (ref 32.0–45.0)
pO2, Ven: 50.6 mmHg — ABNORMAL HIGH (ref 32.0–45.0)

## 2019-10-30 LAB — POCT I-STAT 7, (LYTES, BLD GAS, ICA,H+H)
Acid-base deficit: 4 mmol/L — ABNORMAL HIGH (ref 0.0–2.0)
Bicarbonate: 24.4 mmol/L (ref 20.0–28.0)
Calcium, Ion: 1.12 mmol/L — ABNORMAL LOW (ref 1.15–1.40)
HCT: 35 % — ABNORMAL LOW (ref 39.0–52.0)
Hemoglobin: 11.9 g/dL — ABNORMAL LOW (ref 13.0–17.0)
O2 Saturation: 99 %
Patient temperature: 36.8
Potassium: 5.5 mmol/L — ABNORMAL HIGH (ref 3.5–5.1)
Sodium: 138 mmol/L (ref 135–145)
TCO2: 26 mmol/L (ref 22–32)
pCO2 arterial: 61.5 mmHg — ABNORMAL HIGH (ref 32.0–48.0)
pH, Arterial: 7.206 — ABNORMAL LOW (ref 7.350–7.450)
pO2, Arterial: 159 mmHg — ABNORMAL HIGH (ref 83.0–108.0)

## 2019-10-30 LAB — GLUCOSE, CAPILLARY: Glucose-Capillary: 370 mg/dL — ABNORMAL HIGH (ref 70–99)

## 2019-10-30 LAB — FIBRINOGEN: Fibrinogen: >800 mg/dL — ABNORMAL HIGH (ref 210–475)

## 2019-10-30 LAB — PROCALCITONIN: Procalcitonin: 0.28 ng/mL

## 2019-10-30 LAB — BLOOD GAS, ARTERIAL
Acid-base deficit: 4.3 mmol/L — ABNORMAL HIGH (ref 0.0–2.0)
Bicarbonate: 19.3 mmol/L — ABNORMAL LOW (ref 20.0–28.0)
FIO2: 100
O2 Saturation: 96.4 %
Patient temperature: 37.4
pCO2 arterial: 69.2 mmHg (ref 32.0–48.0)
pH, Arterial: 7.155 — CL (ref 7.350–7.450)
pO2, Arterial: 119 mmHg — ABNORMAL HIGH (ref 83.0–108.0)

## 2019-10-30 LAB — D-DIMER, QUANTITATIVE: D-Dimer, Quant: 1.65 ug/mL-FEU — ABNORMAL HIGH (ref 0.00–0.50)

## 2019-10-30 LAB — POTASSIUM: Potassium: 5.7 mmol/L — ABNORMAL HIGH (ref 3.5–5.1)

## 2019-10-30 LAB — LACTIC ACID, PLASMA
Lactic Acid, Venous: 2.5 mmol/L (ref 0.5–1.9)
Lactic Acid, Venous: 2 mmol/L (ref 0.5–1.9)

## 2019-10-30 LAB — ABO/RH: ABO/RH(D): A POS

## 2019-10-30 LAB — HEPARIN LEVEL (UNFRACTIONATED): Heparin Unfractionated: >2.2 IU/mL — ABNORMAL HIGH (ref 0.30–0.70)

## 2019-10-30 LAB — FERRITIN: Ferritin: 109 ng/mL (ref 24–336)

## 2019-10-30 LAB — BRAIN NATRIURETIC PEPTIDE: B Natriuretic Peptide: 595 pg/mL — ABNORMAL HIGH (ref 0.0–100.0)

## 2019-10-30 LAB — C-REACTIVE PROTEIN: CRP: 30.3 mg/dL — ABNORMAL HIGH (ref <1.0)

## 2019-10-30 LAB — APTT: aPTT: 168 seconds (ref 24–36)

## 2019-10-30 LAB — LACTATE DEHYDROGENASE: LDH: 486 U/L — ABNORMAL HIGH (ref 98–192)

## 2019-10-30 LAB — CBG MONITORING, ED: Glucose-Capillary: 299 mg/dL — ABNORMAL HIGH (ref 70–99)

## 2019-10-30 LAB — TRIGLYCERIDES: Triglycerides: 230 mg/dL — ABNORMAL HIGH (ref <150)

## 2019-10-30 MED ORDER — FENTANYL 2500MCG IN NS 250ML (10MCG/ML) PREMIX INFUSION
100.0000 ug/h | INTRAVENOUS | Status: DC
  Administered 2019-10-31: 01:00:00 100 ug/h via INTRAVENOUS
  Filled 2019-10-30: qty 250

## 2019-10-30 MED ORDER — INSULIN ASPART 100 UNIT/ML ~~LOC~~ SOLN
0.0000 [IU] | SUBCUTANEOUS | Status: DC
  Administered 2019-10-30: 7 [IU] via SUBCUTANEOUS
  Administered 2019-10-30: 5 [IU] via SUBCUTANEOUS
  Filled 2019-10-30: qty 1

## 2019-10-30 MED ORDER — EPINEPHRINE 1 MG/10ML IJ SOSY
PREFILLED_SYRINGE | INTRAMUSCULAR | Status: AC | PRN
  Administered 2019-10-30: 60 mg via INTRAVENOUS
  Administered 2019-10-30: 30 mg via INTRAVENOUS

## 2019-10-30 MED ORDER — ASPIRIN 300 MG RE SUPP
300.0000 mg | RECTAL | Status: AC
  Administered 2019-10-31: 300 mg via RECTAL
  Filled 2019-10-30: qty 1

## 2019-10-30 MED ORDER — PIPERACILLIN-TAZOBACTAM 3.375 G IVPB
3.3750 g | Freq: Three times a day (TID) | INTRAVENOUS | Status: DC
  Administered 2019-10-30 (×2): 3.375 g via INTRAVENOUS
  Filled 2019-10-30 (×3): qty 50

## 2019-10-30 MED ORDER — VASOPRESSIN 20 UNIT/ML IV SOLN
40.0000 [IU] | Freq: Once | INTRAVENOUS | Status: AC
  Administered 2019-10-30: 40 [IU] via INTRAVENOUS
  Filled 2019-10-30: qty 2

## 2019-10-30 MED ORDER — FENTANYL CITRATE (PF) 100 MCG/2ML IJ SOLN
INTRAMUSCULAR | Status: AC
  Filled 2019-10-30: qty 2

## 2019-10-30 MED ORDER — EPINEPHRINE 1 MG/10ML IJ SOSY
PREFILLED_SYRINGE | INTRAMUSCULAR | Status: AC
  Filled 2019-10-30: qty 10

## 2019-10-30 MED ORDER — PANTOPRAZOLE SODIUM 40 MG IV SOLR: 40.0000 mg | INTRAVENOUS | Status: DC

## 2019-10-30 MED ORDER — ONDANSETRON HCL 4 MG PO TABS: 4.0000 mg | ORAL_TABLET | Freq: Four times a day (QID) | ORAL | Status: DC | PRN

## 2019-10-30 MED ORDER — FENTANYL CITRATE (PF) 100 MCG/2ML IJ SOLN
50.0000 ug | Freq: Once | INTRAMUSCULAR | Status: AC
  Administered 2019-10-31: 50 ug via INTRAVENOUS

## 2019-10-30 MED ORDER — HEPARIN (PORCINE) 25000 UT/250ML-% IV SOLN: 600.0000 [IU]/h | INTRAVENOUS | Status: DC

## 2019-10-30 MED ORDER — PROPOFOL 1000 MG/100ML IV EMUL
25.0000 ug/kg/min | INTRAVENOUS | Status: DC
  Administered 2019-10-31: 01:00:00 10 ug/kg/min via INTRAVENOUS
  Filled 2019-10-30: qty 100

## 2019-10-30 MED ORDER — SODIUM BICARBONATE 8.4 % IV SOLN
INTRAVENOUS | Status: AC | PRN
  Administered 2019-10-30: 50 meq via INTRAVENOUS

## 2019-10-30 MED ORDER — LACTATED RINGERS IV SOLN: INTRAVENOUS | Status: DC

## 2019-10-30 MED ORDER — PROPOFOL 1000 MG/100ML IV EMUL
5.0000 ug/kg/min | INTRAVENOUS | Status: DC
  Administered 2019-10-30: 5 ug/kg/min via INTRAVENOUS
  Filled 2019-10-30 (×2): qty 100

## 2019-10-30 MED ORDER — FENTANYL CITRATE (PF) 100 MCG/2ML IJ SOLN
INTRAMUSCULAR | Status: AC
  Administered 2019-10-30: 100 ug
  Filled 2019-10-30: qty 2

## 2019-10-30 MED ORDER — NOREPINEPHRINE 4 MG/250ML-% IV SOLN
0.0000 ug/min | INTRAVENOUS | Status: DC
  Administered 2019-10-30: 40 ug/min via INTRAVENOUS
  Administered 2019-10-30: 2 ug/min via INTRAVENOUS
  Filled 2019-10-30: qty 500
  Filled 2019-10-30: qty 250

## 2019-10-30 MED ORDER — FENTANYL BOLUS VIA INFUSION
25.0000 ug | INTRAVENOUS | Status: DC | PRN
  Filled 2019-10-30: qty 25

## 2019-10-30 MED ORDER — FENTANYL CITRATE (PF) 100 MCG/2ML IJ SOLN
100.0000 ug | Freq: Once | INTRAMUSCULAR | Status: AC
  Administered 2019-10-30: 100 ug via INTRAVENOUS

## 2019-10-30 MED ORDER — ONDANSETRON HCL 4 MG/2ML IJ SOLN: 4.0000 mg | Freq: Four times a day (QID) | INTRAMUSCULAR | Status: DC | PRN

## 2019-10-30 MED ORDER — SODIUM BICARBONATE 8.4 % IV SOLN
INTRAVENOUS | Status: AC
  Filled 2019-10-30: qty 50

## 2019-10-30 MED ORDER — ROCURONIUM BROMIDE 50 MG/5ML IV SOLN
100.0000 mg | Freq: Once | INTRAVENOUS | Status: AC
  Administered 2019-10-30: 100 mg via INTRAVENOUS

## 2019-10-30 MED ORDER — EPINEPHRINE PF 1 MG/ML IJ SOLN
INTRAMUSCULAR | Status: AC
  Filled 2019-10-30: qty 4

## 2019-10-30 MED ORDER — EPINEPHRINE HCL 5 MG/250ML IV SOLN IN NS
0.5000 ug/min | INTRAVENOUS | Status: DC
  Administered 2019-10-30: 20 ug/min via INTRAVENOUS
  Filled 2019-10-30 (×2): qty 250

## 2019-10-30 MED ORDER — SODIUM BICARBONATE 8.4 % IV SOLN
INTRAVENOUS | Status: DC
  Filled 2019-10-30 (×7): qty 100

## 2019-10-30 MED ORDER — SODIUM CHLORIDE 0.9 % IV BOLUS
500.0000 mL | Freq: Once | INTRAVENOUS | Status: AC
  Administered 2019-10-30: 500 mL via INTRAVENOUS

## 2019-10-30 MED ORDER — SODIUM CHLORIDE 0.9 % IV SOLN: INTRAVENOUS | Status: DC

## 2019-10-30 MED ORDER — ETOMIDATE 2 MG/ML IV SOLN
0.3000 mg/kg | Freq: Once | INTRAVENOUS | Status: AC
  Administered 2019-10-30: 25.72 mg via INTRAVENOUS
  Filled 2019-10-30: qty 20

## 2019-10-30 MED ORDER — ALBUTEROL SULFATE HFA 108 (90 BASE) MCG/ACT IN AERS
INHALATION_SPRAY | RESPIRATORY_TRACT | Status: AC
  Administered 2019-10-30: 8
  Filled 2019-10-30: qty 6.7

## 2019-10-30 MED ORDER — HEPARIN (PORCINE) 25000 UT/250ML-% IV SOLN
1000.0000 [IU]/h | INTRAVENOUS | Status: DC
  Administered 2019-10-30: 1000 [IU]/h via INTRAVENOUS
  Filled 2019-10-30: qty 250

## 2019-10-30 MED ORDER — NOREPINEPHRINE 16 MG/250ML-% IV SOLN
0.0000 ug/min | INTRAVENOUS | Status: DC
  Administered 2019-10-30: 40 ug/min via INTRAVENOUS
  Administered 2019-10-31: 24 ug/min via INTRAVENOUS
  Administered 2019-10-31: 01:00:00 30 ug/min via INTRAVENOUS
  Administered 2019-10-31: 12:00:00 22 ug/min via INTRAVENOUS
  Administered 2019-11-02: 23:00:00 5 ug/min via INTRAVENOUS
  Filled 2019-10-30 (×5): qty 250

## 2019-10-30 MED ORDER — NOREPINEPHRINE 16 MG/250ML-% IV SOLN
INTRAVENOUS | Status: AC
  Filled 2019-10-30: qty 250

## 2019-10-30 MED ORDER — FUROSEMIDE 10 MG/ML IJ SOLN
30.0000 mg | Freq: Once | INTRAMUSCULAR | Status: AC
  Administered 2019-10-30: 30 mg via INTRAVENOUS
  Filled 2019-10-30: qty 4

## 2019-10-30 MED ORDER — CHLORHEXIDINE GLUCONATE CLOTH 2 % EX PADS
6.0000 | MEDICATED_PAD | Freq: Every day | CUTANEOUS | Status: DC
  Administered 2019-11-01 – 2019-11-08 (×9): 6 via TOPICAL

## 2019-10-30 MED ORDER — ATROPINE SULFATE 1 MG/ML IJ SOLN
INTRAMUSCULAR | Status: AC | PRN
  Administered 2019-10-30: 1 mg via INTRAVENOUS

## 2019-10-30 MED ORDER — METHYLPREDNISOLONE SODIUM SUCC 125 MG IJ SOLR
60.0000 mg | Freq: Two times a day (BID) | INTRAMUSCULAR | Status: DC
  Administered 2019-10-30 (×2): 60 mg via INTRAVENOUS
  Filled 2019-10-30 (×2): qty 2

## 2019-10-30 MED ORDER — PANTOPRAZOLE SODIUM 40 MG IV SOLR
80.0000 mg | Freq: Once | INTRAVENOUS | Status: AC
  Administered 2019-10-30: 80 mg via INTRAVENOUS
  Filled 2019-10-30: qty 80

## 2019-10-30 MED ORDER — EPINEPHRINE 1 MG/10ML IJ SOSY
PREFILLED_SYRINGE | INTRAMUSCULAR | Status: AC | PRN
  Administered 2019-10-30: 0.5 mg via INTRAVENOUS

## 2019-10-30 MED ORDER — HEPARIN BOLUS VIA INFUSION
4000.0000 [IU] | Freq: Once | INTRAVENOUS | Status: AC
  Administered 2019-10-30: 4000 [IU] via INTRAVENOUS

## 2019-10-30 MED ORDER — ATROPINE SULFATE 1 MG/10ML IJ SOSY
PREFILLED_SYRINGE | INTRAMUSCULAR | Status: AC
  Filled 2019-10-30: qty 10

## 2019-10-30 MED ORDER — INSULIN ASPART 100 UNIT/ML ~~LOC~~ SOLN
10.0000 [IU] | Freq: Once | SUBCUTANEOUS | Status: AC
  Administered 2019-10-30: 10 [IU] via INTRAVENOUS

## 2019-10-30 MED ORDER — AMIODARONE HCL 150 MG/3ML IV SOLN
INTRAVENOUS | Status: AC | PRN
  Administered 2019-10-30: 150 mg via INTRAVENOUS

## 2019-10-30 NOTE — ED Notes (Signed)
55mcg/minute given continuous

## 2019-10-30 NOTE — H&P (Signed)
History and Physical  [facility]  Matthew Bates [id number] DOB: 1946/09/13 DOA: 10/29/2019  PCP: Marton Redwood, MD   Patient coming from: Home by EMS   I have personally briefly reviewed patient's old medical records in [place]  Chief Complaint: increasing SOB   HPI: Matthew Bates is a 73 y.o. male with HFrEF, CAD, DM, HTN, GERD and recently discharged from [facility] hospital after being treated for Covid pneumonia where he received remdesivir, steroids, Actemra and convalescent plasma.  He was noted to have a DVT during that hospitalization and was thought to have a PE as well.  He was discharged on oral apixaban which he had been taking at home.  He was discharged on 4 L oxygen nasal cannula and had been doing fairly well at home up until the last 24 hours when he began to experience increasing shortness of breath and exercise intolerance.  EMS arrived and found him to be severely dyspneic with a pulse ox in the 76% range on room air and this was improved when he was placed on 15 L high flow nasal cannula.  His oxygen saturation improved to 86 to 95%.  He was noted to have an elevated lactic acid on arrival of 2.5 and was given a 500 mL bolus of IV fluids.  His WBC was noted to be 11.7.  His CRP was 30.3.  His discharge CRP from last week was 1.3.  He was noted to have a procalcitonin of 0.28.  His D-dimer was 1.65 which is down from when he was discharged last week.  His fibrinogen was greater than 800.  His LDH was 46.  His creatinine was 2.12.  His glucose was 190.  His BNP was 595.  He was given IVs Solu-Medrol and IV Zosyn in the ED.  While waiting for admission he was down in the ED for several hours continued to have increasing respiratory distress and tachypnea and began to tire.  He started to have acute decompensation and was intubated in the emergency department.  I spoke with critical care at [facility] Dr. Lake Bells who agreed to accept the patient and they [facility] hospital intensive care  unit.  Review of Systems: UTO due to condition   Past Medical History:  Diagnosis Date  . Anemia   . CAD (coronary artery disease)   . Carotid artery occlusion   . Cataract   . Chronic kidney disease   . COVID-19   . DM (diabetes mellitus) ([place])   . GERD (gastroesophageal reflux disease)   . Gout   . Hepatitis    as a child  . High triglycerides   . HTN (hypertension)   . Obesity     Past Surgical History:  Procedure Laterality Date  . ANGIOPLASTY     --cardiac  . APPENDECTOMY    . CAROTID ENDARTERECTOMY  05/22/11   Left CEA  . CATARACT EXTRACTION Bilateral   . COLONOSCOPY  10-09-13  . KNEE SURGERY Right   . PR VEIN BYPASS GRAFT,AORTO-FEM-POP  2010  . spleen surgery       reports that he quit smoking about 49 years ago. His smoking use included cigarettes. He has never used smokeless tobacco. He reports current alcohol use. He reports that he does not use drugs.  Allergies  Allergen Reactions  . Metoclopramide Hcl Hypertension    Reglan=Increases HBP symptoms    Family History  Problem Relation Age of Onset  . Heart attack Father   .  Hyperlipidemia Father   . Hypertension Father   . Heart disease Father        Heart Disease before age 46- PVD  . Diabetes Brother   . Heart disease Brother        Heart Disese before age 54  . Hyperlipidemia Brother   . Hypertension Brother   . Leukemia Brother   . Heart disease Mother        Heart Disease before age 39  . Hyperlipidemia Mother   . Hypertension Mother   . Heart attack Mother   . Coronary artery disease Other   . Colon cancer Neg Hx   . Esophageal cancer Neg Hx   . Pancreatic cancer Neg Hx   . Stomach cancer Neg Hx   . Liver disease Neg Hx   . Rectal cancer Neg Hx    Prior to Admission medications   Medication Sig Start Date End Date Taking? Authorizing Provider  acetaminophen (TYLENOL) 325 MG tablet Take 2 tablets (650 mg total) by mouth every 6 (six) hours as needed for mild pain or headache (fever  >/= 101). 10/23/19   Cherene Altes, MD  amLODipine (NORVASC) 10 MG tablet Take 1 tablet (10 mg total) by mouth daily. 10/24/19   Cherene Altes, MD  apixaban (ELIQUIS) 5 MG TABS tablet Take 2 tablets (10mg ) twice daily for 7 days, then 1 tablet (5mg ) twice daily 10/23/19   Cherene Altes, MD  aspirin EC 81 MG tablet Take 81 mg by mouth daily.    [provider]  Cyanocobalamin (B-12) 2500 MCG TABS Take 2,500 mcg by mouth daily.     [provider]  famotidine (PEPCID) 40 MG tablet Take 40 mg by mouth 2 (two) times daily.    [provider]  fenofibrate 160 MG tablet Take 1 tablet (160 mg total) by mouth daily. 12/04/11   Larey Dresser, MD  FERREX 150 150 MG capsule Take 150 mg by mouth daily.  07/20/19   [provider]  folic acid (FOLVITE) 1 MG tablet Take 1 mg by mouth daily.      [provider]  furosemide (LASIX) 40 MG tablet Take 1 tablet (40 mg total) by mouth daily. 10/25/19   Cherene Altes, MD  labetalol (NORMODYNE) 200 MG tablet TAKE 1 TABLET BY MOUTH TWICE A DAY Patient taking differently: Take 200 mg by mouth 3 (three) times daily.  08/02/15   Larey Dresser, MD  linagliptin (TRADJENTA) 5 MG TABS tablet Take 1 tablet (5 mg total) by mouth daily. 10/24/19   Cherene Altes, MD  metFORMIN (GLUCOPHAGE) 1000 MG tablet Take 1,000 mg by mouth 2 (two) times daily with a meal.  02/09/11   [provider]  Multiple Vitamin (MULTIVITAMIN) capsule Take 1 capsule by mouth daily.      [provider]  rosuvastatin (CRESTOR) 20 MG tablet Take 20 mg by mouth daily. 02/11/16   [provider]    Physical Exam: Vitals:   11/02/2019 0630 10/11/2019 0635 10/20/2019 0700 10/27/2019 0730  BP: (!) 108/58  127/66 127/65  Pulse: 94 95 94 93  Resp: (!) 26 (!) 27 (!) 23 (!) 27  Temp:      TempSrc:      SpO2: 94% 93% 93% 93%  Weight:       Constitutional: Patient appears distressed he is tachypneic he is pale and  complains of shortness of breath Eyes: PERRL, lids and conjunctivae normal ENMT: Mucous membranes  are moist. Posterior pharynx clear of any exudate or lesions.  Neck: normal, supple, no masses, no thyromegaly Respiratory: Shallow breathing bilateral no crackles.  Tachypnea.  Mild to moderate accessory muscle use.  Cardiovascular: Regular rate and rhythm, no murmurs / rubs / gallops. No extremity edema. 2+ pedal pulses. No carotid bruits.  Abdomen: no tenderness, no masses palpated. No hepatosplenomegaly. Bowel sounds positive.  Musculoskeletal: no clubbing / cyanosis. No joint deformity upper and lower extremities. Good ROM, no contractures. Normal muscle tone.  Skin: no rashes, lesions, ulcers. No induration Neurologic: CN 2-12 grossly intact. Sensation intact, DTR normal. Strength 5/5 in all 4.  Psychiatric:  Alert and oriented x 3. Normal mood.   Labs on Admission: I have personally reviewed following labs and imaging studies  CBC: Recent Labs  Lab 10/29/2019 0537  WBC 11.7*  NEUTROABS 9.4*  HGB 13.2  HCT 44.1  MCV 84.8  PLT [id number]*   Basic Metabolic Panel: Recent Labs  Lab 10/31/2019 0537  NA 138  K 4.8  CL 106  CO2 21*  GLUCOSE 190*  BUN 53*  CREATININE 2.12*  CALCIUM 9.6   GFR: Estimated Creatinine Clearance: 33.2 mL/min (A) (by C-G formula based on SCr of 2.12 mg/dL (H)). Liver Function Tests: Recent Labs  Lab 10/17/2019 0537  AST 40  ALT 26  ALKPHOS 110  BILITOT 0.9  PROT 7.4  ALBUMIN 3.0*   No results for input(s): LIPASE, AMYLASE in the last 168 hours. No results for input(s): AMMONIA in the last 168 hours. Coagulation Profile: No results for input(s): INR, PROTIME in the last 168 hours. Cardiac Enzymes: No results for input(s): CKTOTAL, CKMB, CKMBINDEX, TROPONINI in the last 168 hours. BNP (last 3 results) No results for input(s): PROBNP in the last 8760 hours. HbA1C: No results for input(s): HGBA1C in the last 72 hours. CBG: Recent Labs  Lab  10/23/19 1143 10/23/19 1623  GLUCAP 150* 148*   Lipid Profile: Recent Labs    10/17/2019 0537  TRIG 230*   Thyroid Function Tests: No results for input(s): TSH, T4TOTAL, FREET4, T3FREE, THYROIDAB in the last 72 hours. Anemia Panel: Recent Labs    10/13/2019 0537  FERRITIN 109   Urine analysis:    Component Value Date/Time   COLORURINE YELLOW 05/21/2011 0846   APPEARANCEUR CLEAR 05/21/2011 0846   LABSPEC 1.012 05/21/2011 0846   PHURINE 6.5 05/21/2011 0846   GLUCOSEU 100 (A) 05/21/2011 0846   HGBUR NEGATIVE 05/21/2011 0846   BILIRUBINUR NEGATIVE 05/21/2011 0846   KETONESUR NEGATIVE 05/21/2011 0846   PROTEINUR 100 (A) 05/21/2011 0846   UROBILINOGEN 1.0 05/21/2011 0846   NITRITE NEGATIVE 05/21/2011 0846   LEUKOCYTESUR NEGATIVE 05/21/2011 0846   Radiological Exams on Admission: DG Chest Port 1 View  Result Date: 10/27/2019 CLINICAL DATA:  73 year old male recently discharged from COVID-19 hospitalization. Increasing shortness of breath overnight. EXAM: PORTABLE CHEST 1 VIEW COMPARISON:  10/14/2019 portable chest and earlier. FINDINGS: Portable AP upright view at 0537 hours. Or lower lung volumes. Patchy and confluent bilateral peripheral and basilar opacity is increased compared to 10/14/2019. Stable cardiac size and mediastinal contours. Prior CABG. No pneumothorax. No pleural effusion is evident. Negative visible bowel gas pattern. Stable visualized osseous structures. IMPRESSION: Worsening bilateral peripheral and basilar pulmonary opacity since 10/14/2019 compatible with worsening pneumonia. No pleural effusion is evident. Electronically Signed   By: Genevie Ann M.D.   On: 10/07/2019 06:06   EKG: Independently reviewed.  Sinus tachycardia  Assessment/Plan Principal Problem:   Acute respiratory failure with  hypoxia ([place]) Active Problems:   Coronary artery disease   Hyperlipidemia   OSA (obstructive sleep apnea)   Diabetes mellitus type 2 in obese (HCC)   Acute hypoxemic  respiratory failure due to COVID-19 [facility])   DVT (deep venous thrombosis) (HCC)   GERD (gastroesophageal reflux disease)   Pulmonary embolus   Coagulopathy (HCC)   Anticoagulated   Chronic kidney disease   Chronic systolic heart failure (HCC)   AKI (acute kidney injury) (HCC)   Tachypnea   1. Acute on chronic respiratory failure with hypoxia -unfortunately the patient has continued to decompensate on the 15 L nonrebreather and now is being intubated.  He has been started on IV steroids and IV Zosyn a lot of concerns that he may have aspirated.  He says that he has been taking apixaban regularly as treatment for presumed pulmonary embolus diagnosed during his recent hospitalization at the [facility] hospital.  He is being admitted to the intensive care unit at the [facility] hospital.  I conferenced with Dr. Sabra Heck the ED physician and we both agree that the patient is decompensated to the point where he will require intubation at this time.  I called and spoke with Dr. Lake Bells who agreed to accept the patient. 2. covid pneumonia - he was recently discharged from [facility] where he had received steroids, Actemra and convalescent plasma and remdesivir.  He has been restarted on steroids.  3. Acute bilateral lower extremity DVT and clinically suspected pulmonary embolism-this was diagnosed during his recent hospitalization and he had been treated with oral apixaban.  He says that he has not missed doses.  Will ask pharmacy to place him on heparin drip.   4. Suspected aspiration pneumonia-he has been started on IV Zosyn per pharmacy. 5. CAD - stable, no chest pain symptoms. 6. HFrEF - recent echo with EF 40-45%, he was given IV lasix x 1 dose in ED.   7. Type 2 DM -he tends to have hyperglycemia from steroids will order standard SSI coverage and CBG testing every 4 hours and adjust as needed for glycemic control.  DVT prophylaxis: heparin   Code Status: full   Family Communication: Wife updated by telephone,  verbalizes understanding has been intubated and transferring to ICU that the [facility] hospital. Disposition Plan: Admit to ICU Consults called: Pulmonary critical care Admission status: Inpatient  Critical Care Procedure Note Authorized and Performed by: Murvin Natal MD  Total Critical Care time:  55 minutes  Due to a high probability of clinically significant, life threatening deterioration, the patient required my highest level of preparedness to intervene emergently and I personally spent this critical care time directly and personally managing the patient.  This critical care time included obtaining a history; examining the patient, pulse oximetry; ordering and review of studies; arranging urgent treatment with development of a management plan; evaluation of patient's response of treatment; frequent reassessment; and discussions with other providers.  This critical care time was performed to assess and manage the high probability of imminent and life threatening deterioration that could result in multi-organ failure.  It was exclusive of separately billable procedures and treating other patients and teaching time.   Irwin Brakeman MD [facility] How to contact the [facility] Attending or Consulting provider [place] or covering provider during after hours [place], for this patient?  1. Check the care team in [facility] and look for a) attending/consulting [facility] provider listed and b) the [facility] team listed 2. Log into [email or web address] and use [place]'s  universal password to access. If you do not have the password, please contact the hospital operator. 3. Locate the [facility] provider you are looking for under [facility] and page to a number that you can be directly reached. 4. If you still have difficulty reaching the provider, please page the [facility] (Director on Call) for the Hospitalists listed on amion for assistance.   If 7PM-7AM, please contact night-coverage [email or web address] Password [email or web address]  10/24/2019,  9:12 AM

## 2019-10-30 NOTE — ED Triage Notes (Signed)
Pt dc'd from Matthew Bates on Saturday after Covid admission.  Pt at home doing okay until tonight when breathing worsened.

## 2019-10-30 NOTE — ED Provider Notes (Signed)
[facility] EMERGENCY DEPARTMENT Provider Note   CSN: [id number] Arrival date & time: 10/06/2019  [id number]     History Chief Complaint  Patient presents with  . Shortness of Breath    Covid +    Matthew Bates is a 73 y.o. male.  HPI     Patient presents from home with concern for respiratory distress. Patient has a notable history of Covid virus pneumonia, was diagnosed, admitted, and discharged 1 week ago. He arrives via EMS, and their personnel assist with the HPI, as the patient is dyspneic, and there is a level 5 caveat secondary to acuity of condition. Seemingly the patient was discharged, and even on discharge was persistently dyspneic.  This has become worse possibly over the last day, though exact onset of progression is unclear.  No new fever, vomiting, no focal pain.  Patient is taking all of his medication as directed, including blood thinning medication for history of DVT during his recent hospitalization. EMS personnel note that on arrival the patient was dyspneic, tachypneic, had saturation of 76% on room air.  On this was improved with 15 L via high flow nasal cannula, and the patient arrives on a nonrebreather mask.  Past Medical History:  Diagnosis Date  . Anemia   . CAD (coronary artery disease)   . Carotid artery occlusion   . Cataract   . Chronic kidney disease   . COVID-19   . DM (diabetes mellitus) ([place])   . GERD (gastroesophageal reflux disease)   . Gout   . Hepatitis    as a child  . High triglycerides   . HTN (hypertension)   . Obesity     Patient Active Problem List   Diagnosis Date Noted  . Acute respiratory failure due to COVID-19 ([place]) 10/11/2019  . Acute hypoxemic respiratory failure due to COVID-19 ([place]) 10/10/2019  . Acute respiratory disease due to COVID-19 virus 10/09/2019  . Diabetes mellitus type 2 in obese ([place]) 10/09/2019  . Aftercare following surgery of the circulatory system, [place] 10/15/2013  . Occlusion and stenosis of carotid  artery without mention of cerebral infarction 09/20/2011  . Carotid stenosis, bilateral 09/20/2011  . Hypertension 04/29/2011  . Coronary artery disease 04/29/2011  . Hyperlipidemia 04/29/2011  . Carotid stenosis 04/29/2011  . OSA (obstructive sleep apnea) 04/29/2011    Past Surgical History:  Procedure Laterality Date  . ANGIOPLASTY     --cardiac  . APPENDECTOMY    . CAROTID ENDARTERECTOMY  05/22/11   Left CEA  . CATARACT EXTRACTION Bilateral   . COLONOSCOPY  10-09-13  . KNEE SURGERY Right   . PR VEIN BYPASS GRAFT,AORTO-FEM-POP  2010  . spleen surgery         Family History  Problem Relation Age of Onset  . Heart attack Father   . Hyperlipidemia Father   . Hypertension Father   . Heart disease Father        Heart Disease before age 40- PVD  . Diabetes Brother   . Heart disease Brother        Heart Disese before age 62  . Hyperlipidemia Brother   . Hypertension Brother   . Leukemia Brother   . Heart disease Mother        Heart Disease before age 25  . Hyperlipidemia Mother   . Hypertension Mother   . Heart attack Mother   . Coronary artery disease Other   . Colon cancer Neg Hx   . Esophageal cancer Neg Hx   .  Pancreatic cancer Neg Hx   . Stomach cancer Neg Hx   . Liver disease Neg Hx   . Rectal cancer Neg Hx     Social History   Tobacco Use  . Smoking status: Former Smoker    Types: Cigarettes    Quit date: 11/04/1970    Years since quitting: 49.0  . Smokeless tobacco: Never Used  Substance Use Topics  . Alcohol use: Yes    Comment: rare  . Drug use: No    Home Medications Prior to Admission medications   Medication Sig Start Date End Date Taking? Authorizing Provider  acetaminophen (TYLENOL) 325 MG tablet Take 2 tablets (650 mg total) by mouth every 6 (six) hours as needed for mild pain or headache (fever >/= 101). 10/23/19   Cherene Altes, MD  amLODipine (NORVASC) 10 MG tablet Take 1 tablet (10 mg total) by mouth daily. 10/24/19   Cherene Altes, MD  apixaban (ELIQUIS) 5 MG TABS tablet Take 2 tablets (10mg ) twice daily for 7 days, then 1 tablet (5mg ) twice daily 10/23/19   Cherene Altes, MD  aspirin EC 81 MG tablet Take 81 mg by mouth daily.    [provider]  Cyanocobalamin (B-12) 2500 MCG TABS Take 2,500 mcg by mouth daily.     [provider]  famotidine (PEPCID) 40 MG tablet Take 40 mg by mouth 2 (two) times daily.    [provider]  fenofibrate 160 MG tablet Take 1 tablet (160 mg total) by mouth daily. 12/04/11   Larey Dresser, MD  FERREX 150 150 MG capsule Take 150 mg by mouth daily.  07/20/19   [provider]  folic acid (FOLVITE) 1 MG tablet Take 1 mg by mouth daily.      [provider]  furosemide (LASIX) 40 MG tablet Take 1 tablet (40 mg total) by mouth daily. 10/25/19   Cherene Altes, MD  labetalol (NORMODYNE) 200 MG tablet TAKE 1 TABLET BY MOUTH TWICE A DAY Patient taking differently: Take 200 mg by mouth 3 (three) times daily.  08/02/15   Larey Dresser, MD  linagliptin (TRADJENTA) 5 MG TABS tablet Take 1 tablet (5 mg total) by mouth daily. 10/24/19   Cherene Altes, MD  metFORMIN (GLUCOPHAGE) 1000 MG tablet Take 1,000 mg by mouth 2 (two) times daily with a meal.  02/09/11   [provider]  Multiple Vitamin (MULTIVITAMIN) capsule Take 1 capsule by mouth daily.      [provider]  rosuvastatin (CRESTOR) 20 MG tablet Take 20 mg by mouth daily. 02/11/16   [provider]    Allergies    Metoclopramide hcl  Review of Systems   Review of Systems  Unable to perform ROS: Acuity of condition    Physical Exam Updated Vital Signs BP 118/67   Pulse 97   Temp 98.1 F (36.7 C) (Axillary)   Resp (!) 27   Wt 85.7 kg   SpO2 93%   BMI 27.90 kg/m   Physical Exam Vitals and nursing note reviewed.  Constitutional:      General: He is in acute distress.     Appearance: He is ill-appearing and diaphoretic.     Comments:  Ill-appearing elderly male awake and alert with nonrebreather mask in place  HENT:     Head: Normocephalic and atraumatic.  Eyes:     Conjunctiva/sclera: Conjunctivae normal.  Cardiovascular:     Rate and Rhythm: Regular rhythm. Tachycardia present.  Pulmonary:     Effort: Tachypnea, accessory muscle usage and respiratory distress present.     Breath sounds: Decreased breath sounds present. No wheezing.  Abdominal:     General: There is no distension.  Musculoskeletal:     Comments: No deformities  Skin:    General: Skin is warm.  Neurological:     Mental Status: He is alert and oriented to person, place, and time.     ED Results / Procedures / Treatments   Labs (all labs ordered are listed, but only abnormal results are displayed) Labs Reviewed  LACTIC ACID, PLASMA - Abnormal; Notable for the following components:      Result Value   Lactic Acid, Venous 2.5 (*)    All other components within normal limits  CBC WITH DIFFERENTIAL/PLATELET - Abnormal; Notable for the following components:   WBC 11.7 (*)    MCH 25.4 (*)    MCHC 29.9 (*)    RDW 19.3 (*)    Platelets 127 (*)    Neutro Abs 9.4 (*)    Monocytes Absolute 1.1 (*)    All other components within normal limits  COMPREHENSIVE METABOLIC PANEL - Abnormal; Notable for the following components:   CO2 21 (*)    Glucose, Bld 190 (*)    BUN 53 (*)    Creatinine, Ser 2.12 (*)    Albumin 3.0 (*)    GFR calc non Af Amer 30 (*)    GFR calc Af Amer 34 (*)    All other components within normal limits  D-DIMER, QUANTITATIVE (NOT AT [facility]) - Abnormal; Notable for the following components:   D-Dimer, Quant 1.65 (*)    All other components within normal limits  LACTATE DEHYDROGENASE - Abnormal; Notable for the following components:   LDH 486 (*)    All other components within normal limits  TRIGLYCERIDES - Abnormal; Notable for the following components:   Triglycerides 230 (*)    All other components within normal limits   FIBRINOGEN - Abnormal; Notable for the following components:   Fibrinogen >800 (*)    All other components within normal limits  CULTURE, BLOOD (ROUTINE X 2)  CULTURE, BLOOD (ROUTINE X 2)  PROCALCITONIN  LACTIC ACID, PLASMA  FERRITIN  C-REACTIVE PROTEIN  BRAIN NATRIURETIC PEPTIDE    EKG EKG Interpretation  Date/Time:  Saturday October 30 2019 05:21:21 EST Ventricular Rate:  101 PR Interval:    QRS Duration: 91 QT Interval:  365 QTC Calculation: 474 R Axis:   119 Text Interpretation: Sinus tachycardia Inferior infarct, age indeterminate Baseline wander in lead(s) V3 V4 Abnormal ECG Confirmed by Carmin Muskrat [phone]) on 10/09/2019 5:31:16 AM   Radiology DG Chest Port 1 View  Result Date: 10/22/2019 CLINICAL DATA:  73 year old male recently discharged from COVID-19 hospitalization. Increasing shortness of breath overnight. EXAM: PORTABLE CHEST 1 VIEW COMPARISON:  10/14/2019 portable chest and earlier. FINDINGS: Portable AP upright view at 0537 hours. Or lower lung volumes. Patchy and confluent bilateral peripheral and basilar opacity is increased compared to 10/14/2019. Stable cardiac size and mediastinal contours. Prior CABG. No pneumothorax. No pleural effusion is evident. Negative visible bowel gas pattern. Stable visualized osseous structures. IMPRESSION: Worsening bilateral peripheral and basilar pulmonary opacity since 10/14/2019 compatible with worsening pneumonia. No pleural effusion is evident. Electronically Signed   By: Genevie Ann M.D.   On: 10/26/2019 06:06    Procedures Procedures (including critical care time)  CRITICAL CARE Performed by: Carmin Muskrat Total critical care time: 35 minutes Critical  care time was exclusive of separately billable procedures and treating other patients. Critical care was necessary to treat or prevent imminent or life-threatening deterioration. Critical care was time spent personally by me on the following activities: development of  treatment plan with patient and/or surrogate as well as nursing, discussions with consultants, evaluation of patient's response to treatment, examination of patient, obtaining history from patient or surrogate, ordering and performing treatments and interventions, ordering and review of laboratory studies, ordering and review of radiographic studies, pulse oximetry and re-evaluation of patient's condition.  Medications Ordered in ED Medications  albuterol (VENTOLIN HFA) 108 (90 Base) MCG/ACT inhaler (8 puffs  Given 10/19/2019 0530)    ED Course  I have reviewed the triage vital signs and the nursing notes.  Pertinent labs & imaging results that were available during my care of the patient were reviewed by me and considered in my medical decision making (see chart for details).    MDM Rules/Calculators/A&P                     After the initial evaluation, with concern for respiratory distress, recent Covid infection, the patient continued receiving supplemental oxygen, broad differential considered including pneumonia, complications of Covid, or pulmonary embolism Patient did have ultrasound with DVT evidence within the past month, with presumption of PE, was started on Eliquis. Denying chest pain currently, and with reported medication compliance, PE is less likely, though remains a consideration.   6:13 AM Patient at 95% on nonrebreather mask, much more comfortable than on arrival.  This adult male presents less than 1 week after being discharged from our affiliated facility for coronavirus patient is now with increased work of breathing, dyspnea, hypoxia Patient plan of worsening pneumonia bilaterally. Given the patient's known Covid infection, new oxygen requirement, he requires admission for further monitoring, management.  Patient is labs consistent with ongoing Covid infection with LDH elevated, fibrinogen elevated, lactic acidosis, worsening renal function.  However, the patient's  D-dimer is actually trending down, though this prognostic usefulness is questionable.   Final Clinical Impression(s) / ED Diagnoses Final diagnoses:  Pneumonia due to COVID-19 virus  Respiratory distress      Carmin Muskrat, MD 10/24/2019 [phone]

## 2019-10-30 NOTE — ED Notes (Signed)
Date and time results received: 10/29/2019 0908 (use smartphrase ".now" to insert current time)  Test: lac acid Critical Value: 2.0  Name of Provider Notified: miller  Orders Received? Or Actions Taken?: see chart

## 2019-10-30 NOTE — H&P (Signed)
NAME:  Matthew Bates, MRN:  [id number], DOB:  1945-11-05, LOS: 0 ADMISSION DATE:  10/28/2019, CONSULTATION DATE:  10/31/19 CHIEF COMPLAINT:  Cardiac arrest  Brief History   This is a 73 yo with recent hospitalization for covid PNA who presents sp PEA and vtach arrest  History of present illness   This is a 73 yo male with history of HFrER, CAD, HTN, GERD, DM, and who presents sp cardiac arrest. The story is the patient was recently discharged from [facility] on the [facility]th. Patient had been hospitalized since the 5th for COVID PNA. He had completed his course of remdesivir and steroids. Was noted to have DVT and high supicion for PE and thus patient was sent home with [facility]. Patient had been sent home with eliquis. Patient had been getting progressively SOB EMS was called and found patient to severely SOB and with pulse ox of 76%. Had been seen at outside ED and intubated for this. Was en rout to be transported to [facility]. Patient had bradycardic event followed by PEA. He was than noted to go into PEA.Marland Kitchen PEA waqs followed by V tach. V tach was defibrillated. PAtietn than went back to ED and was given atropine for initial bradycardia followed by non sustained vtach for which the patient received amiodarone. Central acces was attained after which the patient was transferred to [facility]  Past Medical History  CAD Cataract CKD DM GERD GOUT Hepatitis High triglycerides HTN Obesity  Significant Hospital Events   Patient admitted sp cardiac arrest   Consults:  cardiology  Procedures:  Central line and intubation performed at outside ED on 12/26  Significant Diagnostic Tests:  EKG with no ST segment deviation  Micro Data:  Cultures pending  Antimicrobials:  Vanc and cefepime  Interim history/subjective:  NA  Objective   Blood pressure (!) 103/59, pulse (!) 104, temperature 98.2 F (36.8 C), temperature source Bladder, resp. rate (!) 27, height 5\' 9"  (1.753 m), weight 75.2 kg,  SpO2 100 %.    Vent Mode: PRVC FiO2 (%):  [60 %-100 %] 60 % Set Rate:  [16 bmp-26 bmp] 26 bmp Vt Set:  [420 mL-560 mL] 560 mL PEEP:  [5 cmH20-8 cmH20] 5 cmH20 Plateau Pressure:  [22 cmH20-35 cmH20] 35 cmH20   Intake/Output Summary (Last 24 hours) at 10/13/2019 2348 Last data filed at 10/21/2019 2300 Gross per 24 hour  Intake 9.04 ml  Output 200 ml  Net -190.96 ml   Filed Weights   10/26/2019 0517 10/28/2019 2255  Weight: 85.7 kg 75.2 kg    Examination: General: Patient intubated and following commands HENT: Patient with moist mucous membranes Lungs: with mechanical breath sounds. No rales, crackles  Cardiovascular: RRR. No murmurs noted Abdomen: Soft non tender and non distended Extremities: Patient able to follow commands in both upper and lower extremities Neuro: Corneal present. Patient triggering breaths on the vent GU: Foley in place  Resolved Hospital Problem list   NA  Assessment & Plan:  This is a 73 yo male with history as noted above who presents sp cardiac arrest.    V tach arrest-Patient with previous cardiac history. Previous CABG in 2010 with angioplasty prior to this. Patietn after my exam did follow commands for nurse and we verified this -No indication for cooling as patient following commands -Heparin drip after CT head -Echocardiogram in AM -Levophed, POCUS without signs of direct cardiogenic shock -Cards consult  Hypoxic respiratory failure-Could be 2/2 superimposed PNA after covid vs worsening PE -  Heparin drip for PE -Broad spectrum abx -Continue vent. With current setting wean Os attain ABG and art line  Covid PNA-Hx of. Latest Covid test on 10/09/19 -Isolation can likely be DC'd on 12/26. Will discuss with infection control regarding precautions in AM -No specific therapy at this time  Likely anoxic brain injury -CT head -Intubation    DM-SSI  CAD -Continue     Best practice:  Diet: NPO for now Pain/Anxiety/Delirium protocol (if  indicated):  VAP protocol (if indicated): HOB 30 degrees DVT prophylaxis: Heparin drip GI prophylaxis: protonix Glucose control: insulin Mobility: as tolerated Code Status: Full code per discussion with wife Family Communication: With wife. Number ion MAR Disposition: ICU  Labs   CBC: Recent Labs  Lab 10/05/2019 0537 10/05/2019 2306  WBC 11.7*  --   NEUTROABS 9.4*  --   HGB 13.2 11.9*  HCT 44.1 35.0*  MCV 84.8  --   PLT 127*  --     Basic Metabolic Panel: Recent Labs  Lab 10/05/2019 0537 10/09/2019 1714 10/20/2019 2017 10/27/2019 2306  NA 138 138  --  138  K 4.8 7.1* 5.7* 5.5*  CL 106 106  --   --   CO2 21* 21*  --   --   GLUCOSE 190* 284*  --   --   BUN 53* 62*  --   --   CREATININE 2.12* 3.02*  --   --   CALCIUM 9.6 8.3*  --   --    GFR: Estimated Creatinine Clearance: 21.5 mL/min (A) (by C-G formula based on SCr of 3.02 mg/dL (H)). Recent Labs  Lab 10/24/2019 0537 10/19/2019 0819  PROCALCITON 0.28  --   WBC 11.7*  --   LATICACIDVEN 2.5* 2.0*    Liver Function Tests: Recent Labs  Lab 10/27/2019 0537  AST 40  ALT 26  ALKPHOS 110  BILITOT 0.9  PROT 7.4  ALBUMIN 3.0*   No results for input(s): LIPASE, AMYLASE in the last 168 hours. No results for input(s): AMMONIA in the last 168 hours.  ABG    Component Value Date/Time   PHART 7.206 (L) 10/29/2019 2306   PCO2ART 61.5 (H) 10/29/2019 2306   PO2ART 159.0 (H) 10/06/2019 2306   HCO3 24.4 10/16/2019 2306   TCO2 26 10/12/2019 2306   ACIDBASEDEF 4.0 (H) 10/29/2019 2306   O2SAT 99.0 10/05/2019 2306     Coagulation Profile: No results for input(s): INR, PROTIME in the last 168 hours.  Cardiac Enzymes: No results for input(s): CKTOTAL, CKMB, CKMBINDEX, TROPONINI in the last 168 hours.  HbA1C: Hgb A1c MFr Bld  Date/Time Value Ref Range Status  10/09/2019 05:41 PM 7.2 (H) 4.8 - 5.6 % Final    Comment:    (NOTE) Pre diabetes:          5.7%-6.4% Diabetes:              >6.4% Glycemic control for    <7.0% adults with diabetes   03/20/2009 03:54 PM (H) 4.6 - 6.1 % Final   6.6 (NOTE) The ADA recommends the following therapeutic goal for glycemic control related to Hgb A1c measurement: Goal of therapy: <6.5 Hgb A1c  Reference: American Diabetes Association: Clinical Practice Recommendations 2010, Diabetes Care, 2010, 33: (Suppl  1).    CBG: Recent Labs  Lab 10/28/2019 2007 10/25/2019 2254  GLUCAP 299* 370*    Review of Systems:   Unable to attain as patient intubated and sedated  Past Medical History  He,  has a  past medical history of Anemia, CAD (coronary artery disease), Carotid artery occlusion, Cataract, Chronic kidney disease, COVID-19, DM (diabetes mellitus) ([place]), GERD (gastroesophageal reflux disease), Gout, Hepatitis, High triglycerides, HTN (hypertension), and Obesity.   Surgical History    Past Surgical History:  Procedure Laterality Date  . ANGIOPLASTY     --cardiac  . APPENDECTOMY    . CAROTID ENDARTERECTOMY  05/22/11   Left CEA  . CATARACT EXTRACTION Bilateral   . COLONOSCOPY  10-09-13  . KNEE SURGERY Right   . PR VEIN BYPASS GRAFT,AORTO-FEM-POP  2010  . spleen surgery       Social History   reports that he quit smoking about 49 years ago. His smoking use included cigarettes. He has never used smokeless tobacco. He reports current alcohol use. He reports that he does not use drugs.   Family History   His family history includes Coronary artery disease in an other family member; Diabetes in his brother; Heart attack in his father and mother; Heart disease in his brother, father, and mother; Hyperlipidemia in his brother, father, and mother; Hypertension in his brother, father, and mother; Leukemia in his brother. There is no history of Colon cancer, Esophageal cancer, Pancreatic cancer, Stomach cancer, Liver disease, or Rectal cancer.   Allergies Allergies  Allergen Reactions  . Metoclopramide Hcl Hypertension    Reglan=Increases HBP symptoms     Home  Medications  Prior to Admission medications   Medication Sig Start Date End Date Taking? Authorizing Provider  acetaminophen (TYLENOL) 325 MG tablet Take 2 tablets (650 mg total) by mouth every 6 (six) hours as needed for mild pain or headache (fever >/= 101). 10/23/19  Yes Cherene Altes, MD  amLODipine (NORVASC) 10 MG tablet Take 1 tablet (10 mg total) by mouth daily. 10/24/19  Yes Cherene Altes, MD  apixaban (ELIQUIS) 5 MG TABS tablet Take 2 tablets (10mg ) twice daily for 7 days, then 1 tablet (5mg ) twice daily 10/23/19  Yes Cherene Altes, MD  aspirin EC 81 MG tablet Take 81 mg by mouth daily.   Yes [provider]  Cyanocobalamin (B-12) 2500 MCG TABS Take 2,500 mcg by mouth daily.    Yes [provider]  famotidine (PEPCID) 40 MG tablet Take 40 mg by mouth 2 (two) times daily.   Yes [provider]  fenofibrate 160 MG tablet Take 1 tablet (160 mg total) by mouth daily. 12/04/11  Yes Larey Dresser, MD  FERREX 150 150 MG capsule Take 150 mg by mouth daily.  07/20/19  Yes [provider]  folic acid (FOLVITE) 1 MG tablet Take 1 mg by mouth daily.     Yes [provider]  furosemide (LASIX) 40 MG tablet Take 1 tablet (40 mg total) by mouth daily. 10/25/19  Yes Cherene Altes, MD  labetalol (NORMODYNE) 200 MG tablet TAKE 1 TABLET BY MOUTH TWICE A DAY Patient taking differently: Take 200 mg by mouth 3 (three) times daily.  08/02/15  Yes Larey Dresser, MD  linagliptin (TRADJENTA) 5 MG TABS tablet Take 1 tablet (5 mg total) by mouth daily. 10/24/19  Yes Cherene Altes, MD  metFORMIN (GLUCOPHAGE) 1000 MG tablet Take 1,000 mg by mouth 2 (two) times daily with a meal.  02/09/11  Yes [provider]  Multiple Vitamin (MULTIVITAMIN) capsule Take 1 capsule by mouth daily.     Yes [provider]  rosuvastatin (CRESTOR) 20 MG tablet Take 20 mg by mouth daily. 02/11/16  Yes [provider]     Critical care time: 60  minutes

## 2019-10-30 NOTE — Progress Notes (Signed)
Pharmacy Antibiotic Note  Matthew Bates is a 73 y.o. male admitted on 10/12/2019 with pneumonia.  Pharmacy has been consulted for zosyn dosing.  Plan: Zosyn 3.375g IV q8h (4 hour infusion).  Weight: 188 lb 15 oz (85.7 kg)  Temp (24hrs), Avg:98.1 F (36.7 C), Min:98.1 F (36.7 C), Max:98.1 F (36.7 C)  Recent Labs  Lab 11/03/2019 0537 10/10/2019 0819  WBC 11.7*  --   CREATININE 2.12*  --   LATICACIDVEN 2.5* 2.0*    Estimated Creatinine Clearance: 33.2 mL/min (A) (by C-G formula based on SCr of 2.12 mg/dL (H)).    Allergies  Allergen Reactions  . Metoclopramide Hcl Hypertension    Reglan=Increases HBP symptoms    Antimicrobials this admission: 12/26 zosyn >>  Microbiology results: 12/26 BCx: sent    Thank you for allowing pharmacy to be a part of this patient's care.  [name] Alexx Giambra 10/16/2019 10:30 AM

## 2019-10-30 NOTE — ED Notes (Signed)
Date and time results received: 10/18/2019 1801 (use smartphrase ".now" to insert current time)  Test: k+ Critical Value: 7.1  Name of Provider Notified: long  Orders Received? Or Actions Taken?: see chart

## 2019-10-30 NOTE — ED Notes (Signed)
Date and time results received: 11/02/2019 1728 (use smartphrase ".now" to insert current time)  Test: ph 7.138 Critical Value: pco2 71.3 Name of Provider Notified: long  Orders Received? Or Actions Taken?: see chart

## 2019-10-30 NOTE — ED Notes (Signed)
Questioned patient about turning for now to side which allowed him to breath best. He at some point may need prone position. Patient may need intubation in near future. Patient on 15 lpm NRB mask

## 2019-10-30 NOTE — Progress Notes (Addendum)
ANTICOAGULATION CONSULT NOTE - Initial Consult  Pharmacy Consult for heparin gtt  Indication: DVT  Allergies  Allergen Reactions  . Metoclopramide Hcl Hypertension    Reglan=Increases HBP symptoms    Patient Measurements: Weight: 188 lb 15 oz (85.7 kg) Heparin Dosing Weight:     Vital Signs: Temp: 98.4 F (36.9 C) (12/26 1030) Temp Source: Axillary (12/26 0527) BP: 119/72 (12/26 1030) Pulse Rate: 50 (12/26 1030)  Labs: Recent Labs    10/09/2019 0537  HGB 13.2  HCT 44.1  PLT 127*  CREATININE 2.12*    Estimated Creatinine Clearance: 33.2 mL/min (A) (by C-G formula based on SCr of 2.12 mg/dL (H)).   Medical History: Past Medical History:  Diagnosis Date  . Anemia   . CAD (coronary artery disease)   . Carotid artery occlusion   . Cataract   . Chronic kidney disease   . COVID-19   . DM (diabetes mellitus) ([place])   . GERD (gastroesophageal reflux disease)   . Gout   . Hepatitis    as a child  . High triglycerides   . HTN (hypertension)   . Obesity     Medications:  (Not in a hospital admission)  Scheduled:  . heparin  4,000 Units Intravenous Once  . methylPREDNISolone (SOLU-MEDROL) injection  60 mg Intravenous Q12H   Infusions:  . heparin    . piperacillin-tazobactam (ZOSYN)  IV Stopped (10/14/2019 1045)  . propofol (DIPRIVAN) infusion 5 mcg/kg/min (10/11/2019 1004)   PRN:  Anti-infectives (From admission, onward)   Start     Dose/Rate Route Frequency Ordered Stop   10/31/2019 0830  piperacillin-tazobactam (ZOSYN) IVPB 3.375 g     3.375 g 12.5 mL/hr over 240 Minutes Intravenous Every 8 hours 10/24/2019 0818        Assessment: Matthew Bates a 73 y.o. male requires anticoagulation with a heparin iv infusion for the indication of  DVT. Heparin gtt will be started following pharmacy protocol per pharmacy consult. Patient is on previous oral anticoagulant, apixaban, that will require aPTT/HL correlation before transitioning to only HL monitoring. Pt HL was  therapeutic early December at [facility] on heparin 1000 units/hr.   Goal of Therapy:  Heparin level 0.3-0.7 units/ml aPTT 66-102 seconds Monitor platelets by anticoagulation protocol: Yes   Plan:  Give 4000 units bolus x 1 Start heparin infusion at 1000 units/hr Check anti-Xa level in 8 hours and daily while on heparin Continue to monitor H&H and platelets  Heparin level to be drawn in 8 hours for patients >73 years old Matthew Bates Matthew Bates 10/20/2019,11:01 AM

## 2019-10-30 NOTE — ED Notes (Signed)
Triple lumen central line placed in right femoral.

## 2019-10-30 NOTE — ED Notes (Addendum)
Report given to [facility] and wife updated.

## 2019-10-30 NOTE — Progress Notes (Signed)
ANTICOAGULATION CONSULT NOTE - Initial Consult  Pharmacy Consult for heparin gtt  Indication: DVT  Allergies  Allergen Reactions  . Metoclopramide Hcl Hypertension    Reglan=Increases HBP symptoms    Patient Measurements: Weight: 188 lb 15 oz (85.7 kg) Heparin Dosing Weight:     Vital Signs: Temp: 97.9 F (36.6 C) (12/26 1900) BP: 113/84 (12/26 1900) Pulse Rate: 115 (12/26 1900)  Labs: Recent Labs    10/18/2019 0537 10/28/2019 1714  HGB 13.2  --   HCT 44.1  --   PLT 127*  --   APTT  --  168*  HEPARINUNFRC  --  >2.20*  CREATININE 2.12* 3.02*    Estimated Creatinine Clearance: 23.3 mL/min (A) (by C-G formula based on SCr of 3.02 mg/dL (H)).   Medical History: Past Medical History:  Diagnosis Date  . Anemia   . CAD (coronary artery disease)   . Carotid artery occlusion   . Cataract   . Chronic kidney disease   . COVID-19   . DM (diabetes mellitus) ([place])   . GERD (gastroesophageal reflux disease)   . Gout   . Hepatitis    as a child  . High triglycerides   . HTN (hypertension)   . Obesity     Medications:  (Not in a hospital admission)  Scheduled:  . atropine      . methylPREDNISolone (SOLU-MEDROL) injection  60 mg Intravenous Q12H  . vasopressin  40 Units Intravenous Once   Infusions:  . epinephrine 20 mcg/min (11/03/2019 1912)  . heparin    . norepinephrine (LEVOPHED) Adult infusion    . piperacillin-tazobactam (ZOSYN)  IV Stopped (10/06/2019 1045)  . propofol (DIPRIVAN) infusion Stopped (10/07/2019 1335)  .  sodium bicarbonate  infusion 1000 mL     PRN:  Anti-infectives (From admission, onward)   Start     Dose/Rate Route Frequency Ordered Stop   10/09/2019 0830  piperacillin-tazobactam (ZOSYN) IVPB 3.375 g     3.375 g 12.5 mL/hr over 240 Minutes Intravenous Every 8 hours 10/06/2019 0818        Assessment: Matthew Bates a 73 y.o. male requires anticoagulation with a heparin iv infusion for the indication of  DVT. Heparin gtt will be started following  pharmacy protocol per pharmacy consult. Patient is on previous oral anticoagulant, apixaban, that will require aPTT/HL correlation before transitioning to only HL monitoring. Pt HL was therapeutic early December at [facility] on heparin 1000 units/hr.  aptt 168, supratherapeutic    Goal of Therapy:  Heparin level 0.3-0.7 units/ml aPTT 66-102 seconds Monitor platelets by anticoagulation protocol: Yes   Plan:  Hold heparin 1 hour  restart heparin infusion at 600 units/hr  Check anti-Xa level in 8 hours and daily while on heparin Continue to monitor H&H and platelets   [name] Mali Eppard 10/23/2019,7:14 PM

## 2019-10-30 NOTE — ED Notes (Signed)
Patient left unit with [facility]

## 2019-10-30 NOTE — ED Notes (Signed)
Patient given a 500 bolus by EMS.

## 2019-10-30 NOTE — ED Provider Notes (Signed)
I was called to the bedside of this patient who has decompensated.  He has presented with Covid pneumonia, he had been admitted, was discharged and now is back with worsening shortness of breath with bilateral infiltrates on the x-ray which seem to be worsening.  On my exam the patient's oxygen levels are between 84 to 86% on the high flow nonrebreather, he is tachypneic to 35 breaths/min, he is only able to speak in 1-2 word sentences.  His pulmonary exam is very abnormal with rales which are loud and heavy on the right, very diminished lung sounds on the left, the patient is requiring increased support and has unfortunately developed severe weakness and feels like he is running out of energy.  After discussion with the hospitalist and the patient the plan is to go forward with intubation, the patient is aware of the risks benefits and alternatives and at this time feels like he cannot continue to breathe by himself.  The decision was made to intubate and at 10:00 AM the patient was successfully intubated on the first attempt by direct laryngoscopy, see the note below.  This was communicated with the hospitalist who is in agreement, they are in communication with the accepting intensivist at [facility].  The patient is currently on a propofol drip for sedation, he is normotensive at this time, oxygen has improved significantly, post intubation x-ray has been ordered.  I continue to care for this patient throughout the shift, multiple hours in the department awaiting transfer to ICU higher level of care hospital at [facility].  CRITICAL CARE Performed by: Johnna Acosta Total critical care time: 35 minutes Critical care time was exclusive of separately billable procedures and treating other patients. Critical care was necessary to treat or prevent imminent or life-threatening deterioration. Critical care was time spent personally by me on the following activities: development of treatment  plan with patient and/or surrogate as well as nursing, discussions with consultants, evaluation of patient's response to treatment, examination of patient, obtaining history from patient or surrogate, ordering and performing treatments and interventions, ordering and review of laboratory studies, ordering and review of radiographic studies, pulse oximetry and re-evaluation of patient's condition.  .Critical Care Performed by: Noemi Chapel, MD Authorized by: Noemi Chapel, MD   Critical care provider statement:    Critical care time (minutes):  35   Critical care time was exclusive of:  Separately billable procedures and treating other patients and teaching time   Critical care was necessary to treat or prevent imminent or life-threatening deterioration of the following conditions:  Respiratory failure   Critical care was time spent personally by me on the following activities:  Blood draw for specimens, development of treatment plan with patient or surrogate, discussions with consultants, evaluation of patient's response to treatment, examination of patient, obtaining history from patient or surrogate, ordering and performing treatments and interventions, ordering and review of laboratory studies, ordering and review of radiographic studies, pulse oximetry, re-evaluation of patient's condition and review of old charts   I assumed direction of critical care for this patient from another provider in my specialty: yes   Procedure Name: Intubation Date/Time: 10/16/2019 10:04 AM Performed by: Noemi Chapel, MD Pre-anesthesia Checklist: Patient identified, Patient being monitored, Emergency Drugs available, Timeout performed and Suction available Oxygen Delivery Method: Non-rebreather mask Preoxygenation: Pre-oxygenation with 100% oxygen Induction Type: Rapid sequence Ventilation: Mask ventilation without difficulty Laryngoscope Size: Mac and 4 Grade View: Grade II Tube size: 8.0 mm Number of  attempts: 1 Airway Equipment and Method: Stylet (direct laryngoscopy) Placement Confirmation: ETT inserted through vocal cords under direct vision,  CO2 detector and Breath sounds checked- equal and bilateral Secured at: 24 cm Tube secured with: ETT holder Dental Injury: Teeth and Oropharynx as per pre-operative assessment  Difficulty Due To: Difficulty was unanticipated Comments:      OG placement  Date/Time: 10/11/2019 10:36 AM Performed by: Noemi Chapel, MD Authorized by: Noemi Chapel, MD  Consent: Verbal consent obtained. Risks and benefits: risks, benefits and alternatives were discussed Consent given by: patient Required items: required blood products, implants, devices, and special equipment available Patient identity confirmed: verbally with patient Time out: Immediately prior to procedure a "time out" was called to verify the correct patient, procedure, equipment, support staff and site/side marked as required. Preparation: Patient was prepped and draped in the usual sterile fashion. Comments: The patient had rapid sequence intubation, was paralyzed and sedated with rocuronium and etomidate, see separate intubation note for those procedures.  X-ray ordered to confirm position, orogastric tube passed without difficulty        Noemi Chapel, MD 10/31/19 0700

## 2019-10-30 NOTE — Progress Notes (Signed)
[facility] Physician-Brief Progress Note Patient Name: Matthew Bates DOB: 26-Feb-1946 MRN: [id number]   Date of Service  10/24/2019  HPI/Events of Note  PT recently treated for Covid-19 pneumonia at [facility], then discharged home on long term anticoagulation for DVT. Pt returned to [facility] ED in respiratory distress, with inflammatory markers elevated, and was intubated in the ED, he was transferred to [place].  eICU Interventions  New patient evaluation completed.        [name] Ailie Gage 10/28/2019, 11:20 PM

## 2019-10-30 NOTE — ED Provider Notes (Signed)
Blood pressure (!) 68/41, pulse (!) 103, temperature 99.3 F (37.4 C), resp. rate (!) 26, weight 85.7 kg, SpO2 100 %.  In short, Matthew Bates is a 73 y.o. male with a chief complaint of Shortness of Breath (Covid +) .  Refer to the original H&P for additional details.  Patient with known COVID-19 and prior PE was being transported to [facility] after requiring intubation in the emergency department earlier this morning.  [facility] staff had bradycardia followed by PEA cardiac arrest.  They started chest compressions and gave 1 mg of epinephrine.  Patient then went into ventricular tachycardia and was defibrillated twice with ROSC.  They turned around and came back to the emergency department.   My initial exam the patient is hypertensive and had an additional episode of bradycardia.  He received 1 mg of atropine.  He had several runs of nonsustained ventricular tachycardia and received amiodarone.  Looking back at his ABG from 13:32 I elected to give 1 amp of bicarbonate.   05:17 PM Spoke with Dr. Nelda Marseille from critical care service regarding changing the bed request.  He agrees to accept the patient to the [facility] ICU but no beds currently.  They are in the process of cleaning beds and moving patients.  No ETA at this time.  They will change the bed request but patient still with some instability and requiring additional pressors.  Will call when more stable for transport and when bed is ready.   06:33 PM  Patient's repeat VBG with continued acidosis.  Patient given additional IV bicarb and will start a drip.  Calcium also elevated greater than 7.  Ordered insulin.  Patient is hyperglycemic.  Will follow regular CBGs.   07:14 PM  Adding Vasopressin with MAPs in the mid-50s. Levo at 60, Epi and 20. Some coffee-ground material coming from the OG now. Heparin held.   07:52 PM  Updated ICU regarding relative stability here and improved BP with now 3rd pressor.    Matthew Bates Line  Date/Time:  10/12/2019 6:32 PM Performed by: Margette Fast, MD Authorized by: Margette Fast, MD   Consent:    Consent obtained:  Emergent situation Pre-procedure details:    Hand hygiene: Hand hygiene performed prior to insertion     Sterile barrier technique: All elements of maximal sterile technique followed     Skin preparation:  2% chlorhexidine   Skin preparation agent: Skin preparation agent completely dried prior to procedure   Sedation:    Sedation type: intubated and sedated  Anesthesia (see MAR for exact dosages):    Anesthesia method:  Local infiltration   Local anesthetic:  Lidocaine 1% w/o epi Procedure details:    Location:  R femoral   Patient position:  Flat   Procedural supplies:  Triple lumen   Landmarks identified: yes     Ultrasound guidance: yes     Sterile ultrasound techniques: Sterile gel and sterile probe covers were used     Number of attempts:  1   Successful placement: yes   Post-procedure details:    Post-procedure:  Dressing applied and line sutured   Assessment:  Blood return through all ports and free fluid flow   Patient tolerance of procedure:  Tolerated well, no immediate complications .Critical Care Performed by: Margette Fast, MD Authorized by: Margette Fast, MD   Critical care provider statement:    Critical care time (minutes):  75   Critical care time was exclusive of:  Separately  billable procedures and treating other patients and teaching time   Critical care was necessary to treat or prevent imminent or life-threatening deterioration of the following conditions:  Respiratory failure, circulatory failure and metabolic crisis   Critical care was time spent personally by me on the following activities:  Blood draw for specimens, development of treatment plan with patient or surrogate, discussions with consultants, evaluation of patient's response to treatment, examination of patient, obtaining history from patient or surrogate, ordering and  performing treatments and interventions, ordering and review of laboratory studies, ordering and review of radiographic studies, pulse oximetry, re-evaluation of patient's condition, review of old charts and ventilator management   I assumed direction of critical care for this patient from another provider in my specialty: no         Hung Rhinesmith, Wonda Olds, MD 10/14/2019 2322

## 2019-10-30 NOTE — ED Notes (Signed)
10 units of novolog given

## 2019-10-30 NOTE — ED Notes (Signed)
Date and time results received: 10/23/2019 1758 (use smartphrase ".now" to insert current time)  Test: ptt Critical Value: 168  Name of Provider Notified: Dr. Laverta Baltimore  Orders Received? Or Actions Taken?:

## 2019-10-31 ENCOUNTER — Inpatient Hospital Stay (HOSPITAL_COMMUNITY): Payer: Medicare HMO

## 2019-10-31 DIAGNOSIS — L8990 Pressure ulcer of unspecified site, unspecified stage: Secondary | ICD-10-CM | POA: Insufficient documentation

## 2019-10-31 DIAGNOSIS — I469 Cardiac arrest, cause unspecified: Secondary | ICD-10-CM

## 2019-10-31 LAB — GLUCOSE, CAPILLARY
Glucose-Capillary: 316 mg/dL — ABNORMAL HIGH (ref 70–99)
Glucose-Capillary: 362 mg/dL — ABNORMAL HIGH (ref 70–99)
Glucose-Capillary: 299 mg/dL — ABNORMAL HIGH (ref 70–99)
Glucose-Capillary: 336 mg/dL — ABNORMAL HIGH (ref 70–99)
Glucose-Capillary: 260 mg/dL — ABNORMAL HIGH (ref 70–99)
Glucose-Capillary: 256 mg/dL — ABNORMAL HIGH (ref 70–99)
Glucose-Capillary: 219 mg/dL — ABNORMAL HIGH (ref 70–99)
Glucose-Capillary: 209 mg/dL — ABNORMAL HIGH (ref 70–99)
Glucose-Capillary: 178 mg/dL — ABNORMAL HIGH (ref 70–99)
Glucose-Capillary: 284 mg/dL — ABNORMAL HIGH (ref 70–99)
Glucose-Capillary: 292 mg/dL — ABNORMAL HIGH (ref 70–99)
Glucose-Capillary: 329 mg/dL — ABNORMAL HIGH (ref 70–99)

## 2019-10-31 LAB — POCT I-STAT 7, (LYTES, BLD GAS, ICA,H+H)
Acid-base deficit: 3 mmol/L — ABNORMAL HIGH (ref 0.0–2.0)
Bicarbonate: 24 mmol/L (ref 20.0–28.0)
Bicarbonate: 26.1 mmol/L (ref 20.0–28.0)
Calcium, Ion: 1.12 mmol/L — ABNORMAL LOW (ref 1.15–1.40)
Calcium, Ion: 1.03 mmol/L — ABNORMAL LOW (ref 1.15–1.40)
HCT: 33 % — ABNORMAL LOW (ref 39.0–52.0)
HCT: 31 % — ABNORMAL LOW (ref 39.0–52.0)
Hemoglobin: 11.2 g/dL — ABNORMAL LOW (ref 13.0–17.0)
Hemoglobin: 10.5 g/dL — ABNORMAL LOW (ref 13.0–17.0)
O2 Saturation: 93 %
O2 Saturation: 94 %
Patient temperature: 36.4
Patient temperature: 36.3
Potassium: 4.9 mmol/L (ref 3.5–5.1)
Potassium: 6 mmol/L — ABNORMAL HIGH (ref 3.5–5.1)
Sodium: 139 mmol/L (ref 135–145)
Sodium: 139 mmol/L (ref 135–145)
TCO2: 25 mmol/L (ref 22–32)
TCO2: 28 mmol/L (ref 22–32)
pCO2 arterial: 47.4 mmHg (ref 32.0–48.0)
pCO2 arterial: 46.9 mmHg (ref 32.0–48.0)
pH, Arterial: 7.31 — ABNORMAL LOW (ref 7.350–7.450)
pH, Arterial: 7.35 (ref 7.350–7.450)
pO2, Arterial: 71 mmHg — ABNORMAL LOW (ref 83.0–108.0)
pO2, Arterial: 73 mmHg — ABNORMAL LOW (ref 83.0–108.0)

## 2019-10-31 LAB — CBC WITH DIFFERENTIAL/PLATELET
Abs Immature Granulocytes: 0.08 10*3/uL — ABNORMAL HIGH (ref 0.00–0.07)
Basophils Absolute: 0 10*3/uL (ref 0.0–0.1)
Basophils Relative: 0 %
Eosinophils Absolute: 0 10*3/uL (ref 0.0–0.5)
Eosinophils Relative: 0 %
HCT: 34.3 % — ABNORMAL LOW (ref 39.0–52.0)
Hemoglobin: 10.2 g/dL — ABNORMAL LOW (ref 13.0–17.0)
Immature Granulocytes: 1 %
Lymphocytes Relative: 9 %
Lymphs Abs: 1.1 10*3/uL (ref 0.7–4.0)
MCH: 25.4 pg — ABNORMAL LOW (ref 26.0–34.0)
MCHC: 29.7 g/dL — ABNORMAL LOW (ref 30.0–36.0)
MCV: 85.3 fL (ref 80.0–100.0)
Monocytes Absolute: 0.9 10*3/uL (ref 0.1–1.0)
Monocytes Relative: 8 %
Neutro Abs: 9.5 10*3/uL — ABNORMAL HIGH (ref 1.7–7.7)
Neutrophils Relative %: 82 %
Platelets: 114 10*3/uL — ABNORMAL LOW (ref 150–400)
RBC: 4.02 MIL/uL — ABNORMAL LOW (ref 4.22–5.81)
RDW: 18.7 % — ABNORMAL HIGH (ref 11.5–15.5)
WBC: 11.6 10*3/uL — ABNORMAL HIGH (ref 4.0–10.5)
nRBC: 0 % (ref 0.0–0.2)

## 2019-10-31 LAB — COMPREHENSIVE METABOLIC PANEL
ALT: 56 U/L — ABNORMAL HIGH (ref 0–44)
AST: 111 U/L — ABNORMAL HIGH (ref 15–41)
Albumin: 2.1 g/dL — ABNORMAL LOW (ref 3.5–5.0)
Alkaline Phosphatase: 98 U/L (ref 38–126)
Anion gap: 15 (ref 5–15)
BUN: 69 mg/dL — ABNORMAL HIGH (ref 8–23)
CO2: 21 mmol/L — ABNORMAL LOW (ref 22–32)
Calcium: 7.9 mg/dL — ABNORMAL LOW (ref 8.9–10.3)
Chloride: 103 mmol/L (ref 98–111)
Creatinine, Ser: 3.3 mg/dL — ABNORMAL HIGH (ref 0.61–1.24)
GFR calc Af Amer: 20 mL/min — ABNORMAL LOW (ref >60)
GFR calc non Af Amer: 17 mL/min — ABNORMAL LOW (ref >60)
Glucose, Bld: 423 mg/dL — ABNORMAL HIGH (ref 70–99)
Potassium: 5.4 mmol/L — ABNORMAL HIGH (ref 3.5–5.1)
Sodium: 139 mmol/L (ref 135–145)
Total Bilirubin: 1.4 mg/dL — ABNORMAL HIGH (ref 0.3–1.2)
Total Protein: 5.9 g/dL — ABNORMAL LOW (ref 6.5–8.1)

## 2019-10-31 LAB — CBC
HCT: 37.6 % — ABNORMAL LOW (ref 39.0–52.0)
Hemoglobin: 11 g/dL — ABNORMAL LOW (ref 13.0–17.0)
MCH: 25.7 pg — ABNORMAL LOW (ref 26.0–34.0)
MCHC: 29.3 g/dL — ABNORMAL LOW (ref 30.0–36.0)
MCV: 87.9 fL (ref 80.0–100.0)
Platelets: 133 10*3/uL — ABNORMAL LOW (ref 150–400)
RBC: 4.28 MIL/uL (ref 4.22–5.81)
RDW: 18.9 % — ABNORMAL HIGH (ref 11.5–15.5)
WBC: 11.9 10*3/uL — ABNORMAL HIGH (ref 4.0–10.5)
nRBC: 0 % (ref 0.0–0.2)

## 2019-10-31 LAB — BASIC METABOLIC PANEL
Anion gap: 13 (ref 5–15)
BUN: 68 mg/dL — ABNORMAL HIGH (ref 8–23)
CO2: 22 mmol/L (ref 22–32)
Calcium: 7.7 mg/dL — ABNORMAL LOW (ref 8.9–10.3)
Chloride: 106 mmol/L (ref 98–111)
Creatinine, Ser: 3.33 mg/dL — ABNORMAL HIGH (ref 0.61–1.24)
GFR calc Af Amer: 20 mL/min — ABNORMAL LOW (ref >60)
GFR calc non Af Amer: 17 mL/min — ABNORMAL LOW (ref >60)
Glucose, Bld: 326 mg/dL — ABNORMAL HIGH (ref 70–99)
Potassium: 4.4 mmol/L (ref 3.5–5.1)
Sodium: 141 mmol/L (ref 135–145)

## 2019-10-31 LAB — MRSA PCR SCREENING: MRSA by PCR: NEGATIVE

## 2019-10-31 LAB — PROTIME-INR
INR: 3.9 — ABNORMAL HIGH (ref 0.8–1.2)
Prothrombin Time: 38.1 seconds — ABNORMAL HIGH (ref 11.4–15.2)

## 2019-10-31 LAB — APTT
aPTT: 40 seconds — ABNORMAL HIGH (ref 24–36)
aPTT: 74 seconds — ABNORMAL HIGH (ref 24–36)
aPTT: 81 seconds — ABNORMAL HIGH (ref 24–36)

## 2019-10-31 LAB — D-DIMER, QUANTITATIVE: D-Dimer, Quant: 1.78 ug/mL-FEU — ABNORMAL HIGH (ref 0.00–0.50)

## 2019-10-31 LAB — MAGNESIUM
Magnesium: 2.1 mg/dL (ref 1.7–2.4)
Magnesium: 2.2 mg/dL (ref 1.7–2.4)
Magnesium: 2.1 mg/dL (ref 1.7–2.4)

## 2019-10-31 LAB — PHOSPHORUS
Phosphorus: 7.1 mg/dL — ABNORMAL HIGH (ref 2.5–4.6)
Phosphorus: 5.6 mg/dL — ABNORMAL HIGH (ref 2.5–4.6)
Phosphorus: 4.8 mg/dL — ABNORMAL HIGH (ref 2.5–4.6)

## 2019-10-31 LAB — BRAIN NATRIURETIC PEPTIDE: B Natriuretic Peptide: 967.3 pg/mL — ABNORMAL HIGH (ref 0.0–100.0)

## 2019-10-31 LAB — ECHOCARDIOGRAM LIMITED
Height: 69 in
Weight: 2652.57 oz

## 2019-10-31 LAB — LACTIC ACID, PLASMA
Lactic Acid, Venous: 2.8 mmol/L (ref 0.5–1.9)
Lactic Acid, Venous: 2 mmol/L (ref 0.5–1.9)

## 2019-10-31 LAB — TROPONIN I (HIGH SENSITIVITY)
Troponin I (High Sensitivity): 700 ng/L (ref <18)
Troponin I (High Sensitivity): 756 ng/L (ref <18)

## 2019-10-31 LAB — VANCOMYCIN, RANDOM: Vancomycin Rm: 15

## 2019-10-31 LAB — HEPARIN LEVEL (UNFRACTIONATED): Heparin Unfractionated: >2.2 IU/mL — ABNORMAL HIGH (ref 0.30–0.70)

## 2019-10-31 LAB — CORTISOL: Cortisol, Plasma: >100 ug/dL

## 2019-10-31 LAB — FERRITIN: Ferritin: 12 ng/mL — ABNORMAL LOW (ref 24–336)

## 2019-10-31 MED ORDER — VITAL HIGH PROTEIN PO LIQD
1000.0000 mL | ORAL | Status: DC
  Administered 2019-10-31: 12:00:00 1000 mL

## 2019-10-31 MED ORDER — HEPARIN (PORCINE) 25000 UT/250ML-% IV SOLN
1150.0000 [IU]/h | INTRAVENOUS | Status: DC
  Administered 2019-10-31 – 2019-11-01 (×2): 700 [IU]/h via INTRAVENOUS
  Administered 2019-11-02: 18:00:00 850 [IU]/h via INTRAVENOUS
  Administered 2019-11-03: 18:00:00 1000 [IU]/h via INTRAVENOUS
  Administered 2019-11-04: 900 [IU]/h via INTRAVENOUS
  Administered 2019-11-06: 1000 [IU]/h via INTRAVENOUS
  Filled 2019-10-31 (×6): qty 250

## 2019-10-31 MED ORDER — DEXTROSE-NACL 5-0.45 % IV SOLN: INTRAVENOUS | Status: DC

## 2019-10-31 MED ORDER — INSULIN ASPART 100 UNIT/ML ~~LOC~~ SOLN
0.0000 [IU] | SUBCUTANEOUS | Status: DC
  Administered 2019-10-31: 12:00:00 3 [IU] via SUBCUTANEOUS
  Administered 2019-10-31: 11 [IU] via SUBCUTANEOUS
  Administered 2019-10-31 (×2): 8 [IU] via SUBCUTANEOUS
  Administered 2019-11-01: 20:00:00 6 [IU] via SUBCUTANEOUS
  Administered 2019-11-01: 3 [IU] via SUBCUTANEOUS
  Administered 2019-11-01 (×2): 8 [IU] via SUBCUTANEOUS
  Administered 2019-11-01: 17:00:00 5 [IU] via SUBCUTANEOUS
  Administered 2019-11-01: 04:00:00 11 [IU] via SUBCUTANEOUS
  Administered 2019-11-02: 23:00:00 2 [IU] via SUBCUTANEOUS
  Administered 2019-11-02: 20:00:00 5 [IU] via SUBCUTANEOUS
  Administered 2019-11-02 (×2): 8 [IU] via SUBCUTANEOUS
  Administered 2019-11-02 (×2): 5 [IU] via SUBCUTANEOUS
  Administered 2019-11-03 (×2): 3 [IU] via SUBCUTANEOUS
  Administered 2019-11-04 (×2): 2 [IU] via SUBCUTANEOUS
  Administered 2019-11-04: 3 [IU] via SUBCUTANEOUS
  Administered 2019-11-04: 2 [IU] via SUBCUTANEOUS
  Administered 2019-11-04 – 2019-11-05 (×4): 3 [IU] via SUBCUTANEOUS
  Administered 2019-11-05: 16:00:00 5 [IU] via SUBCUTANEOUS
  Administered 2019-11-05: 3 [IU] via SUBCUTANEOUS
  Administered 2019-11-05 – 2019-11-06 (×6): 8 [IU] via SUBCUTANEOUS
  Administered 2019-11-06 (×2): 5 [IU] via SUBCUTANEOUS
  Administered 2019-11-07 (×2): 8 [IU] via SUBCUTANEOUS
  Administered 2019-11-07 (×4): 5 [IU] via SUBCUTANEOUS
  Administered 2019-11-08 (×2): 11 [IU] via SUBCUTANEOUS
  Administered 2019-11-08: 8 [IU] via SUBCUTANEOUS

## 2019-10-31 MED ORDER — INSULIN REGULAR(HUMAN) IN NACL 100-0.9 UT/100ML-% IV SOLN
INTRAVENOUS | Status: DC
  Administered 2019-10-31: 03:00:00 5 [IU]/h via INTRAVENOUS
  Filled 2019-10-31: qty 100

## 2019-10-31 MED ORDER — AMIODARONE HCL IN DEXTROSE 360-4.14 MG/200ML-% IV SOLN
60.0000 mg/h | INTRAVENOUS | Status: AC
  Administered 2019-11-01: 01:00:00 60 mg/h via INTRAVENOUS
  Filled 2019-10-31: qty 200

## 2019-10-31 MED ORDER — PIPERACILLIN-TAZOBACTAM 3.375 G IVPB
3.3750 g | Freq: Three times a day (TID) | INTRAVENOUS | Status: DC
  Filled 2019-10-31: qty 50

## 2019-10-31 MED ORDER — FENTANYL 2500MCG IN NS 250ML (10MCG/ML) PREMIX INFUSION
25.0000 ug/h | INTRAVENOUS | Status: DC
  Administered 2019-10-31: 100 ug/h via INTRAVENOUS
  Administered 2019-11-02: 16:00:00 150 ug/h via INTRAVENOUS
  Administered 2019-11-03: 200 ug/h via INTRAVENOUS
  Filled 2019-10-31 (×4): qty 250

## 2019-10-31 MED ORDER — PROPOFOL 1000 MG/100ML IV EMUL
0.0000 ug/kg/min | INTRAVENOUS | Status: DC
  Administered 2019-10-31: 20 ug/kg/min via INTRAVENOUS
  Administered 2019-10-31: 17:00:00 15 ug/kg/min via INTRAVENOUS
  Administered 2019-11-01: 05:00:00 20 ug/kg/min via INTRAVENOUS
  Filled 2019-10-31 (×4): qty 100

## 2019-10-31 MED ORDER — VANCOMYCIN HCL 1500 MG/300ML IV SOLN
1500.0000 mg | Freq: Once | INTRAVENOUS | Status: AC
  Administered 2019-10-31: 01:00:00 1500 mg via INTRAVENOUS
  Filled 2019-10-31: qty 300

## 2019-10-31 MED ORDER — AMIODARONE LOAD VIA INFUSION
150.0000 mg | Freq: Once | INTRAVENOUS | Status: AC
  Administered 2019-10-31: 20:00:00 150 mg via INTRAVENOUS

## 2019-10-31 MED ORDER — AMIODARONE HCL IN DEXTROSE 360-4.14 MG/200ML-% IV SOLN
INTRAVENOUS | Status: AC
  Administered 2019-10-31: 20:00:00 60 mg/h via INTRAVENOUS
  Filled 2019-10-31: qty 200

## 2019-10-31 MED ORDER — DEXTROSE 50 % IV SOLN: 0.0000 mL | INTRAVENOUS | Status: DC | PRN

## 2019-10-31 MED ORDER — SODIUM CHLORIDE 0.9 % IV SOLN: INTRAVENOUS | Status: DC

## 2019-10-31 MED ORDER — MIDAZOLAM HCL 2 MG/2ML IJ SOLN
1.0000 mg | INTRAMUSCULAR | Status: DC | PRN
  Administered 2019-11-02 – 2019-11-05 (×8): 1 mg via INTRAVENOUS
  Filled 2019-10-31 (×8): qty 2

## 2019-10-31 MED ORDER — ASPIRIN 81 MG PO CHEW
81.0000 mg | CHEWABLE_TABLET | Freq: Every day | ORAL | Status: DC
  Administered 2019-11-01 – 2019-11-08 (×7): 81 mg
  Filled 2019-10-31 (×7): qty 1

## 2019-10-31 MED ORDER — HYDROCORTISONE NA SUCCINATE PF 100 MG IJ SOLR
50.0000 mg | Freq: Four times a day (QID) | INTRAMUSCULAR | Status: DC
  Administered 2019-10-31 – 2019-11-03 (×13): 50 mg via INTRAVENOUS
  Filled 2019-10-31 (×13): qty 2

## 2019-10-31 MED ORDER — AMIODARONE HCL IN DEXTROSE 360-4.14 MG/200ML-% IV SOLN
30.0000 mg/h | INTRAVENOUS | Status: DC
  Administered 2019-11-01 – 2019-11-02 (×3): 30 mg/h via INTRAVENOUS
  Filled 2019-10-31 (×3): qty 200

## 2019-10-31 MED ORDER — DOCUSATE SODIUM 50 MG/5ML PO LIQD
100.0000 mg | Freq: Two times a day (BID) | ORAL | Status: DC | PRN
  Administered 2019-11-08: 100 mg
  Filled 2019-10-31: qty 10

## 2019-10-31 MED ORDER — VANCOMYCIN HCL 750 MG/150ML IV SOLN
750.0000 mg | Freq: Once | INTRAVENOUS | Status: AC
  Administered 2019-11-01: 02:00:00 750 mg via INTRAVENOUS
  Filled 2019-10-31: qty 150

## 2019-10-31 MED ORDER — FENOFIBRATE 160 MG PO TABS
160.0000 mg | ORAL_TABLET | Freq: Every day | ORAL | Status: DC
  Administered 2019-10-31 – 2019-11-08 (×9): 160 mg
  Filled 2019-10-31 (×9): qty 1

## 2019-10-31 MED ORDER — ROSUVASTATIN CALCIUM 5 MG PO TABS
20.0000 mg | ORAL_TABLET | Freq: Every day | ORAL | Status: DC
  Administered 2019-10-31 – 2019-11-08 (×9): 20 mg
  Filled 2019-10-31 (×9): qty 4

## 2019-10-31 MED ORDER — PRO-STAT SUGAR FREE PO LIQD
30.0000 mL | Freq: Two times a day (BID) | ORAL | Status: DC
  Administered 2019-10-31 – 2019-11-04 (×8): 30 mL
  Filled 2019-10-31 (×8): qty 30

## 2019-10-31 MED ORDER — BISACODYL 10 MG RE SUPP: 10.0000 mg | Freq: Every day | RECTAL | Status: DC | PRN

## 2019-10-31 MED ORDER — CHLORHEXIDINE GLUCONATE 0.12% ORAL RINSE (MEDLINE KIT)
15.0000 mL | Freq: Two times a day (BID) | OROMUCOSAL | Status: DC
  Administered 2019-10-31 – 2019-11-08 (×18): 15 mL via OROMUCOSAL

## 2019-10-31 MED ORDER — ORAL CARE MOUTH RINSE
15.0000 mL | OROMUCOSAL | Status: DC
  Administered 2019-10-31 – 2019-11-08 (×84): 15 mL via OROMUCOSAL

## 2019-10-31 MED ORDER — SODIUM CHLORIDE 0.9 % IV SOLN
2.0000 g | INTRAVENOUS | Status: AC
  Administered 2019-10-31 – 2019-11-05 (×6): 2 g via INTRAVENOUS
  Filled 2019-10-31 (×6): qty 2

## 2019-10-31 MED ORDER — PANTOPRAZOLE SODIUM 40 MG PO PACK
40.0000 mg | PACK | ORAL | Status: DC
  Administered 2019-10-31 – 2019-11-07 (×8): 40 mg
  Filled 2019-10-31 (×9): qty 20

## 2019-10-31 MED ORDER — SODIUM CHLORIDE 0.9 % IV SOLN
1.0000 g | INTRAVENOUS | Status: DC
  Filled 2019-10-31: qty 1

## 2019-10-31 MED ORDER — FENTANYL BOLUS VIA INFUSION
25.0000 ug | INTRAVENOUS | Status: DC | PRN
  Administered 2019-11-01 – 2019-11-02 (×2): 25 ug via INTRAVENOUS
  Filled 2019-10-31: qty 25

## 2019-10-31 MED ORDER — VANCOMYCIN VARIABLE DOSE PER UNSTABLE RENAL FUNCTION (PHARMACIST DOSING): Status: DC

## 2019-10-31 NOTE — Progress Notes (Signed)
Pharmacy Antibiotic Note  Matthew Bates is a 73 y.o. male admitted on 10/29/2019 with pneumonia.  Pharmacy has been consulted for vancomycin dosing.  Random vanc level 24h after load is 15.  SCr seems to be leveling off (baseline ~1.5; since admission 2.12 > 3.02 > 3.3 > 3.33).  Plan: Will give additional vancomycin 750mg  IV x1 and continue to monitor.  Height: 5\' 9"  (175.3 cm) Weight: 165 lb 12.6 oz (75.2 kg) IBW/kg (Calculated) : 70.7  Temp (24hrs), Avg:97.9 F (36.6 C), Min:97.2 F (36.2 C), Max:99.5 F (37.5 C)  Recent Labs  Lab 11/01/2019 0537 10/09/2019 0819 10/15/2019 1714 10/31/19 0007 10/31/19 0238 10/31/19 0532 10/31/19 2306  WBC 11.7*  --   --  11.9*  --  11.6*  --   CREATININE 2.12*  --  3.02* 3.30*  --  3.33*  --   LATICACIDVEN 2.5* 2.0*  --  2.8* 2.0*  --   --   VANCORANDOM  --   --   --   --   --   --  15    Estimated Creatinine Clearance: 19.5 mL/min (A) (by C-G formula based on SCr of 3.33 mg/dL (H)).    Allergies  Allergen Reactions  . Metoclopramide Hcl Hypertension    Reglan=Increases HBP symptoms     Thank you for allowing pharmacy to be a part of this patient's care.  Wynona Neat, PharmD, BCPS  10/31/2019 11:50 PM

## 2019-10-31 NOTE — Progress Notes (Signed)
Called [facility]. Spoke to Dr Lucile Shutters about multiple issues.   Critical lactic acid of 2.8.   Potassium down to 5.4 from 7.1.  Also pts blood sugar 423.  New orders received.

## 2019-10-31 NOTE — Progress Notes (Signed)
[facility] Physician-Brief Progress Note Patient Name: Matthew Bates DOB: 12-Jun-1946 MRN: [id number]   Date of Service  10/31/2019  HPI/Events of Note  Notified of A fib with RVRR. HR persistently above 120, 140s with any movements. On 22 mic/min of levophed.   eICU Interventions  Amiodarone bolus followed by infusion ordered     Intervention Category Major Interventions: Arrhythmia - evaluation and management  Margaretmary Lombard 10/31/2019, 8:05 PM

## 2019-10-31 NOTE — Progress Notes (Signed)
Matthew Bates for heparin Indication: pulmonary embolus and DVT  Allergies  Allergen Reactions  . Metoclopramide Hcl Hypertension    Reglan=Increases HBP symptoms    Patient Measurements: Height: 5\' 9"  (175.3 cm) Weight: 165 lb 12.6 oz (75.2 kg) IBW/kg (Calculated) : 70.7  Vital Signs: Temp: 99.3 F (37.4 C) (12/27 2045) Temp Source: Bladder (12/27 2000) BP: 128/77 (12/27 2000) Pulse Rate: 97 (12/27 2045)  Labs: Recent Labs    10/26/2019 0537 10/26/2019 0537 11/02/2019 1714 10/31/19 0007 10/31/19 0143 10/31/19 0238 10/31/19 0507 10/31/19 0532 10/31/19 0836 10/31/19 2034  HGB 13.2   < >  --  11.0* 11.2*  --  10.5* 10.2*  --   --   HCT 44.1   < >  --  37.6* 33.0*  --  31.0* 34.3*  --   --   PLT 127*  --   --  133*  --   --   --  114*  --   --   APTT  --    < > 168* 40*  --   --   --  81*  --  74*  LABPROT  --   --   --  38.1*  --   --   --   --   --   --   INR  --   --   --  3.9*  --   --   --   --   --   --   HEPARINUNFRC  --   --  >2.20*  --   --   --   --   --  >2.20*  --   CREATININE 2.12*  --  3.02* 3.30*  --   --   --  3.33*  --   --   TROPONINIHS  --   --   --  700*  --  756*  --   --   --   --    < > = values in this interval not displayed.    Estimated Creatinine Clearance: 19.5 mL/min (A) (by C-G formula based on SCr of 3.33 mg/dL (H)).  Assessment: 73yo male had been discharged on 12/19 from [place] after stay for Covid-19, returned to [place] early 12/26 c/o worsening SOB >> plan to admit for acute respiratory failure >> then suffered cardiac arrest prior to transfer to inpatient unit >> transferred to [facility] ICU for further treatment  Diagnosed with PE prior to previous discharge and was started on apixaban. Per patient did not miss doses, with last 12/25 pm  Hep lvl > 2.2 - so will need to follow aPTT d/t apixaban affects  APTT came back at 74, therapeutic, on 700 units/hr. No s/sx of bleeding or infusion issues per nursing.    Goal of Therapy:  Heparin level 0.3-0.7 units/ml  APTT 66 - 102 s Monitor platelets by anticoagulation protocol: Yes   Plan:  Continue heparin 700 units/hr Daily HL, aptt, CBC, and monitor for s/sx of bleeding  Antonietta Jewel, PharmD, BCCCP Clinical Pharmacist  Phone: [phone]  Please check AMION for all [place] phone numbers After 10:00 PM, call [place] [phone] 10/31/2019 9:01 PM

## 2019-10-31 NOTE — Procedures (Signed)
Arterial Catheter Insertion Procedure Note RUAL BOLTZ [id number] 04-Jun-1946  Procedure: Insertion of Arterial Catheter  Indications: Blood pressure monitoring  Procedure Details Consent: Unable to obtain consent because of altered level of consciousness. Time Out: Verified patient identification, verified procedure, site/side was marked, verified correct patient position, special equipment/implants available, medications/allergies/relevent history reviewed, required imaging and test results available.  Performed  Maximum sterile technique was used including antiseptics, cap, gloves, gown, hand hygiene, mask and sheet. Skin prep: Chlorhexidine; local anesthetic administered 20 gauge catheter was inserted into right radial artery using the Seldinger technique. ULTRASOUND GUIDANCE USED: YES Evaluation Blood flow good; BP tracing good but very positional Complications: No apparent complications.   Otilio Carpen Kennede Lusk 10/31/2019

## 2019-10-31 NOTE — Progress Notes (Signed)
[facility] Physician-Brief Progress Note Patient Name: Matthew Bates DOB: [birth year] MRN: [id number]   Date of Service  10/31/2019  HPI/Events of Note  Blood sugar 423  eICU Interventions  Insulin infusion ordered        Alexarae Oliva U Arlie Posch 10/31/2019, 2:09 AM

## 2019-10-31 NOTE — Progress Notes (Signed)
  Echocardiogram 2D Echocardiogram has been performed.  Jennette Dubin 10/31/2019, 11:23 AM

## 2019-10-31 NOTE — Progress Notes (Addendum)
[place] for heparin Indication: pulmonary embolus and DVT  Allergies  Allergen Reactions  . Metoclopramide Hcl Hypertension    Reglan=Increases HBP symptoms    Patient Measurements: Height: 5\' 9"  (175.3 cm) Weight: 165 lb 12.6 oz (75.2 kg) IBW/kg (Calculated) : 70.7  Vital Signs: Temp: 97.2 F (36.2 C) (12/27 0930) Temp Source: Core (12/27 0808) BP: 126/56 (12/27 0900) Pulse Rate: 73 (12/27 0930)  Labs: Recent Labs    10/08/2019 0537 11/04/2019 0537 10/17/2019 1714 10/31/19 0007 10/31/19 0143 10/31/19 0238 10/31/19 0507 10/31/19 0532 10/31/19 0836  HGB 13.2   < >  --  11.0* 11.2*  --  10.5* 10.2*  --   HCT 44.1   < >  --  37.6* 33.0*  --  31.0* 34.3*  --   PLT 127*  --   --  133*  --   --   --  114*  --   APTT  --   --  168* 40*  --   --   --  81*  --   LABPROT  --   --   --  38.1*  --   --   --   --   --   INR  --   --   --  3.9*  --   --   --   --   --   HEPARINUNFRC  --   --  >2.20*  --   --   --   --   --  >2.20*  CREATININE 2.12*  --  3.02* 3.30*  --   --   --  3.33*  --   TROPONINIHS  --   --   --  700*  --  756*  --   --   --    < > = values in this interval not displayed.    Estimated Creatinine Clearance: 19.5 mL/min (A) (by C-G formula based on SCr of 3.33 mg/dL (H)).  Assessment: 73yo male had been discharged on 12/19 from [place] after stay for Covid-19, returned to [place] early 12/26 c/o worsening SOB >> plan to admit for acute respiratory failure >> then suffered cardiac arrest prior to transfer to inpatient unit >> transferred to [facility] ICU for further treatment  Diagnosed with PE prior to previous discharge and was started on apixaban. Per patient did not miss doses, with last 12/25 pm  Hep lvl > 2.2 - so will need to follow aPTT d/t apixaban affects  APTT this am 81 CBC stable  Goal of Therapy:  Heparin level 0.3-0.7 units/ml  APTT 66 - 102 s Monitor platelets by anticoagulation protocol: Yes   Plan:  Continue  heparin 700 units/hr Will confirm aptt at 1600 Daily Hep lvl aptt cbc  Barth Kirks, PharmD, BCPS, BCCCP Clinical Pharmacist [phone]  Please check AMION for all [place] numbers  10/31/2019 10:08 AM

## 2019-10-31 NOTE — Progress Notes (Signed)
NAME:  Matthew Bates, MRN:  [id number], DOB:  Jan 12, 1946, LOS: 1 ADMISSION DATE:  10/08/2019, CONSULTATION DATE:  10/31/19 CHIEF COMPLAINT:  Cardiac arrest  Brief History   73 yo male was discharged from [facility] on 12/19 after treatment for COVID 19 pneumonia (received remdesivir and steroids), and DVT with presumed PE and sent home on eliquis.  Developed progressive dyspnea with hypoxia and returned to ER on 12/26, and intubated by EMS prior to arrival.  Developed bradycardia with PEA, then VT requiring defibrillation and started on amiodarone.  Past Medical History  CAD Cataract CKD DM GERD GOUT Hepatitis High triglycerides HTN Obesity  Significant Hospital Events   12/05 to 12/19 Admit to [facility] 12/26 Admit to [facility] after PEA/VT cardiac arrest with hypoxia and VDRF  Consults:    Procedures:  ETT 12/26 >>  Rt femoral CVL 12/26 >>  Rt radial a line 12/27 >>   Significant Diagnostic Tests:  CT head 12/27 >> no acute findings Echo 12/27 >>   Micro Data:  Blood 12/26 >> Sputum 12/26 >>   Antimicrobials:  Vancomycin 12/26 >> Cefepime 12/26 >>   Interim history/subjective:  On vent, pressors, insulin gtt, HCO3 gtt.  Objective   Blood pressure (!) 126/56, pulse 73, temperature (!) 97.2 F (36.2 C), resp. rate (!) 8, height 5\' 9"  (1.753 m), weight 75.2 kg, SpO2 99 %.    Vent Mode: PRVC FiO2 (%):  [50 %-100 %] 50 % Set Rate:  [16 bmp-26 bmp] 26 bmp Vt Set:  [420 mL-560 mL] 560 mL PEEP:  [5 cmH20-8 cmH20] 5 cmH20 Plateau Pressure:  [17 cmH20-35 cmH20] 32 cmH20   Intake/Output Summary (Last 24 hours) at 10/31/2019 0947 Last data filed at 10/31/2019 0900 Gross per 24 hour  Intake 3534.02 ml  Output 700 ml  Net 2834.02 ml   Filed Weights   10/14/2019 0517 10/27/2019 2255 10/31/19 0318  Weight: 85.7 kg 75.2 kg 75.2 kg    Examination:  General - sedated Eyes - pupils reactive ENT - ETT in place Cardiac - regular rate/rhythm, no murmur Chest - b/l crackles Abdomen -  soft, non tender, + bowel sounds Extremities - 1+ edema Skin - no rashes Neuro - RASS -3  CXR (reviewed by me) - b/l ASD  Resolved Hospital Problem list     Assessment & Plan:   Acute hypoxic,hypercapnic respiratory after recent treatment for COVID 19 pneumonia. - concern for HCAP and/or acute PE - full vent support - f/u CXR - day 2 of ABx pending culture results - COVID 19 positive test from 10/09/19 >> will need to discuss with infection control when he can has airborne isolation d/c'ed  Shock. - likely from sepsis and cardiogenic - pressors to keep MAP > 65 - recently on steroids for tx of COVID PNA; check cortisol and add solu cortef  PEA/VT arrest >> likely from acute hypoxic respiratory failure. Acute on chronic systolic CHF. Hx of CAD s/p CABG, HTN, Hypertriglyceridemia. - f/u Echo - continue ASA, crestor, fenofibrate  B/l lower leg DVT's. - dx'ed 10/16/19 - continue heparin gtt - renal function precludes CT angiogram chest at this time  Acute metabolic encephalopathy from hypoxia, sepsis, renal failure. - noted to be able to follow commands after cardiac arrest 12/26 - RASS goal -1 to -2  AKI from hypoxia/ischemia ATN. - baseline renal fx 1.68 from 10/22/19 - continue support care - f/u BMET, ABG  DM type II with poor control. - insulin gtt  Pressure ulcers. -  Stage 2 Rt sacrum, present prior to this admission  Best practice:  Diet: tube feeds DVT prophylaxis: Heparin drip GI prophylaxis: protonix Mobility: bed rest Code Status: Full code per discussion with wife Disposition: ICU  Labs    CMP Latest Ref Rng & Units 10/31/2019 10/31/2019 10/31/2019  Glucose 70 - 99 mg/dL 326(H) - -  BUN 8 - 23 mg/dL 68(H) - -  Creatinine 0.61 - 1.24 mg/dL 3.33(H) - -  Sodium 135 - 145 mmol/L 141 139 139  Potassium 3.5 - 5.1 mmol/L 4.4 6.0(H) 4.9  Chloride 98 - 111 mmol/L 106 - -  CO2 22 - 32 mmol/L 22 - -  Calcium 8.9 - 10.3 mg/dL 7.7(L) - -  Total Protein  6.5 - 8.1 g/dL - - -  Total Bilirubin 0.3 - 1.2 mg/dL - - -  Alkaline Phos 38 - 126 U/L - - -  AST 15 - 41 U/L - - -  ALT 0 - 44 U/L - - -    CBC Latest Ref Rng & Units 10/31/2019 10/31/2019 10/31/2019  WBC 4.0 - 10.5 K/uL 11.6(H) - -  Hemoglobin 13.0 - 17.0 g/dL 10.2(L) 10.5(L) 11.2(L)  Hematocrit 39.0 - 52.0 % 34.3(L) 31.0(L) 33.0(L)  Platelets 150 - 400 K/uL 114(L) - -    ABG    Component Value Date/Time   PHART 7.350 10/31/2019 0507   PCO2ART 46.9 10/31/2019 0507   PO2ART 73.0 (L) 10/31/2019 0507   HCO3 26.1 10/31/2019 0507   TCO2 28 10/31/2019 0507   ACIDBASEDEF 3.0 (H) 10/31/2019 0143   O2SAT 94.0 10/31/2019 0507    CBG (last 3)  Recent Labs    10/31/19 0715 10/31/19 0839 10/31/19 0954  GLUCAP 256* 219* 209*    CC time 39 minutes  Chesley Mires, MD [facility] Pulmonary/Critical Care 10/31/2019, 10:09 AM

## 2019-10-31 NOTE — Progress Notes (Signed)
RT attempted to collect sputum culture. No sputum in return after suction. RT left sputum trap in-line.

## 2019-10-31 NOTE — Progress Notes (Signed)
PCCM progress:  Spoke with patient's wife Karanveer Lemberg and updated her with patient's arrival to [facility] and discussed plan of care.   Relayed that patient's prognosis is very guarded at this time and that further CPR may not be effective.  She confirms full code, but will start thinking about making him DNR, she feels too overwhelmed tonight to make that decision tonight.

## 2019-10-31 NOTE — Progress Notes (Signed)
Spoke to pt's wife.  Updated about current status and treatment plan.  Explained that differential for acute worsening is likely combination of acute PE with recent DVT and HCAP.  Chesley Mires, MD [facility] Pulmonary/Critical Care 10/31/2019, 4:27 PM

## 2019-10-31 NOTE — Progress Notes (Addendum)
ANTICOAGULATION and ANTIBIOTIC CONSULT NOTE - Initial Consult  Pharmacy Consult for heparin, vancomycin, and cefepime Indication: known DVT. suspected PE, possible ACS w/ cardiac arrest, and pneumonia  Allergies  Allergen Reactions  . Metoclopramide Hcl Hypertension    Reglan=Increases HBP symptoms    Patient Measurements: Height: 5\' 9"  (175.3 cm) Weight: 165 lb 12.6 oz (75.2 kg) IBW/kg (Calculated) : 70.7  Vital Signs: Temp: 97.5 F (36.4 C) (12/27 0015) Temp Source: Bladder (12/26 2315) BP: 105/57 (12/27 0015) Pulse Rate: 85 (12/27 0015)  Labs: Recent Labs    10/06/2019 0537 10/12/2019 1714 10/13/2019 2306 10/31/19 0007  HGB 13.2  --  11.9* 11.0*  HCT 44.1  --  35.0* 37.6*  PLT 127*  --   --  133*  APTT  --  168*  --  40*  LABPROT  --   --   --  38.1*  INR  --   --   --  3.9*  HEPARINUNFRC  --  >2.20*  --   --   CREATININE 2.12* 3.02*  --   --     Estimated Creatinine Clearance: 21.5 mL/min (A) (by C-G formula based on SCr of 3.02 mg/dL (H)).   Medical History: Past Medical History:  Diagnosis Date  . Anemia   . CAD (coronary artery disease)   . Carotid artery occlusion   . Cataract   . Chronic kidney disease   . COVID-19   . DM (diabetes mellitus) ([place])   . GERD (gastroesophageal reflux disease)   . Gout   . Hepatitis    as a child  . High triglycerides   . HTN (hypertension)   . Obesity      Assessment: 73yo male had been discharged on 12/19 from [facility] after stay for Covid-19, returned to [place] early 12/26 c/o worsening SOB >> plan to admit for acute respiratory failure >> then suffered cardiac arrest prior to transfer to inpatient unit >> transferred to [facility] ICU for further treatment; to continue heparin started at [facility] and start IV ABX for presumed superimposed PNA (vs worsening PE).  Goal of Therapy:  Heparin level 0.3-0.7 units/ml  Vanc AUC 400-550 Monitor platelets by anticoagulation protocol: Yes   Plan:  Was supposed to resume heparin prior to  transfer to [facility] but RN states that pt arrived without heparin running; start heparin gtt at 700 units/hr and monitor heparin levels and CBC. Vancomycin 1500mg  IV x1; will hold off on further doses for now given AKI and monitor SCr prior to redosing. Cefepime 2g IV Q24H.  Wynona Neat, PharmD, BCPS  10/31/2019,12:59 AM

## 2019-11-01 ENCOUNTER — Inpatient Hospital Stay (HOSPITAL_COMMUNITY): Payer: Medicare HMO

## 2019-11-01 DIAGNOSIS — J1289 Other viral pneumonia: Secondary | ICD-10-CM

## 2019-11-01 LAB — POCT I-STAT 7, (LYTES, BLD GAS, ICA,H+H)
Acid-base deficit: 3 mmol/L — ABNORMAL HIGH (ref 0.0–2.0)
Bicarbonate: 23.2 mmol/L (ref 20.0–28.0)
Calcium, Ion: 1.05 mmol/L — ABNORMAL LOW (ref 1.15–1.40)
HCT: 31 % — ABNORMAL LOW (ref 39.0–52.0)
Hemoglobin: 10.5 g/dL — ABNORMAL LOW (ref 13.0–17.0)
O2 Saturation: 95 %
Patient temperature: 36.7
Potassium: 4.1 mmol/L (ref 3.5–5.1)
Sodium: 141 mmol/L (ref 135–145)
TCO2: 24 mmol/L (ref 22–32)
pCO2 arterial: 42.7 mmHg (ref 32.0–48.0)
pH, Arterial: 7.342 — ABNORMAL LOW (ref 7.350–7.450)
pO2, Arterial: 78 mmHg — ABNORMAL LOW (ref 83.0–108.0)

## 2019-11-01 LAB — GLUCOSE, CAPILLARY
Glucose-Capillary: 343 mg/dL — ABNORMAL HIGH (ref 70–99)
Glucose-Capillary: 277 mg/dL — ABNORMAL HIGH (ref 70–99)
Glucose-Capillary: 260 mg/dL — ABNORMAL HIGH (ref 70–99)
Glucose-Capillary: 214 mg/dL — ABNORMAL HIGH (ref 70–99)
Glucose-Capillary: 200 mg/dL — ABNORMAL HIGH (ref 70–99)
Glucose-Capillary: 196 mg/dL — ABNORMAL HIGH (ref 70–99)

## 2019-11-01 LAB — PHOSPHORUS
Phosphorus: 5 mg/dL — ABNORMAL HIGH (ref 2.5–4.6)
Phosphorus: 5.1 mg/dL — ABNORMAL HIGH (ref 2.5–4.6)
Phosphorus: 5.2 mg/dL — ABNORMAL HIGH (ref 2.5–4.6)

## 2019-11-01 LAB — CBC
HCT: 33.5 % — ABNORMAL LOW (ref 39.0–52.0)
Hemoglobin: 10.2 g/dL — ABNORMAL LOW (ref 13.0–17.0)
MCH: 26 pg (ref 26.0–34.0)
MCHC: 30.4 g/dL (ref 30.0–36.0)
MCV: 85.2 fL (ref 80.0–100.0)
Platelets: 125 10*3/uL — ABNORMAL LOW (ref 150–400)
RBC: 3.93 MIL/uL — ABNORMAL LOW (ref 4.22–5.81)
RDW: 19.2 % — ABNORMAL HIGH (ref 11.5–15.5)
WBC: 13.8 10*3/uL — ABNORMAL HIGH (ref 4.0–10.5)
nRBC: 0 % (ref 0.0–0.2)

## 2019-11-01 LAB — URINE CULTURE: Culture: NO GROWTH

## 2019-11-01 LAB — HEPARIN LEVEL (UNFRACTIONATED): Heparin Unfractionated: >2.2 IU/mL — ABNORMAL HIGH (ref 0.30–0.70)

## 2019-11-01 LAB — COMPREHENSIVE METABOLIC PANEL
ALT: 41 U/L (ref 0–44)
AST: 43 U/L — ABNORMAL HIGH (ref 15–41)
Albumin: 1.7 g/dL — ABNORMAL LOW (ref 3.5–5.0)
Alkaline Phosphatase: 84 U/L (ref 38–126)
Anion gap: 13 (ref 5–15)
BUN: 78 mg/dL — ABNORMAL HIGH (ref 8–23)
CO2: 21 mmol/L — ABNORMAL LOW (ref 22–32)
Calcium: 7.2 mg/dL — ABNORMAL LOW (ref 8.9–10.3)
Chloride: 106 mmol/L (ref 98–111)
Creatinine, Ser: 3.36 mg/dL — ABNORMAL HIGH (ref 0.61–1.24)
GFR calc Af Amer: 20 mL/min — ABNORMAL LOW (ref >60)
GFR calc non Af Amer: 17 mL/min — ABNORMAL LOW (ref >60)
Glucose, Bld: 382 mg/dL — ABNORMAL HIGH (ref 70–99)
Potassium: 4.1 mmol/L (ref 3.5–5.1)
Sodium: 140 mmol/L (ref 135–145)
Total Bilirubin: 0.8 mg/dL (ref 0.3–1.2)
Total Protein: 5.2 g/dL — ABNORMAL LOW (ref 6.5–8.1)

## 2019-11-01 LAB — MAGNESIUM
Magnesium: 2.2 mg/dL (ref 1.7–2.4)
Magnesium: 2.2 mg/dL (ref 1.7–2.4)
Magnesium: 2.2 mg/dL (ref 1.7–2.4)

## 2019-11-01 LAB — LACTIC ACID, PLASMA
Lactic Acid, Venous: 1.7 mmol/L (ref 0.5–1.9)
Lactic Acid, Venous: 1.5 mmol/L (ref 0.5–1.9)

## 2019-11-01 LAB — PROCALCITONIN: Procalcitonin: 1.77 ng/mL

## 2019-11-01 LAB — TRIGLYCERIDES: Triglycerides: 290 mg/dL — ABNORMAL HIGH (ref <150)

## 2019-11-01 LAB — APTT: aPTT: 71 seconds — ABNORMAL HIGH (ref 24–36)

## 2019-11-01 MED ORDER — INSULIN ASPART 100 UNIT/ML ~~LOC~~ SOLN
3.0000 [IU] | SUBCUTANEOUS | Status: DC
  Administered 2019-11-01 – 2019-11-02 (×10): 3 [IU] via SUBCUTANEOUS

## 2019-11-01 MED ORDER — INSULIN DETEMIR 100 UNIT/ML ~~LOC~~ SOLN
8.0000 [IU] | Freq: Two times a day (BID) | SUBCUTANEOUS | Status: DC
  Administered 2019-11-01 – 2019-11-02 (×4): 8 [IU] via SUBCUTANEOUS
  Filled 2019-11-01 (×6): qty 0.08

## 2019-11-01 MED ORDER — VANCOMYCIN HCL 750 MG/150ML IV SOLN
750.0000 mg | INTRAVENOUS | Status: DC
  Filled 2019-11-01: qty 150

## 2019-11-01 MED ORDER — VITAL 1.5 CAL PO LIQD
1000.0000 mL | ORAL | Status: DC
  Administered 2019-11-01 – 2019-11-07 (×5): 1000 mL
  Filled 2019-11-01 (×10): qty 1000

## 2019-11-01 NOTE — Consult Note (Signed)
   [facility] CM Inpatient Consult   11/01/2019  Matthew Bates January 06, 1946 [id number]   Patient screened for extreme high risk score for unplanned readmission score and for less than 7 days readmission [place] with [facility] Review of patient's medical record to check for barriers and needs, progress notes reveals patient is readmitted with worsening respiratory currently on ventilator, with a HX of COVID-19 positive last week at [facility] and discharged on 10/23/2019 and readmitted 10/05/2019.  Primary Care Provider is [place] is: [facility]  Plan:  Follow up with inpatient [facility] team for progress and post hospital follow up needs. Continue to follow progress and disposition to assess for post hospital care management needs.    Please place a [facility] Care Management consult as appropriate and for questions contact:   Natividad Brood, RN BSN [place] [facility] Liaison  [phone] business mobile phone Toll free office [phone]  Fax number: [phone] [email or web address]

## 2019-11-01 NOTE — Progress Notes (Signed)
Initial Nutrition Assessment  RD working remotely.  DOCUMENTATION CODES:   Not applicable  INTERVENTION:   Tube feeding: - Vital 1.5 @ 50 ml/hr (1200 ml/day) via OG tube - Pro-stat 30 ml BID  Tube feeding regimen provides 2000 kcal, 111 grams of protein, and 917 ml of H2O (99% of kcal needs, 100% of protein needs).  NUTRITION DIAGNOSIS:   Inadequate oral intake related to inability to eat as evidenced by NPO status.  GOAL:   Patient will meet greater than or equal to 90% of their needs  MONITOR:   Vent status, Labs, Weight trends, TF tolerance, Skin, I & O's  REASON FOR ASSESSMENT:   Ventilator, Consult Enteral/tube feeding initiation and management  ASSESSMENT:   73 year old male who presented to the ED on 12/26 with SOB and COVID-19 positive. Pt had been discharged 1 week prior after an admission for COVID-19 pneumonia. PMH of CHF, CAD, DM, HTN, GERD. Pt required intubation in the ED. During transport on 12/26, pt experienced PEA arrest.   RD consulted for tube feeding initiation and management. MD ordered Adult ICU Tube Feeding Protocol. RD to adjust to better meet pt's needs.  OG tube in place with TF currently infusing. Per RN, pt tolerating TF without issue.  Reviewed weight history in chart. Pt with a 4.8 kg weight loss since 10/09/19. This is a 5.6% weight loss in less than 1 month which is significant for timeframe. Pt is at risk for acute malnutrition and may already meet criteria. RD unable to confirm without NFPE.  Per RN edema assessment, pt with mild pitting edema to BUE. This may be masking additional weight loss.  Patient is currently intubated on ventilator support MV: 15.7 L/min Temp (24hrs), Avg:98.4 F (36.9 C), Min:97.2 F (36.2 C), Max:99.5 F (37.5 C)  Drips: Propofol: off this AM Fentanyl: 7.5 ml/hr Amiodarone: 16.7 ml/hr Heparin: 7 ml/hr D5 in 1/2NS: 10 ml/hr LR: 50 ml/hr Levophed: 2.8 ml/hr  Medications reviewed and include:  fenofibrate, solu-cortef, SSI q 4 hours, protonix, IV abx  Labs reviewed: BUN 78, creatinine 3.36, ionized calcium 1.05, phosphorus 5.0, TG 290 CBG's: 178-343 x 24 hours  UOP: 1350 ml x 24 hours I/O's: +6.0 L since admit  NUTRITION - FOCUSED PHYSICAL EXAM:  Unable to complete at this time. RD working remotely.  Diet Order:   Diet Order            Diet NPO time specified  Diet effective now              EDUCATION NEEDS:   No education needs have been identified at this time  Skin:  Skin Assessment: Skin Integrity Issues: DTI: sacrum Stage II: sacrum  Last BM:  10/06/2019 medium type 4  Height:   Ht Readings from Last 1 Encounters:  10/20/2019 5\' 9"  (1.753 m)    Weight:   Wt Readings from Last 1 Encounters:  11/01/19 80.9 kg    Ideal Body Weight:  72.7 kg  BMI:  Body mass index is 26.34 kg/m.  Estimated Nutritional Needs:   Kcal:  2020  Protein:  105-120 grams  Fluid:  >/= 2.0 L    Gaynell Face, MS, RD, LDN Inpatient Clinical Dietitian Pager: [phone] Weekend/After Hours: [phone]

## 2019-11-01 NOTE — Progress Notes (Signed)
Inpatient Diabetes Program Recommendations  AACE/ADA: New Consensus Statement on Inpatient Glycemic Control (2015)  Target Ranges:  Prepandial:   less than 140 mg/dL      Peak postprandial:   less than 180 mg/dL (1-2 hours)      Critically ill patients:  140 - 180 mg/dL   Lab Results  Component Value Date   GLUCAP 277 (H) 11/01/2019   HGBA1C 7.2 (H) 10/09/2019    Review of Glycemic Control Results for Matthew Bates, Matthew Bates (MRN [id number]) as of 11/01/2019 10:20  Ref. Range 10/31/2019 16:20 10/31/2019 19:43 10/31/2019 23:06 11/01/2019 03:16 11/01/2019 08:17  Glucose-Capillary Latest Ref Range: 70 - 99 mg/dL 284 (H) 292 (H) 329 (H) 343 (H) 277 (H)   Diabetes history: DM2 Outpatient Diabetes medications: Tradjenta 5 mg qd + Metformin 1 gm bid Current orders for Inpatient glycemic control: Novolog moderate correction q 4 hrs.  Inpatient Diabetes Program Recommendations:   While on steroids and oral diabetes meds held: -Levemir 8 units bid -Novolog 3 units q 4 hrs tube feed coverage (hold if stopped or held for any reason)  Thank you, Bethena Roys E. Whitnie Deleon, RN, MSN, CDE  Diabetes Coordinator Inpatient Glycemic Control Team Team Pager [phone] (8am-5pm) 11/01/2019 10:22 AM

## 2019-11-01 NOTE — Progress Notes (Signed)
[place] for heparin Indication: pulmonary embolus and DVT   Patient Measurements: Height: 5\' 9"  (175.3 cm) Weight: 178 lb 5.6 oz (80.9 kg) IBW/kg (Calculated) : 70.7  Vital Signs: Temp: 98.2 F (36.8 C) (12/28 0845) Temp Source: Bladder (12/28 0400) BP: 138/101 (12/28 0800) Pulse Rate: 105 (12/28 0845)  Labs: Recent Labs    10/28/2019 1714 10/31/19 0007 10/31/19 0238 10/31/19 0532 10/31/19 0836 10/31/19 2034 11/01/19 0316 11/01/19 0355  HGB  --  11.0*  --  10.2*  --   --  10.2* 10.5*  HCT  --  37.6*  --  34.3*  --   --  33.5* 31.0*  PLT  --  133*  --  114*  --   --  125*  --   APTT 168* 40*  --  81*  --  74* 71*  --   LABPROT  --  38.1*  --   --   --   --   --   --   INR  --  3.9*  --   --   --   --   --   --   HEPARINUNFRC >2.20*  --   --   --  >2.20*  --  >2.20*  --   CREATININE 3.02* 3.30*  --  3.33*  --   --  3.36*  --   TROPONINIHS  --  700* 756*  --   --   --   --   --      Assessment: 73yo male had been discharged on 12/19 from [facility] after stay for Covid-19, returned to [facility] early 12/26 c/o worsening SOB >> plan to admit for acute respiratory failure >> then suffered cardiac arrest prior to transfer to inpatient unit >> transferred to [facility] ICU for further treatment  Diagnosed with PE prior to previous discharge and was started on apixaban, last dose 12/25.  The patient's HL is still elevated, a sign that apixaban is still in his system and not fully washed out yet. APTT is therapeutic on current rate of heparin. CBC stable.  Goal of Therapy:  Heparin level 0.3-0.7 units/ml  APTT 66 - 102 seconds Monitor platelets by anticoagulation protocol: Yes   Plan:  Continue heparin 700 units/hr Daily HL, aptt, CBC, monitor for s/sx of bleeding    Harvel Quale 11/01/2019 9:10 AM

## 2019-11-01 NOTE — Progress Notes (Signed)
NAME:  Matthew Bates, MRN:  [id number], DOB:  07-31-1946, LOS: 2 ADMISSION DATE:  10/24/2019, CONSULTATION DATE:  10/31/19 CHIEF COMPLAINT:  Cardiac arrest  Brief History   73 yo male was discharged from [facility] on 12/19 after treatment for COVID 19 pneumonia (received remdesivir and steroids), and DVT with presumed PE and sent home on eliquis.  Developed progressive dyspnea with hypoxia and returned to ER on 12/26, and intubated by EMS prior to arrival.  Developed bradycardia with PEA, then VT requiring defibrillation and started on amiodarone.  Past Medical History  CAD Cataract CKD DM GERD GOUT Hepatitis High triglycerides HTN Obesity  Significant Hospital Events   12/05 to 12/19 Admit to [facility] 12/26 Admit to [facility] after PEA/VT cardiac arrest with hypoxia and VDRF  Consults:    Procedures:  ETT 12/26 >>  Rt femoral CVL 12/26 >>  Rt radial a line 12/27 >>   Significant Diagnostic Tests:  12/11 B/l LE venous doppler: Right: Findings consistent with acute deep vein thrombosis involving the right posterior tibial veins, and right peroneal veins. Left: Findings consistent with acute deep vein thrombosis involving the left soleal veins, and left gastrocnemius veins. No cystic structure found in the popliteal fossa.  CT head 12/27 >> no acute findings Echo 12/27 >> LVEF 45-50%, RV with reduced systolic function and enlargement, LA dilated  Micro Data:  Blood 12/26 >>ngtd Blood 12/27: ngtd Urine 12/27: neg Sputum 12/26 >> not obtained  Antimicrobials:  Vancomycin 12/26 >> Cefepime 12/26 >>  Zosyn 12/26  Interim history/subjective:  12/28:unable to tolerate sbt this am with inadequate TV. Sedation being weaned. Arouses and following commands somewhat.  tmax 99.1. BS elevated to high 200's off insulin infusion now. Remains on amio gtt with hr in low 100's. 120's when stimulated, in afib 12/27: On vent, pressors, insulin gtt, HCO3 gtt.  Objective   Blood pressure 102/66, pulse  (!) 105, temperature 99.1 F (37.3 C), resp. rate (!) 26, height 5\' 9"  (1.753 m), weight 80.9 kg, SpO2 92 %.    Vent Mode: PRVC FiO2 (%):  [40 %] 40 % Set Rate:  [26 bmp] 26 bmp Vt Set:  [560 mL] 560 mL PEEP:  [5 cmH20] 5 cmH20 Plateau Pressure:  [21 cmH20-35 cmH20] 31 cmH20   Intake/Output Summary (Last 24 hours) at 11/01/2019 1146 Last data filed at 11/01/2019 1100 Gross per 24 hour  Intake 4192.4 ml  Output 1250 ml  Net 2942.4 ml   Filed Weights   10/25/2019 2255 10/31/19 0318 11/01/19 0204  Weight: 75.2 kg 75.2 kg 80.9 kg    Examination:  General - awake and following some commands this am.  Eyes - pupils reactive, sclera anicteric ENT - ETT in place Cardiac - regular rate/rhythm, no murmur Chest - b/l crackles Abdomen - soft, non tender, + bowel sounds Extremities - 1+ edema Skin - no rashes Neuro -arousable and follows simple commands.   CXR (reviewed by me) - b/l pneumonia interval worsening   Resolved Hospital Problem list     Assessment & Plan:   Acute hypoxic,hypercapnic respiratory after recent treatment for COVID 19 pneumonia. Suspected PE - concern for HCAP and/or acute PE -new echo c/w RH with suppressive EF - f/u CXR with interval worsening of pneumonia - day 3 of ABx pending culture results - COVID 19 positive test from 10/09/19 >> will need to discuss with infection control when he can has airborne isolation d/c'ed -Continue mechanical ventilation per ARDS protocol -Target TVol 6-8cc/kgIBW -Target Plateau  Pressure < 30cm H20 -Target driving pressure less than 15 cm of water -Target PaO2 at least 55-65: titrate PEEP/FiO2 per protocol -Ventilator associated pneumonia prevention protocol  Shock. - likely from sepsis and cardiogenic - pressors to keep MAP > 65 - recently on steroids for tx of COVID PNA; cortisol >100, but unknown timing of recent steroid use. Will cont stress steroids for now as still on pressors.  -wbc up somewhat today ? 2/2  steroids, pct was 0.28 at last check.  -recheck lactate, pct  PEA/VT arrest >> likely from acute hypoxic respiratory failure. Acute on chronic systolic CHF. Hx of CAD s/p CABG, HTN, Hypertriglyceridemia. afib with rvr -new RHstrain noted suspect 2/2 pe as above - continue ASA, crestor, fenofibrate -on amio gtt -on heparin gtt Prolonged qt:  -recheck ekg today -avoid prolonging agents as able  B/l lower leg DVT's. - dx'ed 10/16/19 - continue heparin gtt - renal function precludes CT angiogram chest at this time (however echo supportive of acute PE) Thrombocytopenia:  -stable to improved at 125 -cont heparin gtt.   Acute metabolic encephalopathy from hypoxia, sepsis, renal failure. - noted to be able to follow commands after cardiac arrest 12/26 - RASS goal 0 to -1  AKI from hypoxia/ischemia ATN. - baseline renal fx 1.6 from 10/22/19 -indices relatively stable with adequate uop  DM type II with hyperglycemia -off insulin gtt on ssi -will change orders based on diabetic coordinator recs. -adding levemir bid and scheduled novolog for tube feed coverage  Pressure ulcers. - Stage 2 Rt sacrum, present prior to this admission  Best practice:  Diet: tube feeds DVT prophylaxis: Heparin drip GI prophylaxis: protonix Mobility: bed rest Code Status: Full code Disposition: ICU Family communication: attempted to update wife via phone. No answer, left message. Will try later today.   Labs    CMP Latest Ref Rng & Units 11/01/2019 11/01/2019 10/31/2019  Glucose 70 - 99 mg/dL - 382(H) 326(H)  BUN 8 - 23 mg/dL - 78(H) 68(H)  Creatinine 0.61 - 1.24 mg/dL - 3.36(H) 3.33(H)  Sodium 135 - 145 mmol/L 141 140 141  Potassium 3.5 - 5.1 mmol/L 4.1 4.1 4.4  Chloride 98 - 111 mmol/L - 106 106  CO2 22 - 32 mmol/L - 21(L) 22  Calcium 8.9 - 10.3 mg/dL - 7.2(L) 7.7(L)  Total Protein 6.5 - 8.1 g/dL - 5.2(L) -  Total Bilirubin 0.3 - 1.2 mg/dL - 0.8 -  Alkaline Phos 38 - 126 U/L - 84 -  AST  15 - 41 U/L - 43(H) -  ALT 0 - 44 U/L - 41 -    CBC Latest Ref Rng & Units 11/01/2019 11/01/2019 10/31/2019  WBC 4.0 - 10.5 K/uL - 13.8(H) 11.6(H)  Hemoglobin 13.0 - 17.0 g/dL 10.5(L) 10.2(L) 10.2(L)  Hematocrit 39.0 - 52.0 % 31.0(L) 33.5(L) 34.3(L)  Platelets 150 - 400 K/uL - 125(L) 114(L)    ABG    Component Value Date/Time   PHART 7.342 (L) 11/01/2019 0355   PCO2ART 42.7 11/01/2019 0355   PO2ART 78.0 (L) 11/01/2019 0355   HCO3 23.2 11/01/2019 0355   TCO2 24 11/01/2019 0355   ACIDBASEDEF 3.0 (H) 11/01/2019 0355   O2SAT 95.0 11/01/2019 0355    CBG (last 3)  Recent Labs    11/01/19 0316 11/01/19 0817 11/01/19 1114  GLUCAP 343* 277* 260*    Critical care time: The patient is critically ill with multiple organ systems failure and requires high complexity decision making for assessment and support, frequent evaluation and  titration of therapies, application of advanced monitoring technologies and extensive interpretation of multiple databases.  Critical care time 50 mins. This represents my time independent of the NPs time taking care of the pt. This is excluding procedures.    Audria Nine DO [place] [facility]monary and Critical Care 11/01/2019, 11:46 AM

## 2019-11-02 ENCOUNTER — Inpatient Hospital Stay (HOSPITAL_COMMUNITY): Payer: Medicare HMO

## 2019-11-02 LAB — GLUCOSE, CAPILLARY
Glucose-Capillary: 209 mg/dL — ABNORMAL HIGH (ref 70–99)
Glucose-Capillary: 284 mg/dL — ABNORMAL HIGH (ref 70–99)
Glucose-Capillary: 203 mg/dL — ABNORMAL HIGH (ref 70–99)
Glucose-Capillary: 299 mg/dL — ABNORMAL HIGH (ref 70–99)
Glucose-Capillary: 234 mg/dL — ABNORMAL HIGH (ref 70–99)
Glucose-Capillary: 136 mg/dL — ABNORMAL HIGH (ref 70–99)

## 2019-11-02 LAB — POCT I-STAT 7, (LYTES, BLD GAS, ICA,H+H)
Acid-base deficit: 1 mmol/L (ref 0.0–2.0)
Bicarbonate: 26.1 mmol/L (ref 20.0–28.0)
Calcium, Ion: 1.08 mmol/L — ABNORMAL LOW (ref 1.15–1.40)
HCT: 27 % — ABNORMAL LOW (ref 39.0–52.0)
Hemoglobin: 9.2 g/dL — ABNORMAL LOW (ref 13.0–17.0)
O2 Saturation: 95 %
Patient temperature: 37.1
Potassium: 4.3 mmol/L (ref 3.5–5.1)
Sodium: 143 mmol/L (ref 135–145)
TCO2: 28 mmol/L (ref 22–32)
pCO2 arterial: 52.6 mmHg — ABNORMAL HIGH (ref 32.0–48.0)
pH, Arterial: 7.304 — ABNORMAL LOW (ref 7.350–7.450)
pO2, Arterial: 86 mmHg (ref 83.0–108.0)

## 2019-11-02 LAB — BASIC METABOLIC PANEL
Anion gap: 12 (ref 5–15)
BUN: 97 mg/dL — ABNORMAL HIGH (ref 8–23)
CO2: 23 mmol/L (ref 22–32)
Calcium: 7.3 mg/dL — ABNORMAL LOW (ref 8.9–10.3)
Chloride: 113 mmol/L — ABNORMAL HIGH (ref 98–111)
Creatinine, Ser: 3.54 mg/dL — ABNORMAL HIGH (ref 0.61–1.24)
GFR calc Af Amer: 19 mL/min — ABNORMAL LOW (ref >60)
GFR calc non Af Amer: 16 mL/min — ABNORMAL LOW (ref >60)
Glucose, Bld: 290 mg/dL — ABNORMAL HIGH (ref 70–99)
Potassium: 4.5 mmol/L (ref 3.5–5.1)
Sodium: 148 mmol/L — ABNORMAL HIGH (ref 135–145)

## 2019-11-02 LAB — MAGNESIUM
Magnesium: 2.3 mg/dL (ref 1.7–2.4)
Magnesium: 2.3 mg/dL (ref 1.7–2.4)

## 2019-11-02 LAB — COMPREHENSIVE METABOLIC PANEL
ALT: 38 U/L (ref 0–44)
AST: 36 U/L (ref 15–41)
Albumin: 1.8 g/dL — ABNORMAL LOW (ref 3.5–5.0)
Alkaline Phosphatase: 89 U/L (ref 38–126)
Anion gap: 13 (ref 5–15)
BUN: 96 mg/dL — ABNORMAL HIGH (ref 8–23)
CO2: 24 mmol/L (ref 22–32)
Calcium: 7.3 mg/dL — ABNORMAL LOW (ref 8.9–10.3)
Chloride: 109 mmol/L (ref 98–111)
Creatinine, Ser: 3.72 mg/dL — ABNORMAL HIGH (ref 0.61–1.24)
GFR calc Af Amer: 17 mL/min — ABNORMAL LOW (ref >60)
GFR calc non Af Amer: 15 mL/min — ABNORMAL LOW (ref >60)
Glucose, Bld: 240 mg/dL — ABNORMAL HIGH (ref 70–99)
Potassium: 4.4 mmol/L (ref 3.5–5.1)
Sodium: 146 mmol/L — ABNORMAL HIGH (ref 135–145)
Total Bilirubin: 0.8 mg/dL (ref 0.3–1.2)
Total Protein: 5.3 g/dL — ABNORMAL LOW (ref 6.5–8.1)

## 2019-11-02 LAB — CBC
HCT: 31.1 % — ABNORMAL LOW (ref 39.0–52.0)
Hemoglobin: 9.2 g/dL — ABNORMAL LOW (ref 13.0–17.0)
MCH: 25.8 pg — ABNORMAL LOW (ref 26.0–34.0)
MCHC: 29.6 g/dL — ABNORMAL LOW (ref 30.0–36.0)
MCV: 87.1 fL (ref 80.0–100.0)
Platelets: 86 10*3/uL — ABNORMAL LOW (ref 150–400)
RBC: 3.54 MIL/uL — ABNORMAL LOW (ref 4.22–5.81)
RDW: 19.8 % — ABNORMAL HIGH (ref 11.5–15.5)
WBC: 9.4 10*3/uL (ref 4.0–10.5)
nRBC: 0.4 % — ABNORMAL HIGH (ref 0.0–0.2)

## 2019-11-02 LAB — TRIGLYCERIDES: Triglycerides: 138 mg/dL (ref <150)

## 2019-11-02 LAB — APTT
aPTT: 52 seconds — ABNORMAL HIGH (ref 24–36)
aPTT: 67 seconds — ABNORMAL HIGH (ref 24–36)

## 2019-11-02 LAB — PHOSPHORUS: Phosphorus: 5.2 mg/dL — ABNORMAL HIGH (ref 2.5–4.6)

## 2019-11-02 LAB — PROCALCITONIN: Procalcitonin: 1.3 ng/mL

## 2019-11-02 LAB — HEPARIN LEVEL (UNFRACTIONATED): Heparin Unfractionated: >2.2 IU/mL — ABNORMAL HIGH (ref 0.30–0.70)

## 2019-11-02 MED FILL — Medication: Qty: 1 | Status: AC

## 2019-11-02 NOTE — Progress Notes (Signed)
Assisted tele visit to patient with son.  Wanell Lorenzi [place], RN   

## 2019-11-02 NOTE — Progress Notes (Signed)
Notified Dr. Ruthann Cancer that pt. Had over a 40 beat run of v-tach. MD ordered to discontinue amiodarone and get a STAT BMP & MG.   No further orders were given. Will continue to monitor patient.

## 2019-11-02 NOTE — Progress Notes (Addendum)
NAME:  Matthew Bates, MRN:  [id number], DOB:  1946-06-27, LOS: 3 ADMISSION DATE:  10/10/2019, CONSULTATION DATE:  10/31/19 CHIEF COMPLAINT:  Cardiac arrest  Brief History   73 yo male was discharged from [facility] on 12/19 after treatment for COVID 19 pneumonia (received remdesivir and steroids), and DVT with presumed PE and sent home on eliquis.  Developed progressive dyspnea with hypoxia and returned to ER on 12/26, and intubated by EMS prior to arrival.  Developed bradycardia with PEA, then VT requiring defibrillation and started on amiodarone.  Past Medical History  CAD Cataract CKD DM GERD GOUT Hepatitis High triglycerides HTN Obesity  Significant Hospital Events   12/05 to 12/19 Admit to [facility] 12/26 Admit to [facility] after PEA/VT cardiac arrest with hypoxia and VDRF  Consults:    Procedures:  ETT 12/26 >>  Rt femoral CVL 12/26 >>  Rt radial a line 12/27 >>   Significant Diagnostic Tests:  12/11 B/l LE venous doppler: Right: Findings consistent with acute deep vein thrombosis involving the right posterior tibial veins, and right peroneal veins. Left: Findings consistent with acute deep vein thrombosis involving the left soleal veins, and left gastrocnemius veins. No cystic structure found in the popliteal fossa.  CT head 12/27 >> no acute findings Echo 12/27 >> LVEF 45-50%, RV with reduced systolic function and enlargement, LA dilated  Micro Data:  Blood 12/26 >>ngtd Blood 12/27: ngtd Urine 12/27: neg Sputum 12/26 >> not obtained  Antimicrobials:  Vancomycin 12/26 >>12/29 Cefepime 12/26 >>  Zosyn 12/26  Interim history/subjective:  12/29: increased fio2 to 60% overnight 2/2 desat to 85. Awakens and weakly follows commands on minimal sedation. Remains on pressors this am as well.  12/28:unable to tolerate sbt this am with inadequate TV. Sedation being weaned. Arouses and following commands somewhat.  tmax 99.1. BS elevated to high 200's off insulin infusion now. Remains on  amio gtt with hr in low 100's. 120's when stimulated, in afib 12/27: On vent, pressors, insulin gtt, HCO3 gtt.  Objective   Blood pressure (!) 146/65, pulse 86, temperature 99 F (37.2 C), resp. rate (!) 26, height 5\' 9"  (1.753 m), weight 80.5 kg, SpO2 93 %.    Vent Mode: PRVC FiO2 (%):  [50 %-60 %] 60 % Set Rate:  [26 bmp] 26 bmp Vt Set:  [560 mL] 560 mL PEEP:  [5 cmH20] 5 cmH20 Plateau Pressure:  [28 cmH20-33 cmH20] 31 cmH20   Intake/Output Summary (Last 24 hours) at 11/02/2019 1043 Last data filed at 11/02/2019 1000 Gross per 24 hour  Intake 3542.25 ml  Output 1425 ml  Net 2117.25 ml   Filed Weights   10/31/19 0318 11/01/19 0204 11/02/19 0342  Weight: 75.2 kg 80.9 kg 80.5 kg    Examination:  General - awake and following some commands this am but weakly   Eyes - pupils reactive, sclera anicteric ENT - ETT in place, mmdry but pink Cardiac - regular rate/rhythm, no murmur Chest - b/l crackles Abdomen - soft, non tender, + bowel sounds Extremities - 1+ edema Skin - no rashes Neuro -arousable and follows simple commands.   CXR (reviewed by me) - b/l pneumonia stable ett is at head of clavicles but measures 3cm from carina  Resolved Hospital Problem list     Assessment & Plan:   Acute  On chronic (4L since last d/c) hypoxic,hypercapnic respiratory after recent treatment for COVID 19 pneumonia. Suspected PE - concern for HCAP and/or acute PE -new echo c/w RH with suppressive EF -  f/u CXR stable bl pna - day 4 of ABX cx negative will complete 7 days empiric cefepime due to cont need for pressors and severe illness (mrsa pcr negative so will stop vanc) - COVID 19 positive test from 10/09/19  -Continue mechanical ventilation per ARDS protocol -Target TVol 6-8cc/kgIBW -Target Plateau Pressure < 30cm H20 -Target driving pressure less than 15 cm of water -Target PaO2 at least 55-65: titrate PEEP/FiO2 per protocol -Ventilator associated pneumonia prevention  protocol  Shock. - likely from sepsis and cardiogenic - pressors to keep MAP > 65 - recently on steroids for tx of COVID PNA; cortisol >100, but unknown timing of recent steroid use. Will cont stress steroids for now as still on pressors.  -wbc improved as well.  -pct improving -lactate improving as well.   PEA/VT arrest >> likely from acute hypoxic respiratory failure. Acute on chronic systolic CHF. Hx of CAD s/p CABG, HTN, Hypertriglyceridemia. Persistent afib with rvr -new RHstrain noted suspect 2/2 pe as above - continue ASA, crestor, fenofibrate -on amio gtt -on heparin gtt Prolonged qt:  -remains prolonged -avoid prolonging agents as able requiring amio for now.  -recheck tomorrow 12/30  B/l lower leg DVT's. - dx'ed 10/16/19 - continue heparin gtt - renal function precludes CT angiogram chest at this time (however echo supportive of acute PE) Thrombocytopenia:  -114->125-->86 -cont heparin gtt for now but had exposure within past 30 days (with multiple other reasons including sepsis for thrombocytopenia). Intermediate probability for hitt. Will send heparin ab but hold on change of heparin for now.   Acute metabolic encephalopathy from hypoxia, sepsis, renal failure. 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 - baseline renal fx 1.6 from 10/22/19 -indices up some today to 3.7,  -Stopping vanc -uop remains adequate -cont LR for now.   DM type II with hyperglycemia -increasing insulin again today.   Pressure ulcers. - Stage 2 Rt sacrum, present prior to this admission  Best practice:  Diet: tube feeds DVT prophylaxis: Heparin drip GI prophylaxis: protonix Mobility: bed rest Code Status: Full code Disposition: ICU Family communication: updated wife via phone this am.    Labs    CMP Latest Ref Rng & Units 11/02/2019 11/02/2019 11/01/2019  Glucose 70 - 99 mg/dL - 240(H) -   BUN 8 - 23 mg/dL - 96(H) -  Creatinine 0.61 - 1.24 mg/dL - 3.72(H) -  Sodium 135 - 145 mmol/L 143 146(H) 141  Potassium 3.5 - 5.1 mmol/L 4.3 4.4 4.1  Chloride 98 - 111 mmol/L - 109 -  CO2 22 - 32 mmol/L - 24 -  Calcium 8.9 - 10.3 mg/dL - 7.3(L) -  Total Protein 6.5 - 8.1 g/dL - 5.3(L) -  Total Bilirubin 0.3 - 1.2 mg/dL - 0.8 -  Alkaline Phos 38 - 126 U/L - 89 -  AST 15 - 41 U/L - 36 -  ALT 0 - 44 U/L - 38 -    CBC Latest Ref Rng & Units 11/02/2019 11/02/2019 11/01/2019  WBC 4.0 - 10.5 K/uL - 9.4 -  Hemoglobin 13.0 - 17.0 g/dL 9.2(L) 9.2(L) 10.5(L)  Hematocrit 39.0 - 52.0 % 27.0(L) 31.1(L) 31.0(L)  Platelets 150 - 400 K/uL - 86(L) -    ABG    Component Value Date/Time   PHART 7.304 (L) 11/02/2019 0338   PCO2ART 52.6 (H) 11/02/2019 0338   PO2ART 86.0 11/02/2019 0338   HCO3 26.1  11/02/2019 0338   TCO2 28 11/02/2019 0338   ACIDBASEDEF 1.0 11/02/2019 0338   O2SAT 95.0 11/02/2019 0338    CBG (last 3)  Recent Labs    11/01/19 2317 11/02/19 0322 11/02/19 0732  GLUCAP 196* 209* 284*    Critical care time: The patient is critically ill with multiple organ systems failure and requires high complexity decision making for assessment and support, frequent evaluation and titration of therapies, application of advanced monitoring technologies and extensive interpretation of multiple databases.  Critical care time 43 mins. This represents my time independent of the NPs time taking care of the pt. This is excluding procedures.    Audria Nine DO [facility] 11/02/2019, 10:43 AM

## 2019-11-02 NOTE — Progress Notes (Signed)
Plateau Pressure 32-37 cm on a tidal volume of 560 which is 8cc/kgIBW. Decreased tidal volume to 6cc/kgIBW at 420 with Plateau Pressures 26-29 cm. Increased RR to attempt to keep the same minute ventilation.. Also peep was increased to 10cm due a decrease in Sp02 levels. Driving pressure before changes 28cm after changes 17cm. Will obtain arterial blood gas.

## 2019-11-02 NOTE — Progress Notes (Signed)
Pt vomited tube feeds. Tube feeds stopped and OG tube hooked up to LIWS. 700 mls of tube feed/tan colored sections suctioned out of pt stomach immediately and still draining.   [facility] called and made aware.

## 2019-11-02 NOTE — Progress Notes (Signed)
ANTICOAGULATION CONSULT NOTE  Pharmacy Consult for heparin Indication: pulmonary embolus and DVT   Patient Measurements: Height: 5\' 9"  (175.3 cm) Weight: 177 lb 7.5 oz (80.5 kg) IBW/kg (Calculated) : 70.7  Vital Signs: Temp: 98.6 F (37 C) (12/29 0500) Temp Source: Bladder (12/29 0400) BP: 147/67 (12/29 0400) Pulse Rate: 72 (12/29 0500)  Labs: Recent Labs    10/18/2019 1714 10/31/19 0007 10/31/19 0238 10/31/19 0532 10/31/19 0836 10/31/19 2034 11/01/19 0316 11/01/19 0355 11/02/19 0335 11/02/19 0338  HGB   < > 11.0*  --  10.2*  --   --  10.2* 10.5* 9.2* 9.2*  HCT   < > 37.6*  --  34.3*  --   --  33.5* 31.0* 31.1* 27.0*  PLT  --  133*  --  114*  --   --  125*  --  86*  --   APTT   < > 40*  --  81*  --  74* 71*  --  52*  --   LABPROT  --  38.1*  --   --   --   --   --   --   --   --   INR  --  3.9*  --   --   --   --   --   --   --   --   HEPARINUNFRC  --   --   --   --  >2.20*  --  >2.20*  --  >2.20*  --   CREATININE  --  3.30*  --  3.33*  --   --  3.36*  --  3.72*  --   TROPONINIHS  --  700* 756*  --   --   --   --   --   --   --    < > = values in this interval not displayed.     Assessment: 72yo male had been discharged on 12/19 from [place] after stay for Covid-19, returned to [place] early 12/26 c/o worsening SOB >> plan to admit for acute respiratory failure >> then suffered cardiac arrest prior to transfer to inpatient unit >> transferred to [facility] ICU for further treatment  Diagnosed with PE prior to previous discharge and was started on apixaban, last dose 12/25.  Heparin level remains >2.2 (apixaban still affecting). PTT down to 52 sec (subtherapetuic) on gtt at 700 units/hr..  Goal of Therapy:  Heparin level 0.3-0.7 units/ml  APTT 66 - 102 seconds Monitor platelets by anticoagulation protocol: Yes   Plan:  Will increase heparin to 850 units/hr F/u 8hr PTT  Sherlon Handing, PharmD, BCPS Please see amion for complete clinical pharmacist phone list 11/02/2019 5:16  AM

## 2019-11-03 ENCOUNTER — Inpatient Hospital Stay (HOSPITAL_COMMUNITY): Payer: Medicare HMO

## 2019-11-03 LAB — CBC
HCT: 30.7 % — ABNORMAL LOW (ref 39.0–52.0)
Hemoglobin: 9 g/dL — ABNORMAL LOW (ref 13.0–17.0)
MCH: 25.5 pg — ABNORMAL LOW (ref 26.0–34.0)
MCHC: 29.3 g/dL — ABNORMAL LOW (ref 30.0–36.0)
MCV: 87 fL (ref 80.0–100.0)
Platelets: 82 10*3/uL — ABNORMAL LOW (ref 150–400)
RBC: 3.53 MIL/uL — ABNORMAL LOW (ref 4.22–5.81)
RDW: 20 % — ABNORMAL HIGH (ref 11.5–15.5)
WBC: 11 10*3/uL — ABNORMAL HIGH (ref 4.0–10.5)
nRBC: 0.3 % — ABNORMAL HIGH (ref 0.0–0.2)

## 2019-11-03 LAB — POCT I-STAT 7, (LYTES, BLD GAS, ICA,H+H)
Bicarbonate: 28.7 mmol/L — ABNORMAL HIGH (ref 20.0–28.0)
Calcium, Ion: 1.08 mmol/L — ABNORMAL LOW (ref 1.15–1.40)
HCT: 26 % — ABNORMAL LOW (ref 39.0–52.0)
Hemoglobin: 8.8 g/dL — ABNORMAL LOW (ref 13.0–17.0)
O2 Saturation: 95 %
Patient temperature: 98.6
Potassium: 4.7 mmol/L (ref 3.5–5.1)
Sodium: 150 mmol/L — ABNORMAL HIGH (ref 135–145)
TCO2: 31 mmol/L (ref 22–32)
pCO2 arterial: 67.5 mmHg (ref 32.0–48.0)
pH, Arterial: 7.236 — ABNORMAL LOW (ref 7.350–7.450)
pO2, Arterial: 92 mmHg (ref 83.0–108.0)

## 2019-11-03 LAB — COMPREHENSIVE METABOLIC PANEL
ALT: 34 U/L (ref 0–44)
AST: 37 U/L (ref 15–41)
Albumin: 1.9 g/dL — ABNORMAL LOW (ref 3.5–5.0)
Alkaline Phosphatase: 96 U/L (ref 38–126)
Anion gap: 9 (ref 5–15)
BUN: 97 mg/dL — ABNORMAL HIGH (ref 8–23)
CO2: 27 mmol/L (ref 22–32)
Calcium: 7.4 mg/dL — ABNORMAL LOW (ref 8.9–10.3)
Chloride: 114 mmol/L — ABNORMAL HIGH (ref 98–111)
Creatinine, Ser: 3.46 mg/dL — ABNORMAL HIGH (ref 0.61–1.24)
GFR calc Af Amer: 19 mL/min — ABNORMAL LOW (ref >60)
GFR calc non Af Amer: 16 mL/min — ABNORMAL LOW (ref >60)
Glucose, Bld: 146 mg/dL — ABNORMAL HIGH (ref 70–99)
Potassium: 4.7 mmol/L (ref 3.5–5.1)
Sodium: 150 mmol/L — ABNORMAL HIGH (ref 135–145)
Total Bilirubin: 0.6 mg/dL (ref 0.3–1.2)
Total Protein: 5.8 g/dL — ABNORMAL LOW (ref 6.5–8.1)

## 2019-11-03 LAB — APTT
aPTT: 65 seconds — ABNORMAL HIGH (ref 24–36)
aPTT: 95 seconds — ABNORMAL HIGH (ref 24–36)

## 2019-11-03 LAB — GLUCOSE, CAPILLARY
Glucose-Capillary: 130 mg/dL — ABNORMAL HIGH (ref 70–99)
Glucose-Capillary: 111 mg/dL — ABNORMAL HIGH (ref 70–99)
Glucose-Capillary: 91 mg/dL (ref 70–99)
Glucose-Capillary: 137 mg/dL — ABNORMAL HIGH (ref 70–99)
Glucose-Capillary: 152 mg/dL — ABNORMAL HIGH (ref 70–99)
Glucose-Capillary: 153 mg/dL — ABNORMAL HIGH (ref 70–99)

## 2019-11-03 LAB — PHOSPHORUS: Phosphorus: 4.8 mg/dL — ABNORMAL HIGH (ref 2.5–4.6)

## 2019-11-03 LAB — TRIGLYCERIDES: Triglycerides: 110 mg/dL (ref <150)

## 2019-11-03 LAB — HEPARIN LEVEL (UNFRACTIONATED): Heparin Unfractionated: >2.2 IU/mL — ABNORMAL HIGH (ref 0.30–0.70)

## 2019-11-03 LAB — PROCALCITONIN: Procalcitonin: 0.86 ng/mL

## 2019-11-03 LAB — HEPARIN INDUCED PLATELET AB (HIT ANTIBODY): Heparin Induced Plt Ab: 0.109 OD (ref 0.000–0.400)

## 2019-11-03 LAB — MAGNESIUM: Magnesium: 2.4 mg/dL (ref 1.7–2.4)

## 2019-11-03 MED ORDER — FREE WATER: 200.0000 mL | Freq: Three times a day (TID) | Status: DC

## 2019-11-03 MED ORDER — FENTANYL CITRATE (PF) 100 MCG/2ML IJ SOLN
50.0000 ug | INTRAMUSCULAR | Status: DC | PRN
  Administered 2019-11-04 – 2019-11-06 (×5): 100 ug via INTRAVENOUS
  Administered 2019-11-06 (×2): 50 ug via INTRAVENOUS
  Filled 2019-11-03: qty 2

## 2019-11-03 MED ORDER — FUROSEMIDE 10 MG/ML IJ SOLN
40.0000 mg | Freq: Once | INTRAMUSCULAR | Status: AC
  Administered 2019-11-03: 12:00:00 40 mg via INTRAVENOUS
  Filled 2019-11-03: qty 4

## 2019-11-03 MED ORDER — FREE WATER
300.0000 mL | Freq: Four times a day (QID) | Status: DC
  Administered 2019-11-03 – 2019-11-05 (×7): 300 mL

## 2019-11-03 MED ORDER — DEXMEDETOMIDINE HCL IN NACL 400 MCG/100ML IV SOLN
0.4000 ug/kg/h | INTRAVENOUS | Status: DC
  Administered 2019-11-03: 18:00:00 0.8 ug/kg/h via INTRAVENOUS
  Administered 2019-11-03: 12:00:00 0.4 ug/kg/h via INTRAVENOUS
  Administered 2019-11-04: 1.2 ug/kg/h via INTRAVENOUS
  Filled 2019-11-03 (×4): qty 100

## 2019-11-03 MED ORDER — LACTATED RINGERS IV BOLUS
500.0000 mL | Freq: Once | INTRAVENOUS | Status: AC
  Administered 2019-11-03: 18:00:00 500 mL via INTRAVENOUS

## 2019-11-03 MED ORDER — NOREPINEPHRINE 16 MG/250ML-% IV SOLN
0.0000 ug/min | INTRAVENOUS | Status: DC
  Administered 2019-11-03: 17:00:00 2 ug/min via INTRAVENOUS

## 2019-11-03 MED ORDER — CALCIUM GLUCONATE-NACL 1-0.675 GM/50ML-% IV SOLN
1.0000 g | Freq: Once | INTRAVENOUS | Status: AC
  Administered 2019-11-03: 12:00:00 1000 mg via INTRAVENOUS
  Filled 2019-11-03: qty 50

## 2019-11-03 MED ORDER — INSULIN DETEMIR 100 UNIT/ML ~~LOC~~ SOLN
4.0000 [IU] | Freq: Two times a day (BID) | SUBCUTANEOUS | Status: DC
  Administered 2019-11-03 – 2019-11-08 (×10): 4 [IU] via SUBCUTANEOUS
  Filled 2019-11-03 (×12): qty 0.04

## 2019-11-03 MED ORDER — DEXAMETHASONE SODIUM PHOSPHATE 10 MG/ML IJ SOLN
6.0000 mg | INTRAMUSCULAR | Status: DC
  Administered 2019-11-03 – 2019-11-05 (×3): 6 mg via INTRAVENOUS
  Filled 2019-11-03 (×2): qty 1
  Filled 2019-11-03: qty 0.6

## 2019-11-03 NOTE — Progress Notes (Signed)
ANTICOAGULATION CONSULT NOTE  Pharmacy Consult for heparin Indication: pulmonary embolus and DVT   Patient Measurements: Height: 5\' 9"  (175.3 cm) Weight: 182 lb 5.1 oz (82.7 kg) IBW/kg (Calculated) : 70.7  Heparin Dosing Weight: 82.7 kg  Vital Signs: Temp: 98.8 F (37.1 C) (12/30 1615) Temp Source: Oral (12/30 1223) BP: 119/50 (12/30 1600) Pulse Rate: 84 (12/30 1615)  Labs: Recent Labs    11/01/19 0316 11/02/19 0335 11/02/19 0335 11/02/19 0338 11/02/19 1315 11/02/19 1749 11/03/19 0208 11/03/19 0258 11/03/19 1600  HGB 10.2* 9.2*   < > 9.2*  --   --  9.0* 8.8*  --   HCT 33.5* 31.1*  --  27.0*  --   --  30.7* 26.0*  --   PLT 125* 86*  --   --   --   --  82*  --   --   APTT 71* 52*  --   --  67*  --  65*  --  95*  HEPARINUNFRC >2.20* >2.20*  --   --   --   --  >2.20*  --   --   CREATININE 3.36* 3.72*  --   --   --  3.54* 3.46*  --   --    < > = values in this interval not displayed.    Assessment: 73 yr old male had been discharged on 12/19 from [place] after stay for COVID-19, and returned to [facility] ED early 12/26 with worsening SOB >> plan to admit for acute respiratory failure >> then, suffered cardiac arrest prior to transfer to inpatient unit >> transferred to [facility] ICU for further treatment.  Pt was diagnosed with PE prior to previous discharge and was started on apixaban, last dose 10/29/19. Earlier today, heparin level was >2.2 units/ml, with subtherapeutic aPTT (65 sec), indicted that apixaban is still affecting heparin level. HIT antibody was sent yesterday, due to drop in platelets - test results still pending.  aPTT drawn ~8 hrs after heparin infusion was increased to 1000 units/hr is 95 sec, which is within the goal range for this patient. H/H 8.8/26.0, stable. Per RN, no issues with IV or bleeding observed.  Goal of Therapy:  Heparin level 0.3-0.7 units/ml  aPTT 66 - 102 seconds Monitor platelets by anticoagulation protocol: Yes   Plan:  Continue heparin  infusion at 1000 units/hr Check confirmatory aPTT in 8 hrs Monitor daily heparin level, aPTT, CBC Monitor for signs/symptoms of bleeding  Gillermina Hu, PharmD, BCPS, [facility] Clinical Pharmacist 11/03/2019 4:59 PM

## 2019-11-03 NOTE — Progress Notes (Signed)
eLink Physician-Brief Progress Note Patient Name: Matthew Bates DOB: 04/07/1946 MRN: [id number]   Date of Service  11/03/2019  HPI/Events of Note  PT with 700 ml residual from enteral nutrition, he is not a candidate for Reglan due to history of prolonged QT  eICU Interventions  Hold enteral nutrition overnight and re-address tomorrow morning.        [name] [name] 11/03/2019, 12:47 AM

## 2019-11-03 NOTE — Progress Notes (Signed)
ANTICOAGULATION CONSULT NOTE  Pharmacy Consult for heparin Indication: pulmonary embolus and DVT   Patient Measurements: Height: 5\' 9"  (175.3 cm) Weight: 182 lb 5.1 oz (82.7 kg) IBW/kg (Calculated) : 70.7  Vital Signs: Temp: 97.7 F (36.5 C) (12/30 0730) Temp Source: Bladder (12/30 0400) BP: 118/60 (12/30 0741) Pulse Rate: 79 (12/30 0741)  Labs: Recent Labs    11/01/19 0316 11/02/19 0335 11/02/19 0338 11/02/19 1315 11/02/19 1749 11/03/19 0208 11/03/19 0258  HGB 10.2* 9.2* 9.2*  --   --  9.0* 8.8*  HCT 33.5* 31.1* 27.0*  --   --  30.7* 26.0*  PLT 125* 86*  --   --   --  82*  --   APTT 71* 52*  --  67*  --  65*  --   HEPARINUNFRC >2.20* >2.20*  --   --   --  >2.20*  --   CREATININE 3.36* 3.72*  --   --  3.54* 3.46*  --      Assessment: 73yo male had been discharged on 12/19 from [place] after stay for Covid-19, returned to [facility] early 12/26 c/o worsening SOB >> plan to admit for acute respiratory failure >> then suffered cardiac arrest prior to transfer to inpatient unit >> transferred to [facility] ICU for further treatment  Diagnosed with PE prior to previous discharge and was started on apixaban, last dose 12/25.  Heparin level remains >2.2 (apixaban still affecting). APTT is subtherapeutic today. HIT Antibody was sent yesterday due to drop in platelets - this is still pending.   Goal of Therapy:  Heparin level 0.3-0.7 units/ml  APTT 66 - 102 seconds Monitor platelets by anticoagulation protocol: Yes   Plan:  -Increase heparin to 1000 units/hr -Daily HL, CBC, aPTT -Check aPTT this afternoon   Harvel Quale  11/03/2019 7:47 AM

## 2019-11-03 NOTE — Progress Notes (Signed)
PM rounds  stabe on 45% fio2 Got lasix earlier today But then hypotensive - now back on levophed 61mcg  Plan  500cc LR bolus    SIGNATURE    Dr. Brand Males, M.D., F.C.C.P,  Pulmonary and Critical Care Medicine Staff Physician, [place] Director - Interstitial Lung Disease  Program  Pulmonary [place] at [place], [place], [address]  Pager: [phone], If no answer or between  15:00h - 7:00h: call [phone] Telephone: [phone]  6:05 PM 11/03/2019

## 2019-11-03 NOTE — Progress Notes (Signed)
Assisted tele visit to patient with wife.  Matthew Roussin Anderson, RN   

## 2019-11-03 NOTE — Progress Notes (Signed)
NAME:  Matthew Bates, MRN:  [id number], DOB:  08/08/1946, LOS: 4 ADMISSION DATE:  10/16/2019, CONSULTATION DATE:  10/31/19 CHIEF COMPLAINT:  Cardiac arrest  Brief History   73 yo male was discharged from [facility] on 12/19 after treatment for COVID 19 pneumonia (received remdesivir and steroids), and DVT with presumed PE and sent home on eliquis.  Developed progressive dyspnea with hypoxia and returned to ER on 12/26, and intubated by EMS prior to arrival.  Developed bradycardia with PEA, then VT requiring defibrillation and started on amiodarone.  Past Medical History  CAD Cataract CKD DM GERD GOUT Hepatitis High triglycerides HTN Obesity  Significant Hospital Events   12/05 to 12/19 Admit to [facility] 12/26 Admit to [facility] after PEA/VT cardiac arrest with hypoxia and VDRF 12/29 multiple runs of VT, amio stopped  Consults:    Procedures:  ETT 12/26 >>  Rt femoral CVL 12/26 >>  Rt radial a line 12/27 >>   Significant Diagnostic Tests:  12/11 B/l LE venous doppler: Right: Findings consistent with acute deep vein thrombosis involving the right posterior tibial veins, and right peroneal veins. Left: Findings consistent with acute deep vein thrombosis involving the left soleal veins, and left gastrocnemius veins. No cystic structure found in the popliteal fossa.  CT head 12/27 >> no acute findings Echo 12/27 >> LVEF 45-50%, RV with reduced systolic function and enlargement, LA dilated  Micro Data:  Blood 12/26 >>ngtd Blood 12/27: ngtd Urine 12/27: neg Sputum 12/26 >> not obtained  Antimicrobials:  Vancomycin 12/26 >>12/29 Cefepime 12/26 >>  Zosyn 12/26  Interim history/subjective:  Had vomiting overnight with 316ml residuals TF.  TF stopped. Awakens and follows commands this AM.  Objective   Blood pressure 115/62, pulse 81, temperature 97.9 F (36.6 C), resp. rate (!) 32, height 5\' 9"  (1.753 m), weight 82.7 kg, SpO2 94 %.    Vent Mode: PRVC FiO2 (%):  [45 %-60 %] 45 % Set  Rate:  [26 bmp-32 bmp] 32 bmp Vt Set:  [420 mL-560 mL] 420 mL PEEP:  [5 cmH20-10 cmH20] 8 cmH20 Plateau Pressure:  [27 cmH20-34 cmH20] 30 cmH20   Intake/Output Summary (Last 24 hours) at 11/03/2019 1045 Last data filed at 11/03/2019 1000 Gross per 24 hour  Intake 2998.33 ml  Output 2605 ml  Net 393.33 ml   Filed Weights   11/01/19 0204 11/02/19 0342 11/03/19 0500  Weight: 80.9 kg 80.5 kg 82.7 kg    Examination: General: Adult male, resting in bed, in NAD. Neuro: Sedated but opens eyes to voice. HEENT: [place]/AT. Sclerae anicteric. ETT in place. Cardiovascular: RRR, no M/R/G.  Lungs: Respirations even and unlabored.  CTA bilaterally, No W/R/R. Abdomen: BS x 4, soft, NT/ND.  Musculoskeletal: No gross deformities, no edema.  Skin: Intact, warm, no rashes.  Assessment & Plan:   Acute on chronic (4L since last d/c) hypoxic,hypercapnic respiratory after recent treatment for COVID 19 pneumonia. Suspected PE - new echo c/w RHS with suppressive EF along with + DVT's - concern for HCAP and/or acute PE - f/u CXR stable bl pna - day 5 of ABX cx negative will complete 7 days empiric cefepime (mrsa pcr negative so will stop vanc) -Continue mechanical ventilation per ARDS protocol, hopefully can start weaning tomorrow (attempting trial of lasix today) -Target TVol 6-8cc/kgIBW -Target Plateau Pressure < 30cm H20 -Target driving pressure less than 15 cm of water -Target PaO2 at least 55-65: titrate PEEP/FiO2 per protocol -Ventilator associated pneumonia prevention protocol  Shock - resolved, now off pressors -  change stress steroids to standard decadron - cont supportive care  PEA/VT arrest >> likely from acute hypoxic respiratory failure. Acute on chronic systolic CHF. Hx of CAD s/p CABG, HTN, Hypertriglyceridemia. Persistent afib with rvr - resolved RVR.  Amio stopped 12/29 due to VT - continue ASA, crestor, fenofibrate, heparin  Prolonged qt - improved some, now 439 - maintain K >  4, Mg > 2 - avoid prolonging agents as able - recheck tomorrow 12/30  Hypocalcemia - 1g Ca gluconate  B/l lower leg DVT's. - continue heparin gtt - renal function precludes CT angiogram chest at this time (however echo supportive of acute PE)  Thrombocytopenia: 114->125-->86 > 82 -cont heparin gtt for now but had exposure within past 30 days (with multiple other reasons including sepsis for thrombocytopenia). Intermediate probability for hitt. Heparin ab sent 12/29 but hold on change of heparin for now.   Acute metabolic encephalopathy from hypoxia, sepsis, renal failure. 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 - Stage 2 Rt sacrum, present prior to this admission  Best practice:  Diet: tube feeds DVT prophylaxis: Heparin drip GI prophylaxis: protonix Mobility: bed rest Code Status: Full code Disposition: ICU Family communication: updated wife via phone this am.     CC time: 40 min.   Montey Hora, [facility] 11/03/2019, 11:14 AM

## 2019-11-03 NOTE — Progress Notes (Signed)
eLink Physician-Brief Progress Note Patient Name: BAYLIN SUBA DOB: 14-Dec-1945 MRN: [id number]   Date of Service  11/03/2019  HPI/Events of Note  PT now NPO with NG tube to LIS, blood sugar 130-140's  eICU Interventions  Adjustments made to insulin regimen to reflect NPO status.        [name] Christyanna Mckeon 11/03/2019, 9:11 PM

## 2019-11-03 NOTE — Progress Notes (Signed)
Per bedside RN port of TL without blood return and unable to flush at all.  She changed the cap and still unable to flush.  Informed that due to the catheter type and material, unable to TPA if unable to flush line at all.  RN verbalized understanding.

## 2019-11-04 LAB — GLUCOSE, CAPILLARY
Glucose-Capillary: 165 mg/dL — ABNORMAL HIGH (ref 70–99)
Glucose-Capillary: 166 mg/dL — ABNORMAL HIGH (ref 70–99)
Glucose-Capillary: 148 mg/dL — ABNORMAL HIGH (ref 70–99)
Glucose-Capillary: 156 mg/dL — ABNORMAL HIGH (ref 70–99)
Glucose-Capillary: 143 mg/dL — ABNORMAL HIGH (ref 70–99)
Glucose-Capillary: 158 mg/dL — ABNORMAL HIGH (ref 70–99)
Glucose-Capillary: 148 mg/dL — ABNORMAL HIGH (ref 70–99)

## 2019-11-04 LAB — CBC
HCT: 28 % — ABNORMAL LOW (ref 39.0–52.0)
Hemoglobin: 8.3 g/dL — ABNORMAL LOW (ref 13.0–17.0)
MCH: 25.3 pg — ABNORMAL LOW (ref 26.0–34.0)
MCHC: 29.6 g/dL — ABNORMAL LOW (ref 30.0–36.0)
MCV: 85.4 fL (ref 80.0–100.0)
Platelets: 97 10*3/uL — ABNORMAL LOW (ref 150–400)
RBC: 3.28 MIL/uL — ABNORMAL LOW (ref 4.22–5.81)
RDW: 19.9 % — ABNORMAL HIGH (ref 11.5–15.5)
WBC: 9 10*3/uL (ref 4.0–10.5)
nRBC: 0.2 % (ref 0.0–0.2)

## 2019-11-04 LAB — CULTURE, BLOOD (ROUTINE X 2)
Culture: NO GROWTH
Culture: NO GROWTH
Special Requests: ADEQUATE
Special Requests: ADEQUATE

## 2019-11-04 LAB — BASIC METABOLIC PANEL
Anion gap: 15 (ref 5–15)
BUN: 98 mg/dL — ABNORMAL HIGH (ref 8–23)
CO2: 24 mmol/L (ref 22–32)
Calcium: 7.5 mg/dL — ABNORMAL LOW (ref 8.9–10.3)
Chloride: 114 mmol/L — ABNORMAL HIGH (ref 98–111)
Creatinine, Ser: 3.46 mg/dL — ABNORMAL HIGH (ref 0.61–1.24)
GFR calc Af Amer: 19 mL/min — ABNORMAL LOW (ref >60)
GFR calc non Af Amer: 16 mL/min — ABNORMAL LOW (ref >60)
Glucose, Bld: 190 mg/dL — ABNORMAL HIGH (ref 70–99)
Potassium: 4.6 mmol/L (ref 3.5–5.1)
Sodium: 153 mmol/L — ABNORMAL HIGH (ref 135–145)

## 2019-11-04 LAB — TRIGLYCERIDES: Triglycerides: 133 mg/dL (ref <150)

## 2019-11-04 LAB — HEPARIN LEVEL (UNFRACTIONATED): Heparin Unfractionated: 1.5 IU/mL — ABNORMAL HIGH (ref 0.30–0.70)

## 2019-11-04 LAB — MAGNESIUM: Magnesium: 2.3 mg/dL (ref 1.7–2.4)

## 2019-11-04 LAB — APTT
aPTT: 93 seconds — ABNORMAL HIGH (ref 24–36)
aPTT: 136 seconds — ABNORMAL HIGH (ref 24–36)
aPTT: 67 seconds — ABNORMAL HIGH (ref 24–36)

## 2019-11-04 LAB — PHOSPHORUS: Phosphorus: 4.6 mg/dL (ref 2.5–4.6)

## 2019-11-04 MED ORDER — MIDAZOLAM HCL 2 MG/2ML IJ SOLN
INTRAMUSCULAR | Status: AC
  Filled 2019-11-04: qty 2

## 2019-11-04 MED ORDER — DEXMEDETOMIDINE HCL IN NACL 400 MCG/100ML IV SOLN
0.4000 ug/kg/h | INTRAVENOUS | Status: DC
  Administered 2019-11-04: 0.8 ug/kg/h via INTRAVENOUS
  Administered 2019-11-04: 1.1 ug/kg/h via INTRAVENOUS
  Filled 2019-11-04 (×2): qty 100

## 2019-11-04 MED ORDER — PROPOFOL 1000 MG/100ML IV EMUL
5.0000 ug/kg/min | INTRAVENOUS | Status: DC
  Administered 2019-11-04: 20 ug/kg/min via INTRAVENOUS
  Filled 2019-11-04: qty 100

## 2019-11-04 MED ORDER — FENTANYL 2500MCG IN NS 250ML (10MCG/ML) PREMIX INFUSION
0.0000 ug/h | INTRAVENOUS | Status: DC
  Administered 2019-11-04: 50 ug/h via INTRAVENOUS
  Administered 2019-11-04: 400 ug/h via INTRAVENOUS
  Administered 2019-11-05 (×2): 150 ug/h via INTRAVENOUS
  Administered 2019-11-06: 100 ug/h via INTRAVENOUS
  Administered 2019-11-07: 150 ug/h via INTRAVENOUS
  Filled 2019-11-04 (×6): qty 250

## 2019-11-04 MED ORDER — VECURONIUM BROMIDE 10 MG IV SOLR
10.0000 mg | Freq: Once | INTRAVENOUS | Status: AC
  Administered 2019-11-04: 10 mg via INTRAVENOUS

## 2019-11-04 MED ORDER — PRO-STAT SUGAR FREE PO LIQD
30.0000 mL | Freq: Four times a day (QID) | ORAL | Status: DC
  Administered 2019-11-04 – 2019-11-08 (×15): 30 mL
  Filled 2019-11-04 (×15): qty 30

## 2019-11-04 MED ORDER — JUVEN PO PACK
1.0000 | PACK | Freq: Two times a day (BID) | ORAL | Status: DC
  Administered 2019-11-04 – 2019-11-08 (×8): 1
  Filled 2019-11-04 (×8): qty 1

## 2019-11-04 MED ORDER — VECURONIUM BROMIDE 10 MG IV SOLR
INTRAVENOUS | Status: AC
  Filled 2019-11-04: qty 10

## 2019-11-04 MED ORDER — MIDAZOLAM HCL 2 MG/2ML IJ SOLN
2.0000 mg | Freq: Once | INTRAMUSCULAR | Status: AC
  Administered 2019-11-04: 2 mg via INTRAVENOUS

## 2019-11-04 NOTE — Progress Notes (Signed)
NAME:  Matthew Bates, MRN:  [id number], DOB:  1946-08-21, LOS: 5 ADMISSION DATE:  10/23/2019, CONSULTATION DATE:  10/31/19 CHIEF COMPLAINT:  Cardiac arrest  Brief History   73 yo male was discharged from [facility] on 12/19 after treatment for COVID 19 pneumonia (received remdesivir and steroids), and DVT with presumed PE and sent home on eliquis.  Developed progressive dyspnea with hypoxia and returned to ER on 12/26, and intubated by EMS prior to arrival.  Developed bradycardia with PEA, then VT requiring defibrillation and started on amiodarone.  Past Medical History  CAD Cataract CKD DM GERD GOUT Hepatitis High triglycerides HTN Obesity  Significant Hospital Events   12/05 to 12/19 Admit to [facility] 12/26 Admit to [facility] after PEA/VT cardiac arrest with hypoxia and VDRF 12/29 multiple runs of VT, amio stopped  Consults:    Procedures:  ETT 12/26 >>  Rt femoral CVL 12/26 >>  Rt radial a line 12/27 >>   Significant Diagnostic Tests:  12/11 B/l LE venous doppler: Right: Findings consistent with acute deep vein thrombosis involving the right posterior tibial veins, and right peroneal veins. Left: Findings consistent with acute deep vein thrombosis involving the left soleal veins, and left gastrocnemius veins. No cystic structure found in the popliteal fossa.  CT head 12/27 >> no acute findings Echo 12/27 >> LVEF 45-50%, RV with reduced systolic function and enlargement, LA dilated  Micro Data:  Blood 12/26 >>ngtd Blood 12/27: ngtd Urine 12/27: neg Sputum 12/26 >> not obtained  Antimicrobials:  Vancomycin 12/26 >>12/29 Cefepime 12/26 >>  Zosyn 12/26  Interim history/subjective:  Soft BP after lasix yesterday requiring low dose levo. Vent dysynchrony requiring increased sedation overnight.  Objective   Blood pressure (!) 138/59, pulse 67, temperature 99.5 F (37.5 C), resp. rate (!) 29, height 5\' 9"  (1.753 m), weight 82.8 kg, SpO2 92 %.    Vent Mode: PRVC FiO2 (%):  [45 %]  45 % Set Rate:  [32 bmp] 32 bmp Vt Set:  [420 mL] 420 mL PEEP:  [5 cmH20-8 cmH20] 5 cmH20 Plateau Pressure:  [23 cmH20-31 cmH20] 23 cmH20   Intake/Output Summary (Last 24 hours) at 11/04/2019 0957 Last data filed at 11/04/2019 0900 Gross per 24 hour  Intake 2089.38 ml  Output 2500 ml  Net -410.62 ml   Filed Weights   11/02/19 0342 11/03/19 0500 11/04/19 0426  Weight: 80.5 kg 82.7 kg 82.8 kg    Examination: General: Adult male, resting in bed, in NAD. Neuro: Sedated but opens eyes to voice. HEENT: [place]/AT. Sclerae anicteric. ETT in place. Cardiovascular: RRR, no M/R/G.  Lungs: Respirations even and unlabored.  CTA bilaterally, No W/R/R. Abdomen: BS x 4, soft, NT/ND.  Musculoskeletal: R wrist ecchymosis and edema noted (likely from an ABG stick).  Pulses and cap refill intact, hand /fingers warm.  Otherwise, no edema.  Skin: Intact, warm, no rashes.  Assessment & Plan:   Acute on chronic (4L since last d/c) hypoxic,hypercapnic respiratory after recent treatment for COVID 19 pneumonia. Suspected PE - new echo c/w RHS with suppressive EF along with + DVT's - concern for HCAP and/or acute PE - f/u CXR stable bl pna - day 6 of ABX cx negative will complete 7 days empiric cefepime (mrsa pcr negative so will stop vanc) -Continue mechanical ventilation per ARDS protocol, hopefully can start weaning soon - Hold further lasix given hypotension as well as slightly worsened hypernatremia -Target TVol 6-8cc/kgIBW -Target Plateau Pressure < 30cm H20 -Target driving pressure less than 15 cm  of water -Target PaO2 at least 55-65: titrate PEEP/FiO2 per protocol -Ventilator associated pneumonia prevention protocol  Shock - resolved.  Hypotension at this point likely sedation + effects of lasix. - Continue low dose levo as needed for goal MAP > 65 - cont supportive care  PEA/VT arrest >> likely from acute hypoxic respiratory failure. Acute on chronic systolic CHF. Hx of CAD s/p CABG, HTN,  Hypertriglyceridemia. Persistent afib with rvr - resolved RVR.  Amio stopped 12/29 due to VT - continue ASA, crestor, fenofibrate, heparin  Prolonged qt - improved some, now 439 - maintain K > 4, Mg > 2 - avoid prolonging agents as able - recheck intermittently  B/l lower leg DVT's. - continue heparin gtt per pharm - renal function precludes CT angiogram chest at this time (however echo supportive of acute PE and has + DVTs)  Thrombocytopenia: 114->125-->86 > 82 -cont heparin gtt for now but had exposure within past 30 days (with multiple other reasons including sepsis for thrombocytopenia). Intermediate probability for hitt. Heparin ab sent 12/29 but hold on change of heparin for now.   Acute metabolic encephalopathy from hypoxia, sepsis, renal failure. 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 - Stage 2 Rt sacrum, present prior to this admission  Best practice:  Diet: tube feeds DVT prophylaxis: Heparin drip GI prophylaxis: protonix Mobility: bed rest Code Status: Full code Disposition: ICU Family communication: Will call wife this PM   CC time: 30 min.   Montey Hora, [place] [facility] 11/04/2019, 9:57 AM

## 2019-11-04 NOTE — Progress Notes (Signed)
eLink Physician-Brief Progress Note Patient Name: FAHD ANTCZAK DOB: 1946-06-17 MRN: [id number]   Date of Service  11/04/2019  HPI/Events of Note  Pt with ventilator dyssynchrony secondary to inadequate sedation on the ventilator.  eICU Interventions  Fentanyl infusion added to current sedation regimen.        [name] Jeriah Corkum 11/04/2019, 3:43 AM

## 2019-11-04 NOTE — Progress Notes (Signed)
 Nutrition Follow-up  DOCUMENTATION CODES:   Not applicable  INTERVENTION:   If continues with intolerance to TF, recommend considering erythromycin for gut motility and/o rpost-pyloric Cortrak placement  Tube Feeding:  Begin TF at 20 ml/hr; titrate by 10 mL q 8 hours until goal rate of 50 ml/hr Goal rate: Vital 1.5 at 50 ml/hr Increase Pro-Stat to 30 mL QID Provides 2200 kcals, 141 g of protein and 912 mL of free water   Add Juven BID, each packet provides 80 calories, 8 grams of carbohydrate, 2.5  grams of protein (collagen), 7 grams of L-arginine and 7 grams of L-glutamine; supplement contains CaHMB, Vitamins C, E, B12 and Zinc to promote wound healing     NUTRITION DIAGNOSIS:   Inadequate oral intake related to inability to eat as evidenced by NPO status.  Being addressed via TF   GOAL:   Patient will meet greater than or equal to 90% of their needs  Progressing   MONITOR:   Vent status, Labs, Weight trends, TF tolerance, Skin, I & O's  REASON FOR ASSESSMENT:   Ventilator, Consult Enteral/tube feeding initiation and management  ASSESSMENT:   73 year old male who presented to the ED on 12/26 with SOB and COVID-19 positive. Pt had been discharged 1 week prior after an admission for COVID-19 pneumonia. PMH of CHF, CAD, DM, HTN, GERD. Pt required intubation in the ED. During transport on 12/26, pt experienced PEA arrest.  RD working remotely.  12/19 Discharge from [facility] post treatment for [place] pneumonia 12/26 Admit to [facility] post PEA/VT arrest, DVRF 12/28 TF initiated 12/29 Vomited, OG to LIS with 700 mL return 12/31 TF resumed  OG tube in place  Vent dyssnychrony overnight, diprivan off  Patient is currently intubated on ventilator support, fentanyl and precedex MV: 14.3 L/min Temp (24hrs), Avg:99.2 F (37.3 C), Min:98.2 F (36.8 C), Max:100 F (37.8 C)  Propofol: OFF  Consult received to resume TF today  New stage II to lip, pt with DTI to  sacrum  MD addressing hypernatremic with free water flushes  Labs: sodium 153 (H) Meds: decadron, precedex, ss novolog, levemir  Diet Order:   Diet Order            Diet NPO time specified  Diet effective now              EDUCATION NEEDS:   No education needs have been identified at this time  Skin:  Skin Assessment: Skin Integrity Issues: Skin Integrity Issues:: Stage II, DTI DTI: sacrum Stage II: lip, sacrum  Last BM:  10/25/2019 medium type 4  Height:   Ht Readings from Last 1 Encounters:  10/05/2019 5\' 9"  (1.753 m)    Weight:   Wt Readings from Last 1 Encounters:  11/04/19 82.8 kg    Ideal Body Weight:  72.7 kg  BMI:  Body mass index is 26.96 kg/m.  Estimated Nutritional Needs:   Kcal:  2020-2250 kcals  Protein:  115-150 g  Fluid:  >/= 2.0 L    Elisea Khader MS, RDN, LDN, CNSC [phone] Pager  [phone] Weekend/On-Call Pager

## 2019-11-04 NOTE — Progress Notes (Signed)
Assisted tele visit to patient with family member.  Matthew Bates P, RN  

## 2019-11-04 NOTE — Progress Notes (Addendum)
eLink Physician-Brief Progress Note Patient Name: Matthew Bates DOB: 1946-04-05 MRN: [id number]   Date of Service  11/04/2019  HPI/Events of Note  Pt is still dyssynchronous on the ventilator  eICU Interventions  Propofol substituted for Precedex, Versed 3 mg iv x 1, Vecuronium 10 mg iv x 1        Jamirah Zelaya U Novi Calia 11/04/2019, 5:20 AM

## 2019-11-04 NOTE — Progress Notes (Signed)
PCCM Interval Progress Note  Called spouse and updated her over the phone.   Matthew Bates, [place] [facility] 11/04/2019, 1:13 PM

## 2019-11-04 NOTE — Progress Notes (Signed)
ANTICOAGULATION CONSULT NOTE  Pharmacy Consult for Heparin Indication: pulmonary embolus and DVT   Patient Measurements: Height: 5\' 9"  (175.3 cm) Weight: 182 lb 5.1 oz (82.7 kg) IBW/kg (Calculated) : 70.7  Heparin Dosing Weight: 82.7 kg  Vital Signs: Temp: 100 F (37.8 C) (12/31 0113) BP: 114/51 (12/30 1800) Pulse Rate: 70 (12/31 0113)  Labs: Recent Labs    11/01/19 0316 11/02/19 0335 11/02/19 0335 11/02/19 0338 11/02/19 0338 11/02/19 1749 11/03/19 0208 11/03/19 0258 11/03/19 1600 11/04/19 0215  HGB 10.2* 9.2*  --  9.2*  --   --  9.0* 8.8*  --   --   HCT 33.5* 31.1*  --  27.0*  --   --  30.7* 26.0*  --   --   PLT 125* 86*  --   --   --   --  82*  --   --   --   APTT 71* 52*   < >  --    < >  --  65*  --  95* 93*  HEPARINUNFRC >2.20* >2.20*  --   --   --   --  >2.20*  --   --   --   CREATININE 3.36* 3.72*  --   --   --  3.54* 3.46*  --   --   --    < > = values in this interval not displayed.    Assessment: 73 yr old male had been discharged on 12/19 from [place] after stay for COVID-19, and returned to [facility] ED early 12/26 with worsening SOB >> plan to admit for acute respiratory failure >> then, suffered cardiac arrest prior to transfer to inpatient unit >> transferred to [facility] ICU for further treatment.  Pt was diagnosed with PE prior to previous discharge and was started on apixaban, last dose 10/29/19. Earlier today, heparin level was >2.2 units/ml, with subtherapeutic aPTT (65 sec), indicted that apixaban is still affecting heparin level. HIT antibody was sent yesterday, due to drop in platelets - test results still pending.  aPTT drawn ~8 hrs after heparin infusion was increased to 1000 units/hr is 95 sec, which is within the goal range for this patient. H/H 8.8/26.0, stable. Per RN, no issues with IV or bleeding observed.  12/31 AM update:  APTT therapeutic x 2 at 1000 units/hr of heparin  Goal of Therapy:  Heparin level 0.3-0.7 units/ml  aPTT 66 - 102  seconds Monitor platelets by anticoagulation protocol: Yes   Plan:  Continue heparin infusion at 1000 units/hr Monitor daily heparin level, aPTT, CBC Monitor for signs/symptoms of bleeding  Narda Bonds, PharmD, BCPS Clinical Pharmacist Phone: [phone]

## 2019-11-04 NOTE — Progress Notes (Signed)
ANTICOAGULATION CONSULT NOTE - Follow Up Consult  Pharmacy Consult for Heparin Indication: pulmonary embolus  Allergies  Allergen Reactions  . Metoclopramide Hcl Hypertension    Reglan=Increases HBP symptoms    Patient Measurements: Height: 5\' 9"  (175.3 cm) Weight: 182 lb 8.7 oz (82.8 kg) IBW/kg (Calculated) : 70.7 Heparin Dosing Weight: 82.8 kg  Vital Signs: Temp: 99.5 F (37.5 C) (12/31 0915) Temp Source: Bladder (12/31 0800) BP: 138/59 (12/31 0800) Pulse Rate: 67 (12/31 0915)  Labs: Recent Labs    11/02/19 0335 11/02/19 [id number] 11/02/19 1749 11/03/19 0208 11/03/19 0258 11/03/19 1600 11/04/19 0215 11/04/19 0449 11/04/19 0450 11/04/19 0742  HGB 9.2*  --   --  9.0* 8.8*  --   --   --  8.3*  --   HCT 31.1*  --   --  30.7* 26.0*  --   --   --  28.0*  --   PLT 86*  --   --  82*  --   --   --   --  97*  --   APTT 52*   < >  --  65*  --  95* 93*  --   --  136*  HEPARINUNFRC >2.20*  --   --  >2.20*  --   --   --   --  1.50*  --   CREATININE 3.72*  --  3.54* 3.46*  --   --   --  3.46*  --   --    < > = values in this interval not displayed.    Estimated Creatinine Clearance: 18.7 mL/min (A) (by C-G formula based on SCr of 3.46 mg/dL (H)).   Assessment:   Anticoag: hep gtt for PE; apixaban PTA last dose 12/25 pm (was on loading dose). HL still high 1.5 from Eliquis interaction, aPTT 136 Hgb 8.3 drifting down, Plts 125 > 97 HIT Ab>>OD 0.109 WNL  Goal of Therapy:  aPTT 66-102 seconds Monitor platelets by anticoagulation protocol: Yes   Plan:  Decrease IV heparin to 800 units/hr and recheck in 6 hrs. Daily HL aPTT CBC    Glennie Bose S. Alford Highland, PharmD, BCPS Clinical Staff Pharmacist [email or web address] Alford Highland, [employer] 11/04/2019,9:51 AM

## 2019-11-04 NOTE — Progress Notes (Signed)
ANTICOAGULATION CONSULT NOTE - Follow Up Consult  Pharmacy Consult for Heparin Indication: pulmonary embolus  Allergies  Allergen Reactions  . Metoclopramide Hcl Hypertension    Reglan=Increases HBP symptoms    Patient Measurements: Height: 5\' 9"  (175.3 cm) Weight: 182 lb 8.7 oz (82.8 kg) IBW/kg (Calculated) : 70.7 Heparin Dosing Weight: 82.8 kg  Vital Signs: Temp: 98.8 F (37.1 C) (12/31 1615) Temp Source: Bladder (12/31 1200) BP: 118/58 (12/31 1600) Pulse Rate: 60 (12/31 1615)  Labs: Recent Labs    11/02/19 0335 11/02/19 [id number] 11/02/19 1749 11/03/19 [id number] 11/03/19 [id number] 11/03/19 0258 11/04/19 0215 11/04/19 0449 11/04/19 0450 11/04/19 0742 11/04/19 1630  HGB 9.2*  --   --  9.0*  --  8.8*  --   --  8.3*  --   --   HCT 31.1*  --   --  30.7*  --  26.0*  --   --  28.0*  --   --   PLT 86*  --   --  82*  --   --   --   --  97*  --   --   APTT 52*   < >  --  65*   < >  --  93*  --   --  136* 67*  HEPARINUNFRC >2.20*  --   --  >2.20*  --   --   --   --  1.50*  --   --   CREATININE 3.72*  --  3.54* 3.46*  --   --   --  3.46*  --   --   --    < > = values in this interval not displayed.    Estimated Creatinine Clearance: 18.7 mL/min (A) (by C-G formula based on SCr of 3.46 mg/dL (H)).   Assessment:   Anticoag: hep gtt for PE; apixaban PTA last dose 12/25 pm (was on loading dose). HL still high 1.5 from Eliquis interaction, aPTT 136 Hgb 8.3 drifting down, Plts 125 > 97 HIT Ab>>OD 0.109 WNL  PM Follow up: PM aPTT down to 67 sec (low end of therapeutic range).  No overt bleeding or complications noted.  Goal of Therapy:  aPTT 66-102 seconds Monitor platelets by anticoagulation protocol: Yes   Plan:  Increase IV heparin to 900 units/hr. Recheck aPTT with AM labs.  Marguerite Olea, [facility] Clinical Pharmacist Phone [phone]  11/04/2019 5:11 PM

## 2019-11-04 NOTE — Progress Notes (Signed)
Assisted tele visit to patient with wife.  Zamorah Ailes P, RN  

## 2019-11-05 ENCOUNTER — Inpatient Hospital Stay (HOSPITAL_COMMUNITY): Payer: Medicare HMO

## 2019-11-05 LAB — POCT I-STAT 7, (LYTES, BLD GAS, ICA,H+H)
Acid-Base Excess: 1 mmol/L (ref 0.0–2.0)
Bicarbonate: 28 mmol/L (ref 20.0–28.0)
Bicarbonate: 27.4 mmol/L (ref 20.0–28.0)
Calcium, Ion: 1.12 mmol/L — ABNORMAL LOW (ref 1.15–1.40)
Calcium, Ion: 1.13 mmol/L — ABNORMAL LOW (ref 1.15–1.40)
HCT: 25 % — ABNORMAL LOW (ref 39.0–52.0)
HCT: 27 % — ABNORMAL LOW (ref 39.0–52.0)
Hemoglobin: 8.5 g/dL — ABNORMAL LOW (ref 13.0–17.0)
Hemoglobin: 9.2 g/dL — ABNORMAL LOW (ref 13.0–17.0)
O2 Saturation: 92 %
O2 Saturation: 94 %
Patient temperature: 36.9
Patient temperature: 37.4
Potassium: 4.8 mmol/L (ref 3.5–5.1)
Potassium: 5.2 mmol/L — ABNORMAL HIGH (ref 3.5–5.1)
Sodium: 152 mmol/L — ABNORMAL HIGH (ref 135–145)
Sodium: 153 mmol/L — ABNORMAL HIGH (ref 135–145)
TCO2: 30 mmol/L (ref 22–32)
TCO2: 29 mmol/L (ref 22–32)
pCO2 arterial: 54.9 mmHg — ABNORMAL HIGH (ref 32.0–48.0)
pCO2 arterial: 58.6 mmHg — ABNORMAL HIGH (ref 32.0–48.0)
pH, Arterial: 7.315 — ABNORMAL LOW (ref 7.350–7.450)
pH, Arterial: 7.28 — ABNORMAL LOW (ref 7.350–7.450)
pO2, Arterial: 69 mmHg — ABNORMAL LOW (ref 83.0–108.0)
pO2, Arterial: 84 mmHg (ref 83.0–108.0)

## 2019-11-05 LAB — BASIC METABOLIC PANEL
Anion gap: 12 (ref 5–15)
BUN: 108 mg/dL — ABNORMAL HIGH (ref 8–23)
CO2: 26 mmol/L (ref 22–32)
Calcium: 7.7 mg/dL — ABNORMAL LOW (ref 8.9–10.3)
Chloride: 115 mmol/L — ABNORMAL HIGH (ref 98–111)
Creatinine, Ser: 3.21 mg/dL — ABNORMAL HIGH (ref 0.61–1.24)
GFR calc Af Amer: 21 mL/min — ABNORMAL LOW (ref >60)
GFR calc non Af Amer: 18 mL/min — ABNORMAL LOW (ref >60)
Glucose, Bld: 176 mg/dL — ABNORMAL HIGH (ref 70–99)
Potassium: 5 mmol/L (ref 3.5–5.1)
Sodium: 153 mmol/L — ABNORMAL HIGH (ref 135–145)

## 2019-11-05 LAB — GLUCOSE, CAPILLARY
Glucose-Capillary: 119 mg/dL — ABNORMAL HIGH (ref 70–99)
Glucose-Capillary: 152 mg/dL — ABNORMAL HIGH (ref 70–99)
Glucose-Capillary: 157 mg/dL — ABNORMAL HIGH (ref 70–99)
Glucose-Capillary: 214 mg/dL — ABNORMAL HIGH (ref 70–99)
Glucose-Capillary: 256 mg/dL — ABNORMAL HIGH (ref 70–99)
Glucose-Capillary: 263 mg/dL — ABNORMAL HIGH (ref 70–99)

## 2019-11-05 LAB — CBC
HCT: 29.9 % — ABNORMAL LOW (ref 39.0–52.0)
Hemoglobin: 8.6 g/dL — ABNORMAL LOW (ref 13.0–17.0)
MCH: 25.3 pg — ABNORMAL LOW (ref 26.0–34.0)
MCHC: 28.8 g/dL — ABNORMAL LOW (ref 30.0–36.0)
MCV: 87.9 fL (ref 80.0–100.0)
Platelets: 154 10*3/uL (ref 150–400)
RBC: 3.4 MIL/uL — ABNORMAL LOW (ref 4.22–5.81)
RDW: 20.2 % — ABNORMAL HIGH (ref 11.5–15.5)
WBC: 11.1 10*3/uL — ABNORMAL HIGH (ref 4.0–10.5)
nRBC: 0.3 % — ABNORMAL HIGH (ref 0.0–0.2)

## 2019-11-05 LAB — CULTURE, BLOOD (ROUTINE X 2)
Culture: NO GROWTH
Culture: NO GROWTH
Special Requests: ADEQUATE

## 2019-11-05 LAB — PHOSPHORUS: Phosphorus: 5.4 mg/dL — ABNORMAL HIGH (ref 2.5–4.6)

## 2019-11-05 LAB — HEPARIN LEVEL (UNFRACTIONATED): Heparin Unfractionated: 1.56 IU/mL — ABNORMAL HIGH (ref 0.30–0.70)

## 2019-11-05 LAB — MAGNESIUM: Magnesium: 2.5 mg/dL — ABNORMAL HIGH (ref 1.7–2.4)

## 2019-11-05 LAB — TRIGLYCERIDES: Triglycerides: 192 mg/dL — ABNORMAL HIGH (ref <150)

## 2019-11-05 LAB — APTT: aPTT: 67 seconds — ABNORMAL HIGH (ref 24–36)

## 2019-11-05 LAB — PROCALCITONIN: Procalcitonin: 0.34 ng/mL

## 2019-11-05 MED ORDER — FREE WATER
300.0000 mL | Status: DC
  Administered 2019-11-05 – 2019-11-07 (×10): 300 mL

## 2019-11-05 NOTE — Progress Notes (Signed)
ANTICOAGULATION CONSULT NOTE - Follow Up Consult  Pharmacy Consult for Heparin Indication: pulmonary embolus  Allergies  Allergen Reactions  . Metoclopramide Hcl Hypertension    Reglan=Increases HBP symptoms    Patient Measurements: Height: 5\' 9"  (175.3 cm) Weight: 181 lb 3.5 oz (82.2 kg) IBW/kg (Calculated) : 70.7 Heparin Dosing Weight: 82.8 kg  Vital Signs: Temp: 99 F (37.2 C) (01/01 1000) Temp Source: Bladder (01/01 0400) BP: 165/85 (01/01 1000) Pulse Rate: 96 (01/01 1000)  Labs: Recent Labs    11/03/19 0208 11/03/19 0258 11/04/19 0215 11/04/19 0449 11/04/19 0450 11/04/19 0742 11/04/19 1630 11/05/19 0148 11/05/19 0340 11/05/19 0438  HGB 9.0*  --    < >  --  8.3*  --   --  8.5*  --  8.6*  HCT 30.7*  --   --   --  28.0*  --   --  25.0*  --  29.9*  PLT 82*  --   --   --  97*  --   --   --   --  154  APTT 65*   < >  --   --   --  136* 67*  --  67*  --   HEPARINUNFRC >2.20*  --   --   --  1.50*  --   --   --  1.56*  --   CREATININE 3.46*  --   --  3.46*  --   --   --   --  3.21*  --    < > = values in this interval not displayed.    Estimated Creatinine Clearance: 20.2 mL/min (A) (by C-G formula based on SCr of 3.21 mg/dL (H)).   Assessment: AM aPTT the same at 67 sec (low end of therapeutic range).  No overt bleeding or complications noted. Will increase to 1000 units to see if we can get in the middle to high aPTT range due to COVID  Goal of Therapy:  aPTT 66-102 seconds Monitor platelets by anticoagulation protocol: Yes   Plan:  Increase IV heparin to 1000 units/hr. Recheck aPTT with AM labs.  Nicoletta Dress, PharmD PGY2 Infectious Disease Pharmacy Resident   11/05/2019 11:53 AM

## 2019-11-05 NOTE — Progress Notes (Signed)
Assisted tele visit to patient with son.  Maryelizabeth Rowan, RN

## 2019-11-05 NOTE — Progress Notes (Signed)
eLink Physician-Brief Progress Note Patient Name: HARLAN MATHES DOB: [birth year] MRN: [id number]   Date of Service  11/05/2019  HPI/Events of Note  ABG on 50%/PRVC 32/TV 420/P 5 = 7.315/54.9/69.0. Ppeak = 20's and have not changed.   eICU Interventions  Will order: 1. Change 5 AM portable CXR to STAT.  2. Increase FiO2 to 60% and increase PEEP to 10.     Intervention Category Major Interventions: Hypoxemia - evaluation and management;Respiratory failure - evaluation and management  Halsey Persaud [place] 11/05/2019, 2:02 AM

## 2019-11-05 NOTE — Progress Notes (Signed)
Assisted tele visit to patient with wife.  Maryelizabeth Rowan, RN

## 2019-11-05 NOTE — Progress Notes (Signed)
NAME:  Matthew Bates, MRN:  [id number], DOB:  1945-12-25, LOS: 6 ADMISSION DATE:  10/07/2019, CONSULTATION DATE:  10/31/19 CHIEF COMPLAINT:  Cardiac arrest  Brief History   74 yo male was discharged from [facility] on 12/19 after treatment for COVID 19 pneumonia (received remdesivir and steroids), and DVT with presumed PE and sent home on eliquis.  Developed progressive dyspnea with hypoxia and returned to ER on 12/26, and intubated by EMS prior to arrival.  Developed bradycardia with PEA, then VT requiring defibrillation and started on amiodarone.  Past Medical History  CAD Cataract CKD DM GERD GOUT Hepatitis High triglycerides HTN Obesity  Significant Hospital Events   12/05 to 12/19 Admit to [facility] 12/26 Admit to [facility] after PEA/VT cardiac arrest with hypoxia and VDRF. ECHO 12/6  - lvef 45% and repeat 12/27 - similar 12/29 multiple runs of VT, amio stopped 12/31 - Soft BP after lasix yesterday requiring low dose levo.Vent dysynchrony requiring increased sedation overnight.  Consults:    Procedures:  ETT 12/26 >>  Rt femoral CVL 12/26 >>  Rt radial a line 12/27 >>   Significant Diagnostic Tests:  12/11 B/l LE venous doppler: Right: Findings consistent with acute deep vein thrombosis involving the right posterior tibial veins, and right peroneal veins. Left: Findings consistent with acute deep vein thrombosis involving the left soleal veins, and left gastrocnemius veins. No cystic structure found in the popliteal fossa.  CT head 12/27 >> no acute findings Echo 12/27 >> LVEF 45-50%, RV with reduced systolic function and enlargement, LA dilated  Micro Data:  Blood 12/26 >>ngtd Blood 12/27: ngtd Urine 12/27: neg Sputum 12/26 >> not obtained  Antimicrobials:  Vancomycin 12/26 >>12/29 Cefepime 12/26 >>  1/1 Zosyn 12/26  Interim history/subjective:    11/05/2019 -  On vent. Off pededex, Off levophed gtt. Remains on fent gtt and on IV heparin gtt. Increased fio2 need last night to  60% (now 50%) and peep 10.  T max 99/73F. WBC 10-11k. PCT coming down through 11/03/2019  AKI better - creat 3.2 but Na worse 153 and K 5.0  Objective   Blood pressure (!) 165/85, pulse 96, temperature 99 F (37.2 C), resp. rate (!) 29, height 5\' 9"  (1.753 m), weight 82.2 kg, SpO2 95 %.    Vent Mode: PRVC FiO2 (%):  [45 %-60 %] 50 % Set Rate:  [32 bmp] 32 bmp Vt Set:  [420 mL] 420 mL PEEP:  [5 cmH20-10 cmH20] 10 cmH20 Plateau Pressure:  [24 cmH20-31 cmH20] 31 cmH20   Intake/Output Summary (Last 24 hours) at 11/05/2019 1124 Last data filed at 11/05/2019 1028 Gross per 24 hour  Intake 2621 ml  Output 1820 ml  Net 801 ml   Filed Weights   11/03/19 0500 11/04/19 0426 11/05/19 0100  Weight: 82.7 kg 82.8 kg 82.2 kg   General Appearance:  Looks criticall ill Head:  Normocephalic, without obvious abnormality, atraumatic Eyes:  PERRL - yes, conjunctiva/corneas - muddy     Ears:  Normal external ear canals, both ears Nose:  G tube - no Throat:  ETT TUBE - yes , OG tube - yes on TF Neck:  Supple,  No enlargement/tenderness/nodules Lungs: Clear to auscultation bilaterally, Ventilator   Synchrony - yes 50% fio2, peep 10 Heart:  S1 and S2 normal, no murmur, CVP - no.  Pressors - no Abdomen:  Soft, no masses, no organomegaly Genitalia / Rectal:  Not done Extremities:  Extremities- intact Skin:  ntact in exposed areas . Sacral area -  not examiend Neurologic:  Sedation - fent gtt -> RASS - -3\ . Moves all 4s - yes weakly. CAM-ICU - x . Orientation - x  RESOLVED   Shock - resolved.  Hypotension at this point likely sedation + effects of lasix. - Continue low dose levo as needed for goal MAP > 65 - cont supportive care    Assessment & Plan:   Acute on chronic (4L since last d/c) hypoxic,hypercapnic respiratory after recent treatment for COVID 19 pneumonia. Suspected PE - new echo c/w RHS with suppressive EF along with + DVT's   11/05/2019 - > does not meet criteria for SBT/Extubation in  setting of Acute Respiratory Failure due to high fio2/peep needs  Plan  - check abg to decide on prone ventilation - VAP bundele    - ARDS Measure  - Target TVol 6-8cc/kgIBW  - Target Plateau Pressure < 30cm H20  - Target driving pressure less than 15 cm of water  - Target PaO2 55-65: titrate PEEP/FiO2 per protocol  - As long as PaO2 to FiO2 ratio is less than 1:150 position in prone position for 16 hours a day   COVID -19 - s/p remdesivir  11/05/2019 - on decadron  Plan  =- continue decadron    Concern for HCAP  11/05/2019  - ? Low grade fever  Plan  - last day abx 11/05/2019 - recheck sputum culture given increased fio2 needs  - rechck PCT    PEA/VT arrest >> likely from acute hypoxic respiratory failure. Acute on chronic systolic CHF. Hx of CAD s/p CABG, HTN, Hypertriglyceridemia. Persistent afib with rvr - resolved RVR.  Amio stopped 12/29 due to VT  11/05/2019 - stable on IV heparin gttn. At risk increased Qtc  Plan - continue ASA, crestor, fenofibrate, heparin -check CK/MB  - check ekg 11/06/19   B/l lower leg DVT's. renal function precludes CT angiogram chest at this time (however echo supportive of acute PE and has + DVTs)  Plan - continue heparin gtt per pharm    Thrombocytopenia: 114->125-->86 > 82 throuhg 12/30 and improved while on heparin. Picture not c/w HITT. Probably medical illness related  plan -cont heparin gtt for now  - await  hitt. Heparin ab sent [id number]   Acute metabolic encephalopathy from hypoxia, sepsis, renal failure. 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 - Stage 2 Rt sacrum, present prior to this admission  Best practice:  Diet: tube feeds DVT prophylaxis: Heparin drip GI prophylaxis: protonix Mobility: bed rest Code Status: Full code Disposition: ICU Family communication: wife Quashawn Fikes updated over phone above details       [place]   The patient KARANVEER LEMBERG is critically ill with multiple organ systems failure and requires high complexity decision making for assessment and support, frequent evaluation and titration of therapies, application of advanced monitoring technologies and extensive interpretation of multiple databases.   Critical Care Time devoted to patient care services described in this note is  40  Minutes. This time  reflects time of care of this signee Dr Brand Males. This critical care time does not reflect procedure time, or teaching time or supervisory time of PA/NP/Med student/Med Resident etc but could involve care discussion time     Dr. Brand Males, M.D., [facility].C.P Pulmonary and Critical Care Medicine Staff Physician [place] [facility] Pager: [phone], If no answer or between  15:00h - 7:00h: call [phone]  11/05/2019 11:24 AM     LABS    PULMONARY Recent Labs  Lab 10/31/19 0507 11/01/19 0355 11/02/19 0338 11/03/19 0258 11/05/19 0148  PHART 7.350 7.342* 7.304* 7.236* 7.315*  PCO2ART 46.9 42.7 52.6* 67.5* 54.9*  PO2ART 73.0* 78.0* 86.0 92.0 69.0*  HCO3 26.1 23.2 26.1 28.7* 28.0  TCO2 28 24 28 31 30   O2SAT 94.0 95.0 95.0 95.0 92.0    CBC Recent Labs  Lab 11/03/19 0208 11/04/19 0450 11/05/19 0148 11/05/19 0438  HGB 9.0* 8.3* 8.5* 8.6*  HCT 30.7* 28.0* 25.0* 29.9*  WBC 11.0* 9.0  --  11.1*  PLT 82* 97*  --  154    COAGULATION Recent Labs  Lab 10/31/19 0007  INR 3.9*    CARDIAC  No results for input(s): TROPONINI in the last 168 hours. No  results for input(s): PROBNP in the last 168 hours.   CHEMISTRY Recent Labs  Lab 11/01/19 0316 11/01/19 1700 11/02/19 0335 11/02/19 1749 11/03/19 0208 11/03/19 0258 11/04/19 0449 11/05/19 0148 11/05/19 0340  NA   < >  --  146* 148* 150* 150* 153* 152* 153*  K   < >  --  4.4 4.5 4.7 4.7 4.6 4.8 5.0  CL  --   --  109 113* 114*  --  114*  --  115*  CO2  --   --  24 23 27   --  24  --  26  GLUCOSE  --   --  240* 290* 146*  --  190*  --  176*  BUN  --   --  96* 97* 97*  --  98*  --  108*  CREATININE  --   --  3.72* 3.54* 3.46*  --  3.46*  --  3.21*  CALCIUM  --   --  7.3* 7.3* 7.4*  --  7.5*  --  7.7*  MG   < > 2.2 2.3 2.3 2.4  --  2.3  --  2.5*  PHOS  --  5.2* 5.2*  --  4.8*  --  4.6  --  5.4*   < > = values in this interval not displayed.   Estimated Creatinine Clearance: 20.2 mL/min (A) (by C-G formula based on SCr of 3.21 mg/dL (H)).   LIVER Recent Labs  Lab 11/01/2019 0537 10/31/19 0007 11/01/19 0316 11/02/19 0335 11/03/19 0208  AST 40 111* 43* 36 37  ALT 26 56* 41 38 34  ALKPHOS 110 98 84 89 96  BILITOT 0.9 1.4* 0.8 0.8 0.6  PROT 7.4 5.9* 5.2* 5.3* 5.8*  ALBUMIN 3.0* 2.1* 1.7* 1.8* 1.9*  INR  --  3.9*  --   --   --      INFECTIOUS Recent Labs  Lab 10/31/19 0238 11/01/19 1227 11/01/19 1645 11/02/19 0335 11/03/19 0208  LATICACIDVEN 2.0* 1.7 1.5  --   --   PROCALCITON  --  1.77  --  1.30 0.86     ENDOCRINE CBG (last 3)  Recent Labs    11/04/19 2318 11/05/19  [id number] 11/05/19 0815  GLUCAP 148* 152* 119*         IMAGING x48h  - image(s) personally visualized  -   highlighted in bold DG Chest Port 1 View AM  Result Date: 11/05/2019 CLINICAL DATA:  Shortness of breath. Hypoxia.  COVID-19 positive. EXAM: PORTABLE CHEST 1 VIEW COMPARISON:  Radiograph 11/03/2019, additional priors. FINDINGS: Endotracheal tube tip 5.1 cm from the carina. Enteric tube tip and side-port below the diaphragm. Post median sternotomy and CABG. Upper normal heart size, unchanged.  Diffuse bilateral heterogeneous opacities have not significantly changed from prior exam. No pneumothorax. No large pleural effusion. IMPRESSION: 1. Diffuse bilateral airspace disease consistent with COVID-19 pneumonia. No significant change from prior exam. 2. Stable support apparatus. Electronically Signed   By: Keith Rake M.D.   On: 11/05/2019 03:21

## 2019-11-05 NOTE — Progress Notes (Signed)
RT NOTE: RT advanced ETT 2cm per MD order with RN at bedside. ETT is now 28cm at the lips. RT will continue to monitor.

## 2019-11-05 NOTE — Progress Notes (Signed)
eLink Physician-Brief Progress Note Patient Name: Matthew Bates DOB: 1946-08-30 MRN: [id number]   Date of Service  11/05/2019  HPI/Events of Note  Nursing notes [place] emphysema around patient's neck.   eICU Interventions  Will order: 1. Portable  CXR STAT to r/o pneumothorax.     Intervention Category Major Interventions: Other:  Lysle Dingwall 11/05/2019, 11:13 PM

## 2019-11-05 DEATH — deceased

## 2019-11-06 ENCOUNTER — Inpatient Hospital Stay (HOSPITAL_COMMUNITY): Payer: Medicare HMO

## 2019-11-06 DIAGNOSIS — U071 COVID-19: Secondary | ICD-10-CM

## 2019-11-06 DIAGNOSIS — J939 Pneumothorax, unspecified: Secondary | ICD-10-CM

## 2019-11-06 LAB — CK TOTAL AND CKMB (NOT AT ARMC)
CK, MB: 3.4 ng/mL (ref 0.5–5.0)
Relative Index: 3.3 — ABNORMAL HIGH (ref 0.0–2.5)
Total CK: 102 U/L (ref 49–397)

## 2019-11-06 LAB — COMPREHENSIVE METABOLIC PANEL
ALT: 24 U/L (ref 0–44)
AST: 21 U/L (ref 15–41)
Albumin: 1.9 g/dL — ABNORMAL LOW (ref 3.5–5.0)
Alkaline Phosphatase: 75 U/L (ref 38–126)
Anion gap: 11 (ref 5–15)
BUN: 130 mg/dL — ABNORMAL HIGH (ref 8–23)
CO2: 26 mmol/L (ref 22–32)
Calcium: 7.8 mg/dL — ABNORMAL LOW (ref 8.9–10.3)
Chloride: 116 mmol/L — ABNORMAL HIGH (ref 98–111)
Creatinine, Ser: 3.2 mg/dL — ABNORMAL HIGH (ref 0.61–1.24)
GFR calc Af Amer: 21 mL/min — ABNORMAL LOW (ref >60)
GFR calc non Af Amer: 18 mL/min — ABNORMAL LOW (ref >60)
Glucose, Bld: 293 mg/dL — ABNORMAL HIGH (ref 70–99)
Potassium: 5.3 mmol/L — ABNORMAL HIGH (ref 3.5–5.1)
Sodium: 153 mmol/L — ABNORMAL HIGH (ref 135–145)
Total Bilirubin: 0.7 mg/dL (ref 0.3–1.2)
Total Protein: 5.9 g/dL — ABNORMAL LOW (ref 6.5–8.1)

## 2019-11-06 LAB — MAGNESIUM: Magnesium: 2.7 mg/dL — ABNORMAL HIGH (ref 1.7–2.4)

## 2019-11-06 LAB — CBC
HCT: 31.3 % — ABNORMAL LOW (ref 39.0–52.0)
Hemoglobin: 8.7 g/dL — ABNORMAL LOW (ref 13.0–17.0)
MCH: 25.1 pg — ABNORMAL LOW (ref 26.0–34.0)
MCHC: 27.8 g/dL — ABNORMAL LOW (ref 30.0–36.0)
MCV: 90.2 fL (ref 80.0–100.0)
Platelets: 188 10*3/uL (ref 150–400)
RBC: 3.47 MIL/uL — ABNORMAL LOW (ref 4.22–5.81)
RDW: 20.3 % — ABNORMAL HIGH (ref 11.5–15.5)
WBC: 11.4 10*3/uL — ABNORMAL HIGH (ref 4.0–10.5)
nRBC: 0.5 % — ABNORMAL HIGH (ref 0.0–0.2)

## 2019-11-06 LAB — GLUCOSE, CAPILLARY
Glucose-Capillary: 213 mg/dL — ABNORMAL HIGH (ref 70–99)
Glucose-Capillary: 250 mg/dL — ABNORMAL HIGH (ref 70–99)
Glucose-Capillary: 252 mg/dL — ABNORMAL HIGH (ref 70–99)
Glucose-Capillary: 253 mg/dL — ABNORMAL HIGH (ref 70–99)
Glucose-Capillary: 254 mg/dL — ABNORMAL HIGH (ref 70–99)
Glucose-Capillary: 270 mg/dL — ABNORMAL HIGH (ref 70–99)

## 2019-11-06 LAB — TRIGLYCERIDES: Triglycerides: 186 mg/dL — ABNORMAL HIGH (ref <150)

## 2019-11-06 LAB — PROCALCITONIN: Procalcitonin: 0.44 ng/mL

## 2019-11-06 LAB — LACTIC ACID, PLASMA: Lactic Acid, Venous: 0.8 mmol/L (ref 0.5–1.9)

## 2019-11-06 LAB — APTT: aPTT: 85 seconds — ABNORMAL HIGH (ref 24–36)

## 2019-11-06 LAB — TROPONIN I (HIGH SENSITIVITY): Troponin I (High Sensitivity): 213 ng/L (ref <18)

## 2019-11-06 LAB — PHOSPHORUS: Phosphorus: 5 mg/dL — ABNORMAL HIGH (ref 2.5–4.6)

## 2019-11-06 LAB — PROTIME-INR
INR: 2 — ABNORMAL HIGH (ref 0.8–1.2)
Prothrombin Time: 22.3 seconds — ABNORMAL HIGH (ref 11.4–15.2)

## 2019-11-06 LAB — HEPARIN LEVEL (UNFRACTIONATED): Heparin Unfractionated: 1.42 IU/mL — ABNORMAL HIGH (ref 0.30–0.70)

## 2019-11-06 MED ORDER — VANCOMYCIN HCL 1750 MG/350ML IV SOLN
1750.0000 mg | Freq: Once | INTRAVENOUS | Status: AC
  Administered 2019-11-07: 1750 mg via INTRAVENOUS
  Filled 2019-11-06: qty 350

## 2019-11-06 MED ORDER — VANCOMYCIN HCL 1250 MG/250ML IV SOLN: 1250.0000 mg | INTRAVENOUS | Status: DC

## 2019-11-06 MED ORDER — PHENYLEPHRINE HCL-NACL 10-0.9 MG/250ML-% IV SOLN
INTRAVENOUS | Status: AC
  Filled 2019-11-06: qty 250

## 2019-11-06 MED ORDER — SODIUM CHLORIDE 0.9 % IV SOLN
2.0000 g | Freq: Every day | INTRAVENOUS | Status: DC
  Administered 2019-11-06 – 2019-11-07 (×2): 2 g via INTRAVENOUS
  Filled 2019-11-06 (×2): qty 2

## 2019-11-06 MED ORDER — ROCURONIUM BROMIDE 10 MG/ML (PF) SYRINGE
PREFILLED_SYRINGE | INTRAVENOUS | Status: AC
  Administered 2019-11-06: 50 mg
  Filled 2019-11-06: qty 10

## 2019-11-06 MED ORDER — MIDAZOLAM HCL 2 MG/2ML IJ SOLN
INTRAMUSCULAR | Status: AC
  Administered 2019-11-06: 2 mg
  Filled 2019-11-06: qty 2

## 2019-11-06 MED ORDER — PHENYLEPHRINE HCL-NACL 10-0.9 MG/250ML-% IV SOLN
0.0000 ug/min | INTRAVENOUS | Status: DC
  Administered 2019-11-06: 100 ug/min via INTRAVENOUS
  Filled 2019-11-06: qty 250

## 2019-11-06 MED ORDER — SODIUM BICARBONATE 8.4 % IV SOLN
INTRAVENOUS | Status: AC
  Administered 2019-11-06: 100 meq
  Filled 2019-11-06: qty 100

## 2019-11-06 MED ORDER — SODIUM ZIRCONIUM CYCLOSILICATE 10 G PO PACK
10.0000 g | PACK | Freq: Once | ORAL | Status: AC
  Administered 2019-11-06: 10 g via ORAL
  Filled 2019-11-06: qty 1

## 2019-11-06 NOTE — Progress Notes (Signed)
eLink Physician-Brief Progress Note Patient Name: Matthew Bates DOB: [birth year] MRN: [id number]   Date of Service  11/06/2019  HPI/Events of Note  New Subcutaneous emphysema in neck - portable CXR reveals Development of subcutaneous emphysema about the right greater than left supraclavicular soft tissues and right chest wall. Small left lateral pneumothorax, apical component difficult to assess given subcutaneous emphysema. Air adjacent to the inferior heart and continuous diaphragm consistent with pneumomediastinum. No visualized right pneumothorax, evaluation partially obscured by subcutaneous emphysema.  eICU Interventions  Likely needs at least L chest tube. Will ask ground team to evaluate at bedside.      Intervention Category Major Interventions: Other:  Trana Ressler Cornelia Copa 11/06/2019, 12:02 AM

## 2019-11-06 NOTE — Progress Notes (Signed)
eLink Physician-Brief Progress Note Patient Name: Matthew Bates DOB: Jun 08, 1946 MRN: [id number]   Date of Service  11/06/2019  HPI/Events of Note  Chest CT Scan w/o contrast reveals: 1. Small bilateral pneumothoraces. 2. Extensive pneumomediastinum extending from the thoracic inlet to the diaphragm. Circumferential air about the heart. Extrapleural air tracks into the anterior upper abdomen. 3. Extensive subcutaneous emphysema about the chest wall soft tissues and into the neck. 4. Moderate to severe diffuse bilateral airspace disease with ground-glass and consolidative opacities, consistent with COVID-19 pneumonia. Small right and trace left pleural effusions.   eICU Interventions  Will notify ground team that study has been completed as they ordered it.      Intervention Category Major Interventions: Other:  Lysle Dingwall 11/06/2019, 5:41 AM

## 2019-11-06 NOTE — Procedures (Signed)
Chest Tube Insertion Procedure Note  Indications:  Clinically significant Pneumothorax  Pre-operative Diagnosis: Pneumothorax  Post-operative Diagnosis: Pneumothorax  Procedure Details  Informed consent was obtained for the procedure, including sedation.  Risks of lung perforation, hemorrhage, arrhythmia, and adverse drug reaction were discussed.  This was obtaine orally from patient wife over phone. Patient has covid-19  After sterile skin prep, using standard technique, a 20 French tube was placed in the right anterior axillary line  4th rib space.  Findings: None  Estimated Blood Loss:  Minimal         Specimens:  None              Complications:  None; patient tolerated the procedure well.         Disposition: ICU - intubated and hemodynamically stable.         Condition: stable  Attending Attestation: I performed the procedure.  Heparin was on hold for few hours pre procedure. Per procedure there was ooze of blood from subuct space. So, This was tamponaded with gauze for 5 minutes and bleeding stopped. No blood in chest tube. Will start heparin gtt after a fe whours   SIGNATURE    Dr. Brand Males, M.D., F.C.C.P,  Pulmonary and Critical Care Medicine Staff Physician, [place] Director - Interstitial Lung Disease  Program  Pulmonary [place] [facility] [place], [place], [address]  Pager: [phone], If no answer or between  15:00h - 7:00h: call [phone] Telephone: [phone]  3:14 PM 11/06/2019

## 2019-11-06 NOTE — Procedures (Signed)
Chest Tube Insertion Procedure Note  Indications:  Clinically significant Pneumothorax  Pre-operative Diagnosis: Pneumothorax  Post-operative Diagnosis: Pneumothorax  Procedure Details  Informed consent was obtained for the procedure, including sedation.  Risks of lung perforation, hemorrhage, arrhythmia, and adverse drug reaction were discussed.   After sterile skin prep, using standard technique, a Small bore pigtail chest tube was placed in the left anterior axillary line in the 4 rib space.  Findings: None  Estimated Blood Loss:  Minimal         Specimens:  None              Complications:  None; patient tolerated the procedure well.         Disposition: ICU - intubated and critically ill.         Condition: stable  Attending Attestation:   IV heparin drip help post procedure   Johnsie Cancel, NP-C [place] [facility] Contact / Pager information can be found on Amion  11/06/2019, 4:46 PM

## 2019-11-06 NOTE — Progress Notes (Signed)
eLink Physician-Brief Progress Note Patient Name: Matthew Bates DOB: August 08, 1946 MRN: [id number]   Date of Service  11/06/2019  HPI/Events of Note  Tracheal aspirate gram stain reveals MODERATE WBC PRESENT, PREDOMINANTLY PMN and RARE GRAM POSITIVE COCCI. PCT  = 0.44. Will start HCAP coverage.   eICU Interventions  Will order: 1. Vancomycin and Cefepime per pharmacy consultation.      Intervention Category Major Interventions: Infection - evaluation and management  Aaminah Forrester Eugene 11/06/2019, 11:27 PM

## 2019-11-06 NOTE — Progress Notes (Signed)
ANTICOAGULATION CONSULT NOTE - Follow Up Consult  Pharmacy Consult for Heparin Indication: pulmonary embolus  Allergies  Allergen Reactions  . Metoclopramide Hcl Hypertension    Reglan=Increases HBP symptoms    Patient Measurements: Height: 5\' 9"  (175.3 cm) Weight: 184 lb 4.9 oz (83.6 kg) IBW/kg (Calculated) : 70.7 Heparin Dosing Weight: 82.8 kg  Vital Signs: Temp: 97.9 F (36.6 C) (01/02 0753) Temp Source: Axillary (01/02 0753) BP: 173/72 (01/02 0800) Pulse Rate: 89 (01/02 0800)  Labs: Recent Labs    11/04/19 0449 11/04/19 0450 11/04/19 0742 11/04/19 1630 11/05/19 0340 11/05/19 0438 11/05/19 1241 11/06/19 0408  HGB  --  8.3*   < >  --   --  8.6* 9.2* 8.7*  HCT  --  28.0*   < >  --   --  29.9* 27.0* 31.3*  PLT  --  97*  --   --   --  154  --  188  APTT  --   --   --  67* 67*  --   --  85*  LABPROT  --   --   --   --   --   --   --  22.3*  INR  --   --   --   --   --   --   --  2.0*  HEPARINUNFRC  --  1.50*  --   --  1.56*  --   --  1.42*  CREATININE 3.46*  --   --   --  3.21*  --   --  3.20*  CKTOTAL  --   --   --   --   --   --   --  102  CKMB  --   --   --   --   --   --   --  3.4   < > = values in this interval not displayed.    Estimated Creatinine Clearance: 20.3 mL/min (A) (by C-G formula based on SCr of 3.2 mg/dL (H)).   Assessment: AM aPTT the same at 67 sec (low end of therapeutic range).  No overt bleeding or complications noted. Increased heparin to 1000 units to see if we could get in the middle to high aPTT range due to COVID and the level this AM was 85. Would continue on this rate, but the nurse called and said the team is holding heparin for now in anticipation of placing a chest tube. We will follow for restart, will likely need a bolus and can restart at 1000 units/hr. Heparin level still falsely elevated at 1.42 due to eliquis PTA  Goal of Therapy:  aPTT 66-102 seconds Monitor platelets by anticoagulation protocol: Yes   Plan:  Hold  heparin follow for restart  Nicoletta Dress, PharmD PGY2 Infectious Disease Pharmacy Resident   11/06/2019 10:00 AM

## 2019-11-06 NOTE — Significant Event (Signed)
PCCM Overnight Called by Elink to evaluate patient for chest tube placement  On my evaluation: Continues on Fentanyl @ 150 Pt is hemodynamically stable-good waveform on arterial line O2 sats >91% Peak pressure 32 and Plateau 22  Pt has some subcutaneous emphysema on the left side Continues on mechanical vent with PEEP 10  Received a CXR: per radiology read>> subcutaneous emphysema about the right greater than left supraclavicular soft tissues and right chest wall. Very small left lateral pneumothorax, apical component difficult to assess given subcutaneous emphysema. Pneumomediastinum with air cell abutting the inferior heart.  Plan: - CT chest w/o contrast to better evaluate  Signed Dr Seward Carol Pulmonary Critical Care Locums

## 2019-11-06 NOTE — Progress Notes (Signed)
CRITICAL VALUE ALERT  Critical Value:  Troponin 213  Date & Time Notied:  11/06/19 @ 2136  Provider Notified: Warren Lacy

## 2019-11-06 NOTE — Progress Notes (Signed)
ANTICOAGULATION CONSULT NOTE - Follow Up Consult  Pharmacy Consult for Heparin Indication: pulmonary embolus  Allergies  Allergen Reactions  . Metoclopramide Hcl Hypertension    Reglan=Increases HBP symptoms    Patient Measurements: Height: 5\' 9"  (175.3 cm) Weight: 184 lb 4.9 oz (83.6 kg) IBW/kg (Calculated) : 70.7 Heparin Dosing Weight: 82.8 kg  Vital Signs: Temp: 98 F (36.7 C) (01/02 1143) Temp Source: Axillary (01/02 1143) BP: 113/48 (01/02 1700) Pulse Rate: 83 (01/02 1700)  Labs: Recent Labs    11/04/19 0449 11/04/19 0450 11/04/19 0742 11/04/19 1630 11/05/19 0340 11/05/19 0438 11/05/19 1241 11/06/19 0408  HGB  --  8.3*   < >  --   --  8.6* 9.2* 8.7*  HCT  --  28.0*   < >  --   --  29.9* 27.0* 31.3*  PLT  --  97*  --   --   --  154  --  188  APTT  --   --   --  67* 67*  --   --  85*  LABPROT  --   --   --   --   --   --   --  22.3*  INR  --   --   --   --   --   --   --  2.0*  HEPARINUNFRC  --  1.50*  --   --  1.56*  --   --  1.42*  CREATININE 3.46*  --   --   --  3.21*  --   --  3.20*  CKTOTAL  --   --   --   --   --   --   --  102  CKMB  --   --   --   --   --   --   --  3.4   < > = values in this interval not displayed.    Estimated Creatinine Clearance: 20.3 mL/min (A) (by C-G formula based on SCr of 3.2 mg/dL (H)).   Assessment: 74 yo male with PE.  Heparin was on hold for chest tube placement to to be resumed. Using aPTTs for monitoring with recent apixaban   Goal of Therapy:  aPTT 66-102 seconds Monitor platelets by anticoagulation protocol: Yes   Plan:  -restart heparin at 1000 units/hr -aPTT in 8 hours -daily aPTT, heparin level and CBC  Hildred Laser, PharmD Clinical Pharmacist **Pharmacist phone directory can now be found on [email or web address] ([email or web address]).  Listed under [place].

## 2019-11-06 NOTE — Progress Notes (Addendum)
Pharmacy Antibiotic Note  Matthew Bates is a 74 y.o. male with possible HCAP.  Pharmacy has been consulted for Vancomycin and Cefepime   Plan: Vancomycin 1750 mg IV now, then 1250 mg IV q48h Est AUC 487 Cefepime 2 g IV q24h  Height: 5\' 9"  (175.3 cm) Weight: 184 lb 4.9 oz (83.6 kg) IBW/kg (Calculated) : 70.7  Temp (24hrs), Avg:98.5 F (36.9 C), Min:97.9 F (36.6 C), Max:99.2 F (37.3 C)  Recent Labs  Lab 10/31/19 0007 10/31/19 0238 10/31/19 2306 11/01/19 1227 11/01/19 1645 11/02/19 0335 11/02/19 1749 11/03/19 0208 11/04/19 0449 11/04/19 0450 11/05/19 0340 11/05/19 0438 11/05/19 2341 11/06/19 0408  WBC 11.9*  --   --   --   --  9.4  --  11.0*  --  9.0  --  11.1*  --  11.4*  CREATININE 3.30*  --   --   --   --  3.72* 3.54* 3.46* 3.46*  --  3.21*  --   --  3.20*  LATICACIDVEN 2.8* 2.0*  --  1.7 1.5  --   --   --   --   --   --   --  0.8  --   VANCORANDOM  --   --  15  --   --   --   --   --   --   --   --   --   --   --     Estimated Creatinine Clearance: 20.3 mL/min (A) (by C-G formula based on SCr of 3.2 mg/dL (H)).    Allergies  Allergen Reactions  . Metoclopramide Hcl Hypertension    Reglan=Increases HBP symptoms    Caryl Pina 11/06/2019 11:33 PM

## 2019-11-06 NOTE — Progress Notes (Signed)
NAME:  Matthew Bates, MRN:  [id number], DOB:  September 12, 1946, LOS: 7 ADMISSION DATE:  10/27/2019, CONSULTATION DATE:  10/31/19 CHIEF COMPLAINT:  Cardiac arrest  Brief History   74 yo male was discharged from [facility] on 12/19 after treatment for COVID 19 pneumonia (received remdesivir and steroids), and DVT with presumed PE and sent home on eliquis.  Developed progressive dyspnea with hypoxia and returned to ER on 12/26, and intubated by EMS prior to arrival.  Developed bradycardia with PEA, then VT requiring defibrillation and started on amiodarone.  Past Medical History  CAD Cataract CKD DM GERD GOUT Hepatitis High triglycerides HTN Obesity  Significant Hospital Events   12/05 to 12/19 Admit to [facility] with covid 12/26 Admit to [facility] after PEA/VT cardiac arrest with hypoxia and VDRF. ECHO 12/6  - lvef 45% and repeat 12/27 - similar 12/29 multiple runs of VT, amio stopped 12/31 - Soft BP after lasix yesterday requiring low dose levo.Vent dysynchrony requiring increased sedation overnight. 1/1 -  On vent. Off pededex, Off levophed gtt. Remains on fent gtt and on IV heparin gtt. Increased fio2 need last night to 60% (now 50%) and peep 10.  T max 99/58F. WBC 10-11k. PCT coming down through 11/03/2019. AKI better - creat 3.2 but Na worse 153 and K 5.0  Consults:    Procedures:  ETT 12/26 >>  Rt femoral CVL 12/26 >>  Needs change 1/2 or 1/3 to IJ/subclav Rt radial a line 12/27 >>   Significant Diagnostic Tests:  12/11 B/l LE venous doppler: Right: Findings consistent with acute deep vein thrombosis involving the right posterior tibial veins, and right peroneal veins. Left: Findings consistent with acute deep vein thrombosis involving the left soleal veins, and left gastrocnemius veins. No cystic structure found in the popliteal fossa.  CT head 12/27 >> no acute findings Echo 12/27 >> LVEF 45-50%, RV with reduced systolic function and enlargement, LA dilated  Micro Data:  Blood 12/26  >>ngtd Blood 12/27: ngtd Urine 12/27: neg Sputum 12/26 >> not obtained  Antimicrobials:  Vancomycin 12/26 >>12/29 Cefepime 12/26 >>  1/1 Zosyn 12/26 > 12/27  Antibiotics Given (last 72 hours)    Date/Time Action Medication Dose Rate   11/04/19 0413 New Bag/Given   ceFEPIme (MAXIPIME) 2 g in sodium chloride 0.9 % 100 mL IVPB 2 g 200 mL/hr   11/05/19 0350 New Bag/Given   ceFEPIme (MAXIPIME) 2 g in sodium chloride 0.9 % 100 mL IVPB 2 g 200 mL/hr     Anti-infectives (From admission, onward)   Start     Dose/Rate Route Frequency Ordered Stop   11/02/19 1400  vancomycin (VANCOREADY) IVPB 750 mg/150 mL  Status:  Discontinued     750 mg 150 mL/hr over 60 Minutes Intravenous Every 36 hours 11/01/19 0946 11/02/19 0953   11/01/19 0200  vancomycin (VANCOREADY) IVPB 750 mg/150 mL     750 mg 150 mL/hr over 60 Minutes Intravenous  Once 10/31/19 2343 11/01/19 0302   10/31/19 0400  piperacillin-tazobactam (ZOSYN) IVPB 3.375 g  Status:  Discontinued     3.375 g 12.5 mL/hr over 240 Minutes Intravenous Every 8 hours 10/31/19 0031 10/31/19 0120   10/31/19 0400  ceFEPIme (MAXIPIME) 1 g in sodium chloride 0.9 % 100 mL IVPB  Status:  Discontinued     1 g 200 mL/hr over 30 Minutes Intravenous Every 24 hours 10/31/19 0120 10/31/19 0121   10/31/19 0400  ceFEPIme (MAXIPIME) 2 g in sodium chloride 0.9 % 100 mL IVPB  2 g 200 mL/hr over 30 Minutes Intravenous Every 24 hours 10/31/19 0121 11/05/19 0420   10/31/19 0046  vancomycin variable dose per unstable renal function (pharmacist dosing)  Status:  Discontinued      Does not apply See admin instructions 10/31/19 0050 11/02/19 0953   10/31/19 0045  vancomycin (VANCOREADY) IVPB 1500 mg/300 mL     1,500 mg 150 mL/hr over 120 Minutes Intravenous  Once 10/31/19 0031 10/31/19 0259   10/07/2019 0830  piperacillin-tazobactam (ZOSYN) IVPB 3.375 g  Status:  Discontinued     3.375 g 12.5 mL/hr over 240 Minutes Intravenous Every 8 hours 11/04/2019 0818 10/31/19 0002        Interim history/subjective:    11/06/2019 -overnight developed subcutaneous air and CT scan of the chest is revealed small bilateral pneumothoraces with bilateral ARDS consistent with Covid.  He continues to be normotensive.  FiO2 40% with a PEEP of 10.  He is on a fentanyl drip and a heparin drip.  His Precedex is off.  He is getting free water.  Objective   Blood pressure (!) 173/72, pulse 89, temperature 98.6 F (37 C), temperature source Oral, resp. rate (!) 22, height 5\' 9"  (1.753 m), weight 83.6 kg, SpO2 91 %.    Vent Mode: PRVC FiO2 (%):  [40 %-50 %] 40 % Set Rate:  [32 bmp] 32 bmp Vt Set:  [420 mL] 420 mL PEEP:  [10 cmH20] 10 cmH20 Plateau Pressure:  [20 cmH20-27 cmH20] 24 cmH20   Intake/Output Summary (Last 24 hours) at 11/06/2019 [id number] Last data filed at 11/06/2019 0800 Gross per 24 hour  Intake 2331.61 ml  Output 1630 ml  Net 701.61 ml   Filed Weights   11/04/19 0426 11/05/19 0100 11/06/19 0200  Weight: 82.8 kg 82.2 kg 83.6 kg  General Appearance:  Looks criticall ill OBESE - no Head:  Normocephalic, without obvious abnormality, atraumatic Eyes:  PERRL - yes, conjunctiva/corneas - muddy     Ears:  Normal external ear canals, both ears Nose:  G tube - no Throat:  ETT TUBE - yes , OG tube - yes Neck:  Supple,  No enlargement/tenderness/nodules Lungs: Clear to auscultation bilaterally, Ventilator   Synchrony - yes but mild dysnchrony + Heart:  S1 and S2 normal, no murmur, CVP - no.  Pressors - no Abdomen:  Soft, no masses, no organomegaly. Rt groin femoral CVL + (12/26) Genitalia / Rectal:  Not done Extremities:  Extremities-intact .  Skin:  ntact in exposed areas . Sacral area - not examined Neurologic:  Sedation - fent gtt -> RASS - -3 . Moves all 4s - yes. CAM-ICU -  Cannot test. Orientation - cannot test     RESOLVED   Shock - resolved.  Hypotension at this point likely sedation + effects of lasix. - Continue low dose levo as needed for goal MAP > 65 -  cont supportive care    Assessment & Plan:   Acute on chronic (4L since last d/c) hypoxic,hypercapnic respiratory after recent treatment for COVID 19 pneumonia. Suspected PE - new echo c/w RHS with suppressive EF along with + DVT's Small bilateral PTx 11/06/2019  11/06/2019 - > does not meet criteria for SBT/Extubation in setting of Acute Respiratory Failure due to ARDS and now small bialteral ptx  Plan - place bilateral chest tube .11/06/2019 - full vent support - VAP bundele    - ARDS Measure  - Target TVol 6-8cc/kgIBW  - Target Plateau Pressure < 30cm H20  - Target driving  pressure less than 15 cm of water  - Target PaO2 55-65: titrate PEEP/FiO2 per protocol  - As long as PaO2 to FiO2 ratio is less than 1:150 position in prone position for 16 hours a day   COVID -19 - s/p remdesivir  11/06/2019 - on decadron  Plan  =- stop decadron (Was acute covid admission nearly a month ago)    Concern for HCAP  11/06/2019  -  PCT 0.44 - fever curev better  Plan  - last day abx 11/06/2019 - await  sputum culture 11/05/2019 given increased fio2 needs  - rechck PCT    PEA/VT arrest >> likely from acute hypoxic respiratory failure. Acute on chronic systolic CHF. Hx of CAD s/p CABG, HTN, Hypertriglyceridemia. - on fenofibrate and statin Persistent afib with rvr - resolved RVR.  Amio stopped 12/29 due to VT At increased risk QTc - 446msec on 11/03/2019  11/06/2019 - stable on IV heparin gttn. CK normal  Plan - continue ASA, crestor, fenofibrate, heparin gtt   B/l lower leg DVT's. renal function precludes CT angiogram chest at this time (however echo supportive of acute PE and has + DVTs)  Plan - continue heparin gtt per pharm (hold for chest tube)    Thrombocytopenia: 114->125-->86 > 82 throuhg 12/30 and improved while on heparin. Picture not c/w HITT. Probably medical illness related. Normalized 11/05/2019  plan -cont heparin gtt for now  - await  hitt. Heparin ab sent [id number]    Acute metabolic encephalopathy from hypoxia, sepsis, renal failure. 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 Rise stopped  Plan  - monitor    hyperntremia  - 11/06/2019 worse  plan - Continue free water per tube - Hold further lasix  DM type II with hyperglycemia -SSI, novolog  Pressure ulcers. - Stage 2 Rt sacrum, present prior to this admission  Best practice:  Diet: tube feeds  DVT prophylaxis: Heparin drip   GI prophylaxis: protonix  Mobility: bed rest  Code Status: Full code  Disposition: ICU  Family communication: wife updated 11/06/2019 about pneumothorax. - consented for chest tube (will need to stop heparin). Wife wanted to know if we should give plasma or remdesivir - answered in the negative. Consented wife for CVL. Wfe says 2 night ago a male nurse took care of patient -> and inceased sedation . She thinks that this made patient need increased vent settings . So she does not want "that male nurse" taking care of the patient. I answered that sedation typically helps reduce ventilator demand. She verbalized understand but still said she does not want that RN taking care of the patient.      ATTESTATION & SIGNATURE   The patient Matthew Bates is critically ill with multiple organ systems failure and requires high complexity decision making for assessment and support, frequent evaluation and titration of therapies, application of advanced monitoring technologies and extensive interpretation of multiple databases.   Critical Care Time devoted to patient care services described in this note is  30  Minutes. This time reflects time of care of this signee Dr Brand Males. This critical care time does  not reflect procedure time, or teaching time or supervisory time of PA/NP/Med student/Med  Resident etc but could involve care discussion time     Dr. Brand Males, M.D., [facility].C.P Pulmonary and Critical Care Medicine Staff Physician [place] [facility] Pager: [phone], If no answer or between  15:00h - 7:00h: call [phone]  11/06/2019 9:04 AM    LABS    PULMONARY Recent Labs  Lab 11/01/19 0355 11/02/19 0338 11/03/19 0258 11/05/19 0148 11/05/19 1241  PHART 7.342* 7.304* 7.236* 7.315* 7.280*  PCO2ART 42.7 52.6* 67.5* 54.9* 58.6*  PO2ART 78.0* 86.0 92.0 69.0* 84.0  HCO3 23.2 26.1 28.7* 28.0 27.4  TCO2 24 28 31 30 29   O2SAT 95.0 95.0 95.0 92.0 94.0    CBC Recent Labs  Lab 11/04/19 0450 11/05/19 0438 11/05/19 1241 11/06/19 0408  HGB 8.3* 8.6* 9.2* 8.7*  HCT 28.0* 29.9* 27.0* 31.3*  WBC 9.0 11.1*  --  11.4*  PLT 97* 154  --  188    COAGULATION Recent Labs  Lab 10/31/19 0007 11/06/19 0408  INR 3.9* 2.0*    CARDIAC  No results for input(s): TROPONINI in the last 168 hours. No results for input(s): PROBNP in the last 168 hours.   CHEMISTRY Recent Labs  Lab 11/02/19 0335 11/02/19 1749 11/03/19 0208 11/04/19 0449 11/05/19 0148 11/05/19 0340 11/05/19 1241 11/06/19 0408  NA 146* 148* 150* 153* 152* 153* 153* 153*  K 4.4 4.5 4.7 4.6 4.8 5.0 5.2* 5.3*  CL 109 113* 114* 114*  --  115*  --  116*  CO2 24 23 27 24   --  26  --  26  GLUCOSE 240* 290* 146* 190*  --  176*  --  293*  BUN 96* 97* 97* 98*  --  108*  --  130*  CREATININE 3.72* 3.54* 3.46* 3.46*  --  3.21*  --  3.20*  CALCIUM 7.3* 7.3* 7.4* 7.5*  --  7.7*  --  7.8*  MG 2.3 2.3 2.4 2.3  --  2.5*  --  2.7*  PHOS 5.2*  --  4.8* 4.6  --  5.4*  --  5.0*   Estimated Creatinine Clearance: 20.3 mL/min (A) (by C-G formula based on SCr of 3.2 mg/dL (H)).   LIVER Recent Labs  Lab 10/31/19 0007 11/01/19 0316 11/02/19 0335 11/03/19 0208 11/06/19 0408   AST 111* 43* 36 37 21  ALT 56* 41 38 34 24  ALKPHOS 98 84 89 96 75  BILITOT 1.4* 0.8 0.8 0.6 0.7  PROT 5.9* 5.2* 5.3* 5.8* 5.9*  ALBUMIN 2.1* 1.7* 1.8* 1.9* 1.9*  INR 3.9*  --   --   --  2.0*     INFECTIOUS Recent Labs  Lab 11/01/19 1227 11/01/19 1645 11/03/19 0208 11/05/19 1212 11/05/19 2341 11/06/19 0408  LATICACIDVEN 1.7 1.5  --   --  0.8  --   PROCALCITON 1.77  --  0.86 0.34  --  0.44     ENDOCRINE CBG (last 3)  Recent Labs    11/05/19 2332 11/06/19 0407 11/06/19 0806  GLUCAP 256* 250* 252*         IMAGING x48h  - image(s) personally visualized  -   highlighted in bold CT CHEST WO CONTRAST  Result Date: 11/06/2019 CLINICAL DATA:  Pneumothorax and pneumomediastinum. EXAM: CT CHEST WITHOUT CONTRAST TECHNIQUE: Multidetector CT imaging of the chest was performed following the standard protocol without IV contrast. COMPARISON:  Radiograph yesterday. FINDINGS: Cardiovascular: Aortic atherosclerosis. No aortic aneurysm. There are coronary artery calcifications. Heart is normal  in size. No pericardial effusion. Mediastinum/Nodes: Endotracheal and enteric tubes in place. Extensive pneumomediastinum. Pneumomediastinum extends from the thoracic inlet to the diaphragm. Circumferential air about the heart. No obvious adenopathy. No thyroid nodule. Lungs/Pleura: Small bilateral pneumothoraces. Diffuse bilateral lung opacities with patchy ground-glass throughout both lungs, consolidative peripheral opacities in both lower lobes and dependent right upper lobe. Central air bronchograms. Small right and trace left pleural effusion. Upper Abdomen: Extrapleural air tracks anteriorly in the upper abdomen. Musculoskeletal: Extensive subcutaneous emphysema throughout the right greater than left chest wall. Extensive air tracks into the soft tissues of the neck and supraclavicular regions. Soft tissue emphysema tracks in the anterior abdominal wall, more so on the left. Median sternotomy. No  acute osseous abnormality. Multilevel degenerative change in the spine. IMPRESSION: 1. Small bilateral pneumothoraces. 2. Extensive pneumomediastinum extending from the thoracic inlet to the diaphragm. Circumferential air about the heart. Extrapleural air tracks into the anterior upper abdomen. 3. Extensive subcutaneous emphysema about the chest wall soft tissues and into the neck. 4. Moderate to severe diffuse bilateral airspace disease with ground-glass and consolidative opacities, consistent with COVID-19 pneumonia. Small right and trace left pleural effusions. Aortic Atherosclerosis (ICD10-I70.0). Electronically Signed   By: Keith Rake M.D.   On: 11/06/2019 04:07   DG CHEST PORT 1 VIEW  Result Date: 11/06/2019 CLINICAL DATA:  Endotracheal tube. EXAM: PORTABLE CHEST 1 VIEW COMPARISON:  November 05, 2019. FINDINGS: Stable cardiomediastinal silhouette. Endotracheal and nasogastric tubes are unchanged in position. Stable minimal bilateral pneumothoraces are noted. Stable pneumomediastinum is noted. Stable bilateral lung opacities are noted concerning for multifocal pneumonia. Stable subcutaneous emphysema is seen bilaterally. Bony thorax is unremarkable. IMPRESSION: Stable support apparatus. Stable minimal bilateral pneumothoraces. Stable pneumomediastinum. Stable bilateral lung opacities are noted concerning for multifocal pneumonia. Stable subcutaneous emphysema. Electronically Signed   By: Marijo Conception M.D.   On: 11/06/2019 08:10   DG CHEST PORT 1 VIEW  Addendum Date: 11/06/2019   ADDENDUM REPORT: 11/06/2019 00:14 ADDENDUM: Critical Value/emergent results were called by telephone at the time of interpretation on 11/06/2019 at 12:14 am to Dr Oletta Darter, who verbally acknowledged these results. Electronically Signed   By: Keith Rake M.D.   On: 11/06/2019 00:14   Result Date: 11/06/2019 CLINICAL DATA:  Subcutaneous air. COVID positive. EXAM: PORTABLE CHEST 1 VIEW COMPARISON:  Radiograph earlier this  day FINDINGS: Development of subcutaneous emphysema about the right greater than left supraclavicular soft tissues and right chest wall. Small left lateral pneumothorax, apical component difficult to assess given subcutaneous emphysema. Air adjacent to the inferior heart and continuous diaphragm consistent with pneumomediastinum. No visualized right pneumothorax, evaluation partially obscured by subcutaneous emphysema. Endotracheal and enteric tubes remain in place. Diffuse bilateral heterogeneous airspace disease. Post median sternotomy with unchanged mild cardiomegaly. IMPRESSION: 1. Development of subcutaneous emphysema about the right greater than left supraclavicular soft tissues and right chest wall. Small left lateral pneumothorax, apical component difficult to assess given subcutaneous emphysema. 2. Pneumomediastinum with air cell abutting the inferior heart. 3. Grossly unchanged bilateral airspace disease consistent with COVID pneumonia. Electronically Signed: By: Keith Rake M.D. On: 11/05/2019 23:43   DG Chest Port 1 View AM  Result Date: 11/05/2019 CLINICAL DATA:  Shortness of breath. Hypoxia.  COVID-19 positive. EXAM: PORTABLE CHEST 1 VIEW COMPARISON:  Radiograph 11/03/2019, additional priors. FINDINGS: Endotracheal tube tip 5.1 cm from the carina. Enteric tube tip and side-port below the diaphragm. Post median sternotomy and CABG. Upper normal heart size, unchanged. Diffuse bilateral heterogeneous opacities have not  significantly changed from prior exam. No pneumothorax. No large pleural effusion. IMPRESSION: 1. Diffuse bilateral airspace disease consistent with COVID-19 pneumonia. No significant change from prior exam. 2. Stable support apparatus. Electronically Signed   By: Keith Rake M.D.   On: 11/05/2019 03:21

## 2019-11-07 ENCOUNTER — Inpatient Hospital Stay (HOSPITAL_COMMUNITY): Payer: Medicare HMO

## 2019-11-07 LAB — GLUCOSE, CAPILLARY
Glucose-Capillary: 211 mg/dL — ABNORMAL HIGH (ref 70–99)
Glucose-Capillary: 227 mg/dL — ABNORMAL HIGH (ref 70–99)
Glucose-Capillary: 240 mg/dL — ABNORMAL HIGH (ref 70–99)
Glucose-Capillary: 243 mg/dL — ABNORMAL HIGH (ref 70–99)
Glucose-Capillary: 269 mg/dL — ABNORMAL HIGH (ref 70–99)
Glucose-Capillary: 288 mg/dL — ABNORMAL HIGH (ref 70–99)

## 2019-11-07 LAB — POCT I-STAT 7, (LYTES, BLD GAS, ICA,H+H)
Acid-Base Excess: 4 mmol/L — ABNORMAL HIGH (ref 0.0–2.0)
Bicarbonate: 31.7 mmol/L — ABNORMAL HIGH (ref 20.0–28.0)
Calcium, Ion: 1.19 mmol/L (ref 1.15–1.40)
HCT: 24 % — ABNORMAL LOW (ref 39.0–52.0)
Hemoglobin: 8.2 g/dL — ABNORMAL LOW (ref 13.0–17.0)
O2 Saturation: 96 %
Patient temperature: 99
Potassium: 5.5 mmol/L — ABNORMAL HIGH (ref 3.5–5.1)
Sodium: 155 mmol/L — ABNORMAL HIGH (ref 135–145)
TCO2: 34 mmol/L — ABNORMAL HIGH (ref 22–32)
pCO2 arterial: 71.5 mmHg (ref 32.0–48.0)
pH, Arterial: 7.256 — ABNORMAL LOW (ref 7.350–7.450)
pO2, Arterial: 98 mmHg (ref 83.0–108.0)

## 2019-11-07 LAB — BASIC METABOLIC PANEL
Anion gap: 9 (ref 5–15)
BUN: 140 mg/dL — ABNORMAL HIGH (ref 8–23)
CO2: 29 mmol/L (ref 22–32)
Calcium: 7.8 mg/dL — ABNORMAL LOW (ref 8.9–10.3)
Chloride: 116 mmol/L — ABNORMAL HIGH (ref 98–111)
Creatinine, Ser: 3.12 mg/dL — ABNORMAL HIGH (ref 0.61–1.24)
GFR calc Af Amer: 22 mL/min — ABNORMAL LOW (ref >60)
GFR calc non Af Amer: 19 mL/min — ABNORMAL LOW (ref >60)
Glucose, Bld: 263 mg/dL — ABNORMAL HIGH (ref 70–99)
Potassium: 5.3 mmol/L — ABNORMAL HIGH (ref 3.5–5.1)
Sodium: 154 mmol/L — ABNORMAL HIGH (ref 135–145)

## 2019-11-07 LAB — CBC
HCT: 28.6 % — ABNORMAL LOW (ref 39.0–52.0)
Hemoglobin: 7.7 g/dL — ABNORMAL LOW (ref 13.0–17.0)
MCH: 24.9 pg — ABNORMAL LOW (ref 26.0–34.0)
MCHC: 26.9 g/dL — ABNORMAL LOW (ref 30.0–36.0)
MCV: 92.6 fL (ref 80.0–100.0)
Platelets: 206 10*3/uL (ref 150–400)
RBC: 3.09 MIL/uL — ABNORMAL LOW (ref 4.22–5.81)
RDW: 20.6 % — ABNORMAL HIGH (ref 11.5–15.5)
WBC: 7.1 10*3/uL (ref 4.0–10.5)
nRBC: 0.8 % — ABNORMAL HIGH (ref 0.0–0.2)

## 2019-11-07 LAB — PHOSPHORUS: Phosphorus: 3.5 mg/dL (ref 2.5–4.6)

## 2019-11-07 LAB — MAGNESIUM: Magnesium: 2.5 mg/dL — ABNORMAL HIGH (ref 1.7–2.4)

## 2019-11-07 LAB — APTT
aPTT: 61 seconds — ABNORMAL HIGH (ref 24–36)
aPTT: 53 seconds — ABNORMAL HIGH (ref 24–36)

## 2019-11-07 LAB — TRIGLYCERIDES: Triglycerides: 150 mg/dL — ABNORMAL HIGH (ref <150)

## 2019-11-07 LAB — HEPARIN LEVEL (UNFRACTIONATED): Heparin Unfractionated: 0.93 IU/mL — ABNORMAL HIGH (ref 0.30–0.70)

## 2019-11-07 LAB — PROCALCITONIN: Procalcitonin: 0.5 ng/mL

## 2019-11-07 MED ORDER — MIDAZOLAM HCL 2 MG/2ML IJ SOLN: 1.0000 mg | INTRAMUSCULAR | Status: DC | PRN

## 2019-11-07 MED ORDER — LACTATED RINGERS IV BOLUS: 1000.0000 mL | Freq: Once | INTRAVENOUS | Status: DC

## 2019-11-07 MED ORDER — FREE WATER
300.0000 mL | Status: DC
  Administered 2019-11-07 – 2019-11-08 (×9): 300 mL

## 2019-11-07 MED ORDER — HEPARIN (PORCINE) 25000 UT/250ML-% IV SOLN
1250.0000 [IU]/h | INTRAVENOUS | Status: DC
  Administered 2019-11-07: 1250 [IU]/h via INTRAVENOUS
  Filled 2019-11-07: qty 250

## 2019-11-07 MED ORDER — MIDAZOLAM 50MG/50ML (1MG/ML) PREMIX INFUSION
0.5000 mg/h | INTRAVENOUS | Status: DC
  Administered 2019-11-07: 1 mg/h via INTRAVENOUS
  Filled 2019-11-07: qty 50

## 2019-11-07 NOTE — Progress Notes (Signed)
ANTICOAGULATION CONSULT NOTE - Follow Up Consult  Pharmacy Consult for Heparin Indication: pulmonary embolus  Allergies  Allergen Reactions  . Metoclopramide Hcl Hypertension    Reglan=Increases HBP symptoms    Patient Measurements: Height: 5\' 9"  (175.3 cm) Weight: 188 lb 15 oz (85.7 kg) IBW/kg (Calculated) : 70.7 Heparin Dosing Weight: 82.8 kg  Vital Signs: Temp: 99.3 F (37.4 C) (01/03 1552) Temp Source: Axillary (01/03 1552) BP: 114/48 (01/03 2000) Pulse Rate: 91 (01/03 2000)  Labs: Recent Labs    11/05/19 0148 11/05/19 0340 11/05/19 0438 11/06/19 0408 11/06/19 2049 11/06/19 2350 11/07/19 0329 11/07/19 1009 11/07/19 1025  HGB  --   --  8.6* 8.7*  --   --  7.7* 8.2*  --   HCT  --   --  29.9* 31.3*  --   --  28.6* 24.0*  --   PLT  --   --  154 188  --   --  206  --   --   APTT   < > 67*  --  85*  --  61*  --   --  53*  LABPROT  --   --   --  22.3*  --   --   --   --   --   INR  --   --   --  2.0*  --   --   --   --   --   HEPARINUNFRC  --  1.56*  --  1.42*  --  0.93*  --   --   --   CREATININE  --  3.21*  --  3.20*  --   --  3.12*  --   --   CKTOTAL  --   --   --  102  --   --   --   --   --   CKMB  --   --   --  3.4  --   --   --   --   --   TROPONINIHS  --   --   --   --  213*  --   --   --   --    < > = values in this interval not displayed.    Estimated Creatinine Clearance: 22.5 mL/min (A) (by C-G formula based on SCr of 3.12 mg/dL (H)).   Assessment: 74 yo male with PE.  Heparin was on hold for chest tube placement to to be resumed. Using aPTTs for monitoring with recent apixaban  Heparin was held for mental status changes. Head CT was negative and heparin was restarted  Goal of Therapy:  aPTT 66-102 seconds Monitor platelets by anticoagulation protocol: Yes   Plan:  -Heparin restarted at 1250 units/hr -Heparin level and CBC in am  Hildred Laser, PharmD Clinical Pharmacist **Pharmacist phone directory can now be found on [email or web address] ([email or web address]).   Listed under [place].

## 2019-11-07 NOTE — Progress Notes (Signed)
Assisted tele visit to patient with family member.  Margaretmary Prisk M, RN  

## 2019-11-07 NOTE — Progress Notes (Signed)
Quick evening rounds  Patient is deeply sedated and synchronous with the ventilator and appears comfortable.  Head CT is reported as no bleed.  Instructed nurse to start heparin back per pharmacy consult without any bolus.  Apparently the wife is very tearful and nurse indicates that the son lives in [place] and the wife and son are contemplating about care limitations.  In addition she is asking if the patient is no longer contagious from Covid given the fact it has been a month since his Covid positivity   Plan -Restart heparin -Goals of care during the week and the day hours with the family. -Test Covid PCR and then work with infectious diseases to see if he can be removed from Covid isolation and transferred to regular ICU bed    SIGNATURE    Dr. Brand Males, M.D., F.C.C.P,  Pulmonary and Critical Care Medicine Staff Physician, [place] Director - Interstitial Lung Disease  Program  Pulmonary [employer] at [place], [place], [address]  Pager: [phone], If no answer or between  15:00h - 7:00h: call [phone] Telephone: [phone]  7:59 PM 11/07/2019

## 2019-11-07 NOTE — Progress Notes (Signed)
ANTICOAGULATION CONSULT NOTE - Follow Up Consult  Pharmacy Consult for Heparin Indication: pulmonary embolus  Allergies  Allergen Reactions  . Metoclopramide Hcl Hypertension    Reglan=Increases HBP symptoms    Patient Measurements: Height: 5\' 9"  (175.3 cm) Weight: 188 lb 15 oz (85.7 kg) IBW/kg (Calculated) : 70.7 Heparin Dosing Weight: 82.8 kg  Vital Signs: Temp: 99.5 F (37.5 C) (01/03 1144) Temp Source: Axillary (01/03 1144) BP: 92/50 (01/03 1145) Pulse Rate: 98 (01/03 1145)  Labs: Recent Labs    11/05/19 0148 11/05/19 0340 11/05/19 0438 11/06/19 0408 11/06/19 2049 11/06/19 2350 11/07/19 0329 11/07/19 1009 11/07/19 1025  HGB  --   --  8.6* 8.7*  --   --  7.7* 8.2*  --   HCT  --   --  29.9* 31.3*  --   --  28.6* 24.0*  --   PLT  --   --  154 188  --   --  206  --   --   APTT   < > 67*  --  85*  --  61*  --   --  53*  LABPROT  --   --   --  22.3*  --   --   --   --   --   INR  --   --   --  2.0*  --   --   --   --   --   HEPARINUNFRC  --  1.56*  --  1.42*  --  0.93*  --   --   --   CREATININE  --  3.21*  --  3.20*  --   --  3.12*  --   --   CKTOTAL  --   --   --  102  --   --   --   --   --   CKMB  --   --   --  3.4  --   --   --   --   --   TROPONINIHS  --   --   --   --  213*  --   --   --   --    < > = values in this interval not displayed.    Estimated Creatinine Clearance: 22.5 mL/min (A) (by C-G formula based on SCr of 3.12 mg/dL (H)).   Assessment: 74 yo male with PE.  Heparin was on hold for chest tube placement to to be resumed. Using aPTTs for monitoring with recent apixaban  Heparin being held for mental status changes. APTT drawn this morning 53, subtherapeutic due to the drip being stopped. Will d/c aPTT and HL orders continue to follow for restart.   Goal of Therapy:  aPTT 66-102 seconds Monitor platelets by anticoagulation protocol: Yes   Plan:  Hold heparin Follow for heparin restart  Nicoletta Dress, PharmD PGY2 Infectious Disease  Pharmacy Resident

## 2019-11-07 NOTE — Progress Notes (Signed)
Updated critical care note  Continue ventilator dyssynchrony.  He is only on fentanyl drip.  Chest x-ray shows bilateral chest tubes with apical pneumothoraces and subcutaneous air.  No real change.  ABG with adequate hypercapnia  Plan -Add Versed drip go for deep sedation -Based on course consider Nimbex paralysis =-Await head CT -Continue to hold IV heparin  Additional 20 minutes critical care time      SIGNATURE    Dr. Brand Males, M.D., F.C.C.P,  Pulmonary and Critical Care Medicine Staff Physician, [place] Director - Interstitial Lung Disease  Program  Pulmonary [place] at [place], [place], [address]  Pager: [phone], If no answer or between  15:00h - 7:00h: call [phone] Telephone: [phone]  12:13 PM 11/07/2019

## 2019-11-07 NOTE — Progress Notes (Signed)
RT NOTE: RT transported patient with RN from 3M09 to CT and back without complications. RT will continue to monitor.

## 2019-11-07 NOTE — Progress Notes (Signed)
ANTICOAGULATION CONSULT NOTE - Follow Up Consult  Pharmacy Consult for Heparin Indication: pulmonary embolus  Allergies  Allergen Reactions  . Metoclopramide Hcl Hypertension    Reglan=Increases HBP symptoms    Patient Measurements: Height: 5\' 9"  (175.3 cm) Weight: 184 lb 4.9 oz (83.6 kg) IBW/kg (Calculated) : 70.7 Heparin Dosing Weight: 82.8 kg  Vital Signs: Temp: 98.3 F (36.8 C) (01/03 0000) Temp Source: Oral (01/03 0000) BP: 172/65 (01/03 0200) Pulse Rate: 110 (01/03 0200)  Labs: Recent Labs    11/04/19 0449 11/04/19 0450 11/04/19 0742 11/05/19 0340 11/05/19 0438 11/05/19 1241 11/06/19 0408 11/06/19 2049 11/06/19 2350  HGB  --  8.3*   < >  --  8.6* 9.2* 8.7*  --   --   HCT  --  28.0*   < >  --  29.9* 27.0* 31.3*  --   --   PLT  --  97*  --   --  154  --  188  --   --   APTT  --   --   --  67*  --   --  85*  --  61*  LABPROT  --   --   --   --   --   --  22.3*  --   --   INR  --   --   --   --   --   --  2.0*  --   --   HEPARINUNFRC  --  1.50*  --  1.56*  --   --  1.42*  --  0.93*  CREATININE 3.46*  --   --  3.21*  --   --  3.20*  --   --   CKTOTAL  --   --   --   --   --   --  102  --   --   CKMB  --   --   --   --   --   --  3.4  --   --   TROPONINIHS  --   --   --   --   --   --   --  213*  --    < > = values in this interval not displayed.    Estimated Creatinine Clearance: 20.3 mL/min (A) (by C-G formula based on SCr of 3.2 mg/dL (H)).   Assessment: 74 yo male with PE.  Heparin was on hold for chest tube placement to to be resumed. Using aPTTs for monitoring with recent apixaban  1/3 AM update:  APTT sub-therapeutic   Goal of Therapy:  aPTT 66-102 seconds Monitor platelets by anticoagulation protocol: Yes   Plan:  -Inc heparin to 1150 units/hr -Re-check aPTT at 1100 -Daily aPTT, heparin level and CBC  Narda Bonds, PharmD, BCPS Clinical Pharmacist Phone: [phone]

## 2019-11-07 NOTE — Progress Notes (Signed)
Spoke with CCM about CT scan results and that no head bleed was shown. MD ok with restarting heparin but no bolus. Updated wife as well who was tearful and understanding that pt is very sick.

## 2019-11-07 NOTE — Progress Notes (Signed)
RT NOTE: RT reported critical PCO2 of 71.5 to Ramaswamy,MD.  ABG as follows: pH 7.25, PCO2 71.5, PO2 98 and HCO3 31.7. No new orders at this time. RT will continue to monitor.

## 2019-11-07 NOTE — Progress Notes (Signed)
Per CCM use cuff pressure d/t arterial line not reading accurately.

## 2019-11-07 NOTE — Progress Notes (Signed)
NAME:  Matthew Bates, MRN:  [id number], DOB:  08/05/1946, LOS: 8 ADMISSION DATE:  10/24/2019, CONSULTATION DATE:  10/31/19 CHIEF COMPLAINT:  Cardiac arrest  Brief History   74 yo male was discharged from [facility] on 12/19 after treatment for COVID 19 pneumonia (received remdesivir and steroids), and DVT with presumed PE and sent home on eliquis.  Developed progressive dyspnea with hypoxia and returned to ER on 12/26, and intubated by EMS prior to arrival.  Developed bradycardia with PEA, then VT requiring defibrillation and started on amiodarone.  Past Medical History  CAD Cataract CKD DM GERD GOUT Hepatitis High triglycerides HTN Obesity  Significant Hospital Events   12/05 to 12/19 Admit to [facility] with covid 12/26 Admit to [facility] after PEA/VT cardiac arrest with hypoxia and VDRF. ECHO 12/6  - lvef 45% and repeat 12/27 - similar 12/29 multiple runs of VT, amio stopped 12/31 - Soft BP after lasix yesterday requiring low dose levo.Vent dysynchrony requiring increased sedation overnight. 1/1 -  On vent. Off pededex, Off levophed gtt. Remains on fent gtt and on IV heparin gtt. Increased fio2 need last night to 60% (now 50%) and peep 10.  T max 99/80F. WBC 10-11k. PCT coming down through 11/03/2019. AKI better - creat 3.2 but Na worse 153 and K 5.0  1/2 -  -overnight developed subcutaneous air and CT scan of the chest is revealed small bilateral pneumothoraces with bilateral ARDS consistent with Covid.  He continues to be normotensive.  FiO2 40% with a PEEP of 10.  He is on a fentanyl drip and a heparin drip.  His Precedex is off.  He is getting free water.  Consults:    Procedures:  ETT 12/26 >>  Rt femoral CVL 12/26 >>  Needs change 1/2 or 1/3 to IJ/subclav (held off 1/2 due to lot of subcut air) Rt radial a line 12/27 >>  Rt 20 Fr chest tube (ptx) 1/2 Left Wayne 1/2 >>  Significant Diagnostic Tests:  12/11 B/l LE venous doppler: Right: Findings consistent with acute deep vein thrombosis  involving the right posterior tibial veins, and right peroneal veins. Left: Findings consistent with acute deep vein thrombosis involving the left soleal veins, and left gastrocnemius veins. No cystic structure found in the popliteal fossa.  CT head 12/27 >> no acute findings Echo 12/27 >> LVEF 45-50%, RV with reduced systolic function and enlargement, LA dilated  Micro Data:  Blood 12/26 >>ngtd Blood 12/27: ngtd Urine 12/27: neg Sputum 12/26 >> not obtained [id number] 1/2 trach aspirate ->   Antimicrobials:  Vancomycin 12/26 >>12/29 Cefepime 12/26 >>  1/1 Zosyn 12/26 > 12/27  xxxx vanc 1/2 Cefepime 1/2       Interim history/subjective:    11/07/2019 - Back on IV heparin gtt after chest tubes  Placed. Vent continues. Not on pressors. started on abx last night for concern for VAP. Tmax 100 but wbc down bu tPCT up at 0.5. S/p Bialteral chest tubes yestrefdy for ptx. . Creat better byut hypernatermia worse - Na 153 and  bUN wose.   RN concrned patient not responsive following sedation wean  Also significant ven dysnchrony + - Pip ok  Objective   Blood pressure 139/62, pulse (!) 112, temperature 98.8 F (37.1 C), temperature source Axillary, resp. rate (!) 32, height 5\' 9"  (1.753 m), weight 85.7 kg, SpO2 90 %.    Vent Mode: PRVC FiO2 (%):  [40 %-100 %] 60 % Set Rate:  [32 bmp] 32 bmp Vt Set:  [id number]  mL] 420 mL PEEP:  [7 cmH20-10 cmH20] 7 cmH20 Plateau Pressure:  [20 cmH20-27 cmH20] 20 cmH20   Intake/Output Summary (Last 24 hours) at 11/07/2019 [id number] Last data filed at 11/07/2019 0800 Gross per 24 hour  Intake 3785.64 ml  Output 2720 ml  Net 1065.64 ml   Filed Weights   11/05/19 0100 11/06/19 0200 11/07/19 0425  Weight: 82.2 kg 83.6 kg 85.7 kg     General Appearance:  Looks criticall il Head:  Normocephalic, without obvious abnormality, atraumatic Eyes:  PERRL - yes, conjunctiva/corneas - mddy No un-equal pupuls     Ears:  Normal external ear canals, both ears Nose:  G  tube - no Throat:  ETT TUBE - yes , OG tube - ye Neck:  Supple,  No enlargement/tenderness/nodules. Subcut air + improved Lungs:  Bilateral squakes, paradoxical rspiraton. Rt chest tube tidaling. Left waye - no tidal Heart:  S1 and S2 normal, no murmur, CVP - x.  Pressors - no Abdomen:  Soft, no masses, no organomegaly Genitalia / Rectal:  Not done Extremities:  Extremities- intact Skin:  ntact in exposed areas . Sacral area - not examined Neurologic:  Sedation - off fent gtt during exam -> RASS - -4/-5        RESOLVED   Shock - resolved.  Hypotension at this point likely sedation + effects of lasix. - Continue low dose levo as needed for goal MAP > 65 - cont supportive care    Assessment & Plan:   Acute on chronic (4L since last d/c) hypoxic,hypercapnic respiratory after recent treatment for COVID 19 pneumonia. Suspected PE - new echo c/w RHS with suppressive EF along with + DVT's Small bilateral PTx 11/06/2019 - s/p bilatreal chest tube   11/07/2019 - > does not meet criteria for SBT/Extubation in setting of Acute Respiratory Failure due to hyopemixa, ARDs, vent dynschrony and acute encephalopathy    Plan - continuebilateral chest tube .11/07/2019 - full vent support - VAP bundele - check abg and cxr stat -> if PF ratio poor start nimbe +/- prone and also rule out worsening ptx   - ARDS Measure  - Target TVol 6-8cc/kgIBW  - Target Plateau Pressure < 30cm H20  - Target driving pressure less than 15 cm of water  - Target PaO2 55-65: titrate PEEP/FiO2 per protocol  - As long as PaO2 to FiO2 ratio is less than 1:150 position in prone position for 16 hours a day   COVID -19 - s/p remdesivir  11/07/2019 -off decadron since 11/06/2019 Plan  - monitor off decadron   Concern for VAP   11/07/2019  -  PCT 0.5 r and tmax 100.4 but wbc better. Concern for vAP and abx startede Plan  -  await  sputum culture 11/05/2019 given increased fio2 needs - cotinue empiric abx    PEA/VT  arrest >> likely from acute hypoxic respiratory failure. Acute on chronic systolic CHF. Hx of CAD s/p CABG, HTN, Hypertriglyceridemia. - on fenofibrate and statin Persistent afib with rvr - resolved RVR.  Amio stopped 12/29 due to VT At increased risk QTc - 428msec on 11/03/2019    11/07/2019 - no acute change   Plan - continue ASA, crestor, fenofibrate, - will d/w wife and dc fenobirrate due to rhabdo risk and ? benefit - hold  heparin gtt due to mental status - check ekg and trop 11/11/19   B/l lower leg DVT's. renal function precludes CT angiogram chest at this time (however echo supportive of acute PE  and has + DVTs)  11/07/2019 - back on iv heparin since chest tube  Plan -hold heparin gtt due to metnal status change    Thrombocytopenia: 114->125-->86 > 82 throuhg 12/30 and improved while on heparin. HITT panel negative 11/02/2019. Mmedical illness related. Normalized 11/05/2019  plan - monitor   Anemia critical illness   11/07/2019 - hgb 7.7 and 1gm% drop without overt bleeding or pressor need  Plan  .- PRBC for hgb </= 6.9gm%    - exceptions are   -  if ACS susepcted/confirmed then transfuse for hgb </= 8.0gm%,  or    -  active bleeding with hemodynamic instability, then transfuse regardless of hemoglobin value   At at all times try to transfuse 1 unit prbc as possible with exception of active hemorrhage     -    Acute metabolic encephalopathy from hypoxia, sepsis, renal failure. 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 Rise stopped  Plan  - monitor   Hypernatremia and AKI   - 11/07/2019 worsening hypernatermia with AKI ? Cause despite free water  plan - Continue free water per tube but will  icnrease  From 354mL q4h to 349mL q3h - Hold further lasix  DM type II with hyperglycemia -SSI, novolog  Pressure ulcers. - Stage 2 Rt sacrum, present prior to this admission  Best practice:  Diet: tube feeds  DVT prophylaxis: Heparin drip > hold 11/07/2019  GI prophylaxis: protonix  Mobility: bed rest  Code Status: Full code  Disposition: ICU  Family communication: wife updated   - 11/06/2019 about pneumothorax. - consented for chest tube (will need to stop heparin). Wife wanted to know if we should give plasma or remdesivir - answered in the negative. Consented wife for CVL. Wfe says 2 night ago a male nurse took care of patient -> and inceased sedation . She thinks that this made patient need increased vent settings . So she does not want "that male nurse" taking care of the patient. I answered that sedation typically helps reduce ventilator demand. She verbalized understand but still said she does not want that RN taking care of the patient.   - 11/07/2019: updated wife of mental status change -> Get Head CT. Wife advised to think of no CPR. For now full code    Hope   The patient Matthew Bates is critically ill with multiple organ systems failure and requires high complexity decision making for assessment and support, frequent evaluation and titration of therapies, application of advanced monitoring technologies and extensive interpretation of multiple databases.   Critical Care Time devoted to patient care services described in this note is  60  Minutes. This time reflects time of care of this signee Dr Brand Males. This critical care time does not reflect procedure time, or teaching time or supervisory time of PA/NP/Med student/Med Resident etc but could involve care discussion time     Dr. Brand Males, M.D., [facility].C.P Pulmonary and Critical Care Medicine Staff Physician [place] [facility] Pager: [phone],  If no answer or between  15:00h - 7:00h: call [phone]  11/07/2019 10:13 AM  LABS    PULMONARY Recent Labs  Lab 11/01/19 0355 11/02/19 0338 11/03/19 0258 11/05/19 0148 11/05/19 1241  PHART 7.342* 7.304* 7.236* 7.315* 7.280*  PCO2ART 42.7 52.6* 67.5* 54.9* 58.6*  PO2ART 78.0* 86.0 92.0 69.0* 84.0  HCO3 23.2 26.1 28.7* 28.0 27.4  TCO2 24 28 31 30 29   O2SAT 95.0 95.0 95.0 92.0 94.0    CBC Recent Labs  Lab 11/05/19 0438 11/05/19 1241 11/06/19 0408 11/07/19 0329  HGB 8.6* 9.2* 8.7* 7.7*  HCT 29.9* 27.0* 31.3* 28.6*  WBC 11.1*  --  11.4* 7.1  PLT 154  --  188 206    COAGULATION Recent Labs  Lab 11/06/19 0408  INR 2.0*    CARDIAC  No results for input(s): TROPONINI in the last 168 hours. No results for input(s): PROBNP in the last 168 hours.   CHEMISTRY Recent Labs  Lab 11/03/19 0208 11/04/19 0449 11/05/19 0148 11/05/19 0340 11/05/19 1241 11/06/19 0408 11/07/19 0329  NA 150* 153* 152* 153* 153* 153* 154*  K 4.7 4.6 4.8 5.0 5.2* 5.3* 5.3*  CL 114* 114*  --  115*  --  116* 116*  CO2 27 24  --  26  --  26 29  GLUCOSE 146* 190*  --  176*  --  293* 263*  BUN 97* 98*  --  108*  --  130* 140*  CREATININE 3.46* 3.46*  --  3.21*  --  3.20* 3.12*  CALCIUM 7.4* 7.5*  --  7.7*  --  7.8* 7.8*  MG 2.4 2.3  --  2.5*  --  2.7* 2.5*  PHOS 4.8* 4.6  --  5.4*  --  5.0* 3.5   Estimated Creatinine Clearance: 22.5 mL/min (A) (by C-G formula based on SCr of 3.12 mg/dL (H)).   LIVER Recent Labs  Lab 11/01/19 0316 11/02/19 0335 11/03/19 0208 11/06/19 0408  AST 43* 36 37 21  ALT 41 38 34 24  ALKPHOS 84 89 96 75  BILITOT 0.8 0.8 0.6 0.7  PROT 5.2* 5.3* 5.8* 5.9*  ALBUMIN 1.7* 1.8* 1.9* 1.9*  INR  --   --   --  2.0*     INFECTIOUS Recent Labs  Lab 11/01/19 1227 11/01/19 1645 11/05/19 1212 11/05/19 2341 11/06/19 0408 11/07/19 0329  LATICACIDVEN 1.7 1.5  --  0.8  --   --   PROCALCITON 1.77  --  0.34  --  0.44 0.50     ENDOCRINE CBG (last 3)   Recent Labs    11/06/19 2350 11/07/19 0329 11/07/19 0749  GLUCAP 254* 227* 211*         IMAGING x48h  - image(s) personally visualized  -   highlighted in bold CT CHEST WO CONTRAST  Result Date: 11/06/2019 CLINICAL DATA:  Pneumothorax and pneumomediastinum. EXAM: CT CHEST WITHOUT CONTRAST TECHNIQUE: Multidetector CT imaging of the chest was performed following the standard protocol without IV contrast. COMPARISON:  Radiograph yesterday. FINDINGS: Cardiovascular: Aortic atherosclerosis. No aortic aneurysm. There are coronary artery calcifications. Heart is normal in size. No pericardial effusion. Mediastinum/Nodes: Endotracheal and enteric tubes in place. Extensive pneumomediastinum. Pneumomediastinum extends from the thoracic inlet to the diaphragm. Circumferential air about the heart. No obvious adenopathy. No thyroid nodule. Lungs/Pleura: Small bilateral pneumothoraces. Diffuse bilateral lung opacities with patchy ground-glass throughout both lungs, consolidative peripheral opacities in both lower lobes and dependent right upper lobe. Central air bronchograms. Small right and trace left pleural effusion. Upper Abdomen: Extrapleural air tracks anteriorly in the upper abdomen. Musculoskeletal: Extensive subcutaneous emphysema  throughout the right greater than left chest wall. Extensive air tracks into the soft tissues of the neck and supraclavicular regions. Soft tissue emphysema tracks in the anterior abdominal wall, more so on the left. Median sternotomy. No acute osseous abnormality. Multilevel degenerative change in the spine. IMPRESSION: 1. Small bilateral pneumothoraces. 2. Extensive pneumomediastinum extending from the thoracic inlet to the diaphragm. Circumferential air about the heart. Extrapleural air tracks into the anterior upper abdomen. 3. Extensive subcutaneous emphysema about the chest wall soft tissues and into the neck. 4. Moderate to severe diffuse bilateral airspace disease with  ground-glass and consolidative opacities, consistent with COVID-19 pneumonia. Small right and trace left pleural effusions. Aortic Atherosclerosis (ICD10-I70.0). Electronically Signed   By: Keith Rake M.D.   On: 11/06/2019 04:07   DG CHEST PORT 1 VIEW  Result Date: 11/07/2019 CLINICAL DATA:  Reason for exam on COVID positive pt: Endotracheal tube present. EXAM: PORTABLE CHEST 1 VIEW COMPARISON:  Chest radiograph 11/06/2019 FINDINGS: Unchanged support apparatus. Stable cardiomediastinal contours status post median sternotomy and CABG. Persistent trace bilateral pneumothoraces in the lung apex. Unchanged extensive bilateral infiltrates. Diffuse subcutaneous emphysema about the chest and neck. No acute finding in the upper abdomen. IMPRESSION: Stable chest with persistent tiny bilateral apical pneumothoraces, extensive pulmonary infiltrates and subcutaneous emphysema. Electronically Signed   By: Audie Pinto M.D.   On: 11/07/2019 08:07   DG CHEST PORT 1 VIEW  Result Date: 11/06/2019 CLINICAL DATA:  Chest tube placement EXAM: PORTABLE CHEST 1 VIEW COMPARISON:  11/06/2019, 5:46 a.m. FINDINGS: Interval placement of left-sided pigtail chest tube and right-sided straight bore chest tube. There are small, less than 5% pneumothoraces at the bilateral lung apices, not significantly changed compared to prior examination. Endotracheal tube and esophagogastric tube are in unchanged position. Unchanged extensive bilateral heterogeneous and interstitial airspace opacity. Cardiomegaly status post median sternotomy and CABG. Extensive subcutaneous emphysema about the chest and neck. IMPRESSION: 1. Interval placement of left-sided pigtail chest tube and right-sided straight bore chest tube. 2. There are small, less than 5% pneumothoraces at the bilateral lung apices, not significantly changed compared to prior examination. 3. Extensive subcutaneous emphysema about the chest and neck. 4. Extensive bilateral  heterogeneous and interstitial airspace opacities, consistent with edema or infection and unchanged. Electronically Signed   By: Eddie Candle M.D.   On: 11/06/2019 15:57   DG CHEST PORT 1 VIEW  Result Date: 11/06/2019 CLINICAL DATA:  Endotracheal tube. EXAM: PORTABLE CHEST 1 VIEW COMPARISON:  November 05, 2019. FINDINGS: Stable cardiomediastinal silhouette. Endotracheal and nasogastric tubes are unchanged in position. Stable minimal bilateral pneumothoraces are noted. Stable pneumomediastinum is noted. Stable bilateral lung opacities are noted concerning for multifocal pneumonia. Stable subcutaneous emphysema is seen bilaterally. Bony thorax is unremarkable. IMPRESSION: Stable support apparatus. Stable minimal bilateral pneumothoraces. Stable pneumomediastinum. Stable bilateral lung opacities are noted concerning for multifocal pneumonia. Stable subcutaneous emphysema. Electronically Signed   By: Marijo Conception M.D.   On: 11/06/2019 08:10   DG CHEST PORT 1 VIEW  Addendum Date: 11/06/2019   ADDENDUM REPORT: 11/06/2019 00:14 ADDENDUM: Critical Value/emergent results were called by telephone at the time of interpretation on 11/06/2019 at 12:14 am to Dr Oletta Darter, who verbally acknowledged these results. Electronically Signed   By: Keith Rake M.D.   On: 11/06/2019 00:14   Result Date: 11/06/2019 CLINICAL DATA:  Subcutaneous air. COVID positive. EXAM: PORTABLE CHEST 1 VIEW COMPARISON:  Radiograph earlier this day FINDINGS: Development of subcutaneous emphysema about the right greater than left supraclavicular soft  tissues and right chest wall. Small left lateral pneumothorax, apical component difficult to assess given subcutaneous emphysema. Air adjacent to the inferior heart and continuous diaphragm consistent with pneumomediastinum. No visualized right pneumothorax, evaluation partially obscured by subcutaneous emphysema. Endotracheal and enteric tubes remain in place. Diffuse bilateral heterogeneous airspace  disease. Post median sternotomy with unchanged mild cardiomegaly. IMPRESSION: 1. Development of subcutaneous emphysema about the right greater than left supraclavicular soft tissues and right chest wall. Small left lateral pneumothorax, apical component difficult to assess given subcutaneous emphysema. 2. Pneumomediastinum with air cell abutting the inferior heart. 3. Grossly unchanged bilateral airspace disease consistent with COVID pneumonia. Electronically Signed: By: Keith Rake M.D. On: 11/05/2019 23:43

## 2019-11-08 DIAGNOSIS — Z7901 Long term (current) use of anticoagulants: Secondary | ICD-10-CM

## 2019-11-08 DIAGNOSIS — K921 Melena: Secondary | ICD-10-CM

## 2019-11-08 DIAGNOSIS — D62 Acute posthemorrhagic anemia: Secondary | ICD-10-CM

## 2019-11-08 DIAGNOSIS — K5790 Diverticulosis of intestine, part unspecified, without perforation or abscess without bleeding: Secondary | ICD-10-CM

## 2019-11-08 LAB — BASIC METABOLIC PANEL
Anion gap: 9 (ref 5–15)
BUN: 181 mg/dL — ABNORMAL HIGH (ref 8–23)
CO2: 28 mmol/L (ref 22–32)
Calcium: 7.7 mg/dL — ABNORMAL LOW (ref 8.9–10.3)
Chloride: 112 mmol/L — ABNORMAL HIGH (ref 98–111)
Creatinine, Ser: 4.3 mg/dL — ABNORMAL HIGH (ref 0.61–1.24)
GFR calc Af Amer: 15 mL/min — ABNORMAL LOW (ref >60)
GFR calc non Af Amer: 13 mL/min — ABNORMAL LOW (ref >60)
Glucose, Bld: 382 mg/dL — ABNORMAL HIGH (ref 70–99)
Potassium: 6.9 mmol/L (ref 3.5–5.1)
Sodium: 149 mmol/L — ABNORMAL HIGH (ref 135–145)

## 2019-11-08 LAB — HEMOGLOBIN AND HEMATOCRIT, BLOOD
HCT: 24.7 % — ABNORMAL LOW (ref 39.0–52.0)
Hemoglobin: 6.5 g/dL — CL (ref 13.0–17.0)

## 2019-11-08 LAB — CBC
HCT: 26.3 % — ABNORMAL LOW (ref 39.0–52.0)
Hemoglobin: 7.1 g/dL — ABNORMAL LOW (ref 13.0–17.0)
MCH: 25 pg — ABNORMAL LOW (ref 26.0–34.0)
MCHC: 27 g/dL — ABNORMAL LOW (ref 30.0–36.0)
MCV: 92.6 fL (ref 80.0–100.0)
Platelets: 212 10*3/uL (ref 150–400)
RBC: 2.84 MIL/uL — ABNORMAL LOW (ref 4.22–5.81)
RDW: 20.2 % — ABNORMAL HIGH (ref 11.5–15.5)
WBC: 12.2 10*3/uL — ABNORMAL HIGH (ref 4.0–10.5)
nRBC: 0.5 % — ABNORMAL HIGH (ref 0.0–0.2)

## 2019-11-08 LAB — CULTURE, RESPIRATORY W GRAM STAIN
Culture: NORMAL
Special Requests: NORMAL

## 2019-11-08 LAB — APTT
aPTT: 84 seconds — ABNORMAL HIGH (ref 24–36)
aPTT: 86 seconds — ABNORMAL HIGH (ref 24–36)

## 2019-11-08 LAB — SARS CORONAVIRUS 2 (TAT 6-24 HRS): SARS Coronavirus 2: POSITIVE — AB

## 2019-11-08 LAB — HEPARIN LEVEL (UNFRACTIONATED): Heparin Unfractionated: 1.01 IU/mL — ABNORMAL HIGH (ref 0.30–0.70)

## 2019-11-08 LAB — GLUCOSE, CAPILLARY
Glucose-Capillary: 294 mg/dL — ABNORMAL HIGH (ref 70–99)
Glucose-Capillary: 344 mg/dL — ABNORMAL HIGH (ref 70–99)
Glucose-Capillary: 341 mg/dL — ABNORMAL HIGH (ref 70–99)

## 2019-11-08 LAB — PHOSPHORUS: Phosphorus: 4.1 mg/dL (ref 2.5–4.6)

## 2019-11-08 LAB — TROPONIN I (HIGH SENSITIVITY): Troponin I (High Sensitivity): 267 ng/L (ref <18)

## 2019-11-08 LAB — MAGNESIUM: Magnesium: 2.7 mg/dL — ABNORMAL HIGH (ref 1.7–2.4)

## 2019-11-08 LAB — PREPARE RBC (CROSSMATCH)

## 2019-11-08 MED ORDER — PRISMASOL BGK 0/2.5 32-2.5 MEQ/L REPLACEMENT SOLN
Status: DC
  Filled 2019-11-08 (×2): qty 5000

## 2019-11-08 MED ORDER — NOREPINEPHRINE 4 MG/250ML-% IV SOLN
INTRAVENOUS | Status: AC
  Filled 2019-11-08: qty 250

## 2019-11-08 MED ORDER — SODIUM CHLORIDE 0.9 % IV SOLN
8.0000 mg/h | INTRAVENOUS | Status: DC
  Administered 2019-11-08: 8 mg/h via INTRAVENOUS
  Filled 2019-11-08: qty 80

## 2019-11-08 MED ORDER — SODIUM CHLORIDE 0.9% IV SOLUTION: Freq: Once | INTRAVENOUS | Status: DC

## 2019-11-08 MED ORDER — CALCIUM GLUCONATE-NACL 2-0.675 GM/100ML-% IV SOLN
2.0000 g | Freq: Once | INTRAVENOUS | Status: AC
  Administered 2019-11-08: 2000 mg via INTRAVENOUS
  Filled 2019-11-08: qty 100

## 2019-11-08 MED ORDER — DEXTROSE 50 % IV SOLN
1.0000 | Freq: Once | INTRAVENOUS | Status: DC
  Filled 2019-11-08: qty 50

## 2019-11-08 MED ORDER — PRISMASOL BGK 4/2.5 32-4-2.5 MEQ/L IV SOLN
INTRAVENOUS | Status: DC
  Filled 2019-11-08 (×7): qty 5000

## 2019-11-08 MED ORDER — PANTOPRAZOLE SODIUM 40 MG IV SOLR: 40.0000 mg | Freq: Two times a day (BID) | INTRAVENOUS | Status: DC

## 2019-11-08 MED ORDER — SODIUM CHLORIDE 0.9 % IV SOLN
80.0000 mg | Freq: Once | INTRAVENOUS | Status: AC
  Administered 2019-11-08: 80 mg via INTRAVENOUS
  Filled 2019-11-08: qty 80

## 2019-11-08 MED ORDER — HEPARIN SODIUM (PORCINE) 1000 UNIT/ML DIALYSIS
1000.0000 [IU] | INTRAMUSCULAR | Status: DC | PRN
  Filled 2019-11-08: qty 6

## 2019-11-08 MED ORDER — INSULIN ASPART 100 UNIT/ML IV SOLN: 10.0000 [IU] | Freq: Once | INTRAVENOUS | Status: DC

## 2019-11-08 MED ORDER — PRISMASOL BGK 4/2.5 32-4-2.5 MEQ/L REPLACEMENT SOLN
Status: DC
  Filled 2019-11-08 (×2): qty 5000

## 2019-11-09 LAB — BPAM RBC
Blood Product Expiration Date: 202101282359
Blood Product Expiration Date: 202101282359
Unit Type and Rh: 6200
Unit Type and Rh: 6200

## 2019-11-09 LAB — TYPE AND SCREEN
ABO/RH(D): A POS
Antibody Screen: NEGATIVE
Unit division: 0
Unit division: 0

## 2019-11-22 ENCOUNTER — Other Ambulatory Visit: Payer: Self-pay

## 2019-12-06 NOTE — Consult Note (Signed)
[place]  Reason for Consultation: AKI, hyperkalemia Requesting Provider: Dr Vaughan Browner  HPI: Matthew Bates is an 74 y.o. male  With HTN, obesity, HL, DM, CAD, CKD 3 who is seen for eval and management of AKI and hyperkalemia.    Dx COVID PNA early 10/2019, discharged 12/19. Readmitted 12/26 after PEA and VT arrest suspected PEs, also with VAP, pneumothoaces.  He's been in the [facility] ICU since then and has had complicated course with DVT, now GIB, worsening AKI now with hyperkalemia, BUN 181, Cr 4.3.   UOP 1.2L yesterday. K 6.9.  Team spoke with family - wish to pursue all measures currently.   PMH: Past Medical History:  Diagnosis Date  . Anemia   . CAD (coronary artery disease)   . Carotid artery occlusion   . Cataract   . Chronic kidney disease   . COVID-19   . DM (diabetes mellitus) ([place])   . GERD (gastroesophageal reflux disease)   . Gout   . Hepatitis    as a child  . High triglycerides   . HTN (hypertension)   . Obesity    PSH: Past Surgical History:  Procedure Laterality Date  . ANGIOPLASTY     --cardiac  . APPENDECTOMY    . CAROTID ENDARTERECTOMY  05/22/11   Left CEA  . CATARACT EXTRACTION Bilateral   . COLONOSCOPY  10-09-13  . KNEE SURGERY Right   . PR VEIN BYPASS GRAFT,AORTO-FEM-POP  2010  . spleen surgery     Past Medical History:  Diagnosis Date  . Anemia   . CAD (coronary artery disease)   . Carotid artery occlusion   . Cataract   . Chronic kidney disease   . COVID-19   . DM (diabetes mellitus) ([place])   . GERD (gastroesophageal reflux disease)   . Gout   . Hepatitis    as a child  . High triglycerides   . HTN (hypertension)   . Obesity     Medications:  I have reviewed the patient's current medications.  Medications Prior to Admission  Medication Sig Dispense Refill  . acetaminophen (TYLENOL) 325 MG tablet Take 2 tablets (650 mg total) by mouth every 6 (six) hours as needed for mild pain or headache (fever  >/= 101).    Marland Kitchen amLODipine (NORVASC) 10 MG tablet Take 1 tablet (10 mg total) by mouth daily. 30 tablet 1  . apixaban (ELIQUIS) 5 MG TABS tablet Take 2 tablets (10mg ) twice daily for 7 days, then 1 tablet (5mg ) twice daily 60 tablet 1  . aspirin EC 81 MG tablet Take 81 mg by mouth daily.    . Cyanocobalamin (B-12) 2500 MCG TABS Take 2,500 mcg by mouth daily.     . famotidine (PEPCID) 40 MG tablet Take 40 mg by mouth 2 (two) times daily.    . fenofibrate 160 MG tablet Take 1 tablet (160 mg total) by mouth daily. 30 tablet 5  . FERREX 150 150 MG capsule Take 150 mg by mouth daily.     . folic acid (FOLVITE) 1 MG tablet Take 1 mg by mouth daily.      . furosemide (LASIX) 40 MG tablet Take 1 tablet (40 mg total) by mouth daily. 30 tablet   . labetalol (NORMODYNE) 200 MG tablet TAKE 1 TABLET BY MOUTH TWICE A DAY (Patient taking differently: Take 200 mg by mouth 3 (three) times daily. ) 60 tablet 0  . linagliptin (TRADJENTA) 5 MG TABS tablet Take 1  tablet (5 mg total) by mouth daily. 30 tablet 1  . metFORMIN (GLUCOPHAGE) 1000 MG tablet Take 1,000 mg by mouth 2 (two) times daily with a meal.     . Multiple Vitamin (MULTIVITAMIN) capsule Take 1 capsule by mouth daily.      . rosuvastatin (CRESTOR) 20 MG tablet Take 20 mg by mouth daily.  1    ALLERGIES:   Allergies  Allergen Reactions  . Metoclopramide Hcl Hypertension    Reglan=Increases HBP symptoms    FAM HX: Family History  Problem Relation Age of Onset  . Heart attack Father   . Hyperlipidemia Father   . Hypertension Father   . Heart disease Father        Heart Disease before age 3- PVD  . Diabetes Brother   . Heart disease Brother        Heart Disese before age 32  . Hyperlipidemia Brother   . Hypertension Brother   . Leukemia Brother   . Heart disease Mother        Heart Disease before age 82  . Hyperlipidemia Mother   . Hypertension Mother   . Heart attack Mother   . Coronary artery disease Other   . Colon cancer Neg Hx    . Esophageal cancer Neg Hx   . Pancreatic cancer Neg Hx   . Stomach cancer Neg Hx   . Liver disease Neg Hx   . Rectal cancer Neg Hx     Social History:   reports that he quit smoking about 49 years ago. His smoking use included cigarettes. He has never used smokeless tobacco. He reports current alcohol use. He reports that he does not use drugs.  ROS: unable to obtain from intubated sedated pt  Blood pressure (!) 139/57, pulse 94, temperature 97.6 F (36.4 C), temperature source Axillary, resp. rate (!) 32, height 5\' 9"  (1.753 m), weight 88.1 kg, SpO2 97 %. PHYSICAL EXAM: Evaluated patient outside room due to covid precautions.  Intubated and sedated, FiO2 70% with sats in high 90s.     Results for orders placed or performed during the hospital encounter of 10/25/2019 (from the past 48 hour(s))  Glucose, capillary     Status: Abnormal   Collection Time: 11/06/19  3:59 PM  Result Value Ref Range   Glucose-Capillary 253 (H) 70 - 99 mg/dL  Glucose, capillary     Status: Abnormal   Collection Time: 11/06/19  7:36 PM  Result Value Ref Range   Glucose-Capillary 213 (H) 70 - 99 mg/dL  Troponin I (High Sensitivity)     Status: Abnormal   Collection Time: 11/06/19  8:49 PM  Result Value Ref Range   Troponin I (High Sensitivity) 213 (HH) <18 ng/L    Comment: CRITICAL RESULT CALLED TO, READ BACK BY AND VERIFIED WITH: CHEEK E,RN 11/06/19 2137 WAYK Performed at [facility], [place] [address] [place], [place] [address]   APTT     Status: Abnormal   Collection Time: 11/06/19 11:50 PM  Result Value Ref Range   aPTT 61 (H) 24 - 36 seconds    Comment:        IF BASELINE aPTT IS ELEVATED, SUGGEST PATIENT RISK ASSESSMENT BE USED TO DETERMINE APPROPRIATE ANTICOAGULANT THERAPY. Performed at [facility], [place] [address], [place], [place] [address]   Heparin level (unfractionated)     Status: Abnormal   Collection Time: 11/06/19 11:50 PM  Result Value Ref Range   Heparin  Unfractionated 0.93 (H)  0.30 - 0.70 IU/mL    Comment: (NOTE) If heparin results are below expected values, and patient dosage has  been confirmed, suggest follow up testing of antithrombin III levels. Performed at [facility], [place] [address], [place], [place] [address]   Glucose, capillary     Status: Abnormal   Collection Time: 11/06/19 11:50 PM  Result Value Ref Range   Glucose-Capillary 254 (H) 70 - 99 mg/dL  CBC     Status: Abnormal   Collection Time: 11/07/19  3:29 AM  Result Value Ref Range   WBC 7.1 4.0 - 10.5 K/uL   RBC 3.09 (L) 4.22 - 5.81 MIL/uL   Hemoglobin 7.7 (L) 13.0 - 17.0 g/dL   HCT 28.6 (L) 39.0 - 52.0 %   MCV 92.6 80.0 - 100.0 fL   MCH 24.9 (L) 26.0 - 34.0 pg   MCHC 26.9 (L) 30.0 - 36.0 g/dL   RDW 20.6 (H) 11.5 - 15.5 %   Platelets 206 150 - 400 K/uL   nRBC 0.8 (H) 0.0 - 0.2 %    Comment: Performed at [facility], [place] [address] [place], [place] [id number]  Basic metabolic panel     Status: Abnormal   Collection Time: 11/07/19  3:29 AM  Result Value Ref Range   Sodium 154 (H) 135 - 145 mmol/L   Potassium 5.3 (H) 3.5 - 5.1 mmol/L   Chloride 116 (H) 98 - 111 mmol/L   CO2 29 22 - 32 mmol/L   Glucose, Bld 263 (H) 70 - 99 mg/dL   BUN 140 (H) 8 - 23 mg/dL   Creatinine, Ser 3.12 (H) 0.61 - 1.24 mg/dL   Calcium 7.8 (L) 8.9 - 10.3 mg/dL   GFR calc non Af Amer 19 (L) >60 mL/min   GFR calc Af Amer 22 (L) >60 mL/min   Anion gap 9 5 - 15    Comment: Performed at [facility] [address] [place], [place] [address]  MAG AM     Status: Abnormal   Collection Time: 11/07/19  3:29 AM  Result Value Ref Range   Magnesium 2.5 (H) 1.7 - 2.4 mg/dL    Comment: Performed at [facility] [address] [place], [place] [address]  Phosphorus     Status: None   Collection Time: 11/07/19  3:29 AM  Result Value Ref Range   Phosphorus 3.5 2.5 - 4.6 mg/dL    Comment: Performed at [facility] [address], [place], [place] [address]   Procalcitonin     Status: None   Collection Time: 11/07/19  3:29 AM  Result Value Ref Range   Procalcitonin 0.50 ng/mL    Comment:        Interpretation: PCT (Procalcitonin) <= 0.5 ng/mL: Systemic infection (sepsis) is not likely. Local bacterial infection is possible. (NOTE)       Sepsis PCT Algorithm           Lower Respiratory Tract                                      Infection PCT Algorithm    ----------------------------     ----------------------------         PCT < 0.25 ng/mL                PCT < 0.10 ng/mL  Strongly encourage             Strongly discourage   discontinuation of antibiotics    initiation of antibiotics    ----------------------------     -----------------------------       PCT 0.25 - 0.50 ng/mL            PCT 0.10 - 0.25 ng/mL               OR       >80% decrease in PCT            Discourage initiation of                                            antibiotics      Encourage discontinuation           of antibiotics    ----------------------------     -----------------------------         PCT >= 0.50 ng/mL              PCT 0.26 - 0.50 ng/mL               AND        <80% decrease in PCT             Encourage initiation of                                             antibiotics       Encourage continuation           of antibiotics    ----------------------------     -----------------------------        PCT >= 0.50 ng/mL                  PCT > 0.50 ng/mL               AND         increase in PCT                  Strongly encourage                                      initiation of antibiotics    Strongly encourage escalation           of antibiotics                                     -----------------------------                                           PCT <= 0.25 ng/mL                                                 OR                                        >   80% decrease in PCT                                     Discontinue / Do not  initiate                                             antibiotics Performed at [facility], [place] [address], [place], [place] [address]   Glucose, capillary     Status: Abnormal   Collection Time: 11/07/19  3:29 AM  Result Value Ref Range   Glucose-Capillary 227 (H) 70 - 99 mg/dL  Triglycerides     Status: Abnormal   Collection Time: 11/07/19  3:29 AM  Result Value Ref Range   Triglycerides 150 (H) <150 mg/dL    Comment: Performed at [facility] [address], [place], [place] [address]  Glucose, capillary     Status: Abnormal   Collection Time: 11/07/19  7:49 AM  Result Value Ref Range   Glucose-Capillary 211 (H) 70 - 99 mg/dL  I-STAT 7, (LYTES, BLD GAS, ICA, H+H)     Status: Abnormal   Collection Time: 11/07/19 10:09 AM  Result Value Ref Range   pH, Arterial 7.256 (L) 7.350 - 7.450   pCO2 arterial 71.5 (HH) 32.0 - 48.0 mmHg   pO2, Arterial 98.0 83.0 - 108.0 mmHg   Bicarbonate 31.7 (H) 20.0 - 28.0 mmol/L   TCO2 34 (H) 22 - 32 mmol/L   O2 Saturation 96.0 %   Acid-Base Excess 4.0 (H) 0.0 - 2.0 mmol/L   Sodium 155 (H) 135 - 145 mmol/L   Potassium 5.5 (H) 3.5 - 5.1 mmol/L   Calcium, Ion 1.19 1.15 - 1.40 mmol/L   HCT 24.0 (L) 39.0 - 52.0 %   Hemoglobin 8.2 (L) 13.0 - 17.0 g/dL   Patient temperature 99.0 F    Collection site RADIAL, ALLEN'S TEST ACCEPTABLE    Drawn by VP    Sample type ARTERIAL    Comment NOTIFIED PHYSICIAN   APTT     Status: Abnormal   Collection Time: 11/07/19 10:25 AM  Result Value Ref Range   aPTT 53 (H) 24 - 36 seconds    Comment:        IF BASELINE aPTT IS ELEVATED, SUGGEST PATIENT RISK ASSESSMENT BE USED TO DETERMINE APPROPRIATE ANTICOAGULANT THERAPY. Performed at [facility], [place] [address], [place], [place] [address]   Glucose, capillary     Status: Abnormal   Collection Time: 11/07/19 11:42 AM  Result Value Ref Range   Glucose-Capillary 243 (H) 70 - 99 mg/dL  Glucose, capillary     Status: Abnormal   Collection Time:  11/07/19  4:22 PM  Result Value Ref Range   Glucose-Capillary 240 (H) 70 - 99 mg/dL  Glucose, capillary     Status: Abnormal   Collection Time: 11/07/19  8:08 PM  Result Value Ref Range   Glucose-Capillary 269 (H) 70 - 99 mg/dL  Glucose, capillary     Status: Abnormal   Collection Time: 11/07/19 11:51 PM  Result Value Ref Range   Glucose-Capillary 288 (H) 70 - 99 mg/dL  SARS CORONAVIRUS 2 (TAT 6-24 HRS) Nasopharyngeal Nasopharyngeal Swab     Status: Abnormal   Collection Time: 11/28/2019  3:55 AM   Specimen: Nasopharyngeal  Swab  Result Value Ref Range   SARS Coronavirus 2 POSITIVE (A) NEGATIVE    Comment: RESULT CALLED TO, READ BACK BY AND VERIFIED WITH: EErnesto Rutherford RN 10:05 11-10-2019 ([email or web address]) (NOTE) SARS-CoV-2 target nucleic acids are DETECTED. The SARS-CoV-2 RNA is generally detectable in upper and lower respiratory specimens during the acute phase of infection. Positive results are indicative of the presence of SARS-CoV-2 RNA. Clinical correlation with patient history and other diagnostic information is  necessary to determine patient infection status. Positive results do not rule out bacterial infection or co-infection with other viruses.  The expected result is Negative. Fact Sheet for Patients: SugarRoll.be Fact Sheet for Healthcare Providers: https://www.woods-mathews.com/ This test is not yet approved or cleared by the [place] FDA and  has been authorized for detection and/or diagnosis of SARS-CoV-2 by FDA under an Emergency Use Authorization (EUA). This EUA will remain  in effect (meaning this test can be used) for t he duration of the COVID-19 declaration under Section 564(b)(1) of the Act, 21 U.S.C. section 360bbb-3(b)(1), unless the authorization is terminated or revoked sooner. Performed at [facility], [place] [address], [place], [place] [address]   CBC     Status: Abnormal   Collection Time: 10-Nov-2019  4:21 AM   Result Value Ref Range   WBC 12.2 (H) 4.0 - 10.5 K/uL   RBC 2.84 (L) 4.22 - 5.81 MIL/uL   Hemoglobin 7.1 (L) 13.0 - 17.0 g/dL   HCT 26.3 (L) 39.0 - 52.0 %   MCV 92.6 80.0 - 100.0 fL   MCH 25.0 (L) 26.0 - 34.0 pg   MCHC 27.0 (L) 30.0 - 36.0 g/dL   RDW 20.2 (H) 11.5 - 15.5 %   Platelets 212 150 - 400 K/uL   nRBC 0.5 (H) 0.0 - 0.2 %    Comment: Performed at [facility], [place] [address] [place], [place] [address]  Magnesium     Status: Abnormal   Collection Time: 11/10/2019  4:21 AM  Result Value Ref Range   Magnesium 2.7 (H) 1.7 - 2.4 mg/dL    Comment: Performed at [facility] [address], [place], [place] [address]  Phosphorus     Status: None   Collection Time: 11/10/19  4:21 AM  Result Value Ref Range   Phosphorus 4.1 2.5 - 4.6 mg/dL    Comment: Performed at [facility] [address], [place], [place] [address]  Heparin level (unfractionated)     Status: Abnormal   Collection Time: 11/10/19  4:21 AM  Result Value Ref Range   Heparin Unfractionated 1.01 (H) 0.30 - 0.70 IU/mL    Comment: (NOTE) If heparin results are below expected values, and patient dosage has  been confirmed, suggest follow up testing of antithrombin III levels. Performed at [facility], [place] [address], [place], [place] [address]   Troponin I (High Sensitivity)     Status: Abnormal   Collection Time: 11-10-19  4:21 AM  Result Value Ref Range   Troponin I (High Sensitivity) 267 (HH) <18 ng/L    Comment: CRITICAL VALUE NOTED.  VALUE IS CONSISTENT WITH PREVIOUSLY REPORTED AND CALLED VALUE. Performed at [facility], [place] [address], [place],  [address]   APTT     Status: Abnormal   Collection Time: 11-10-2019  4:21 AM  Result Value Ref Range   aPTT 84 (H) 24 - 36 seconds    Comment:  IF BASELINE aPTT IS ELEVATED, SUGGEST PATIENT RISK ASSESSMENT BE USED TO DETERMINE APPROPRIATE ANTICOAGULANT THERAPY. Performed at [facility], [place] [address],  [place], [place] [address]   Glucose, capillary     Status: Abnormal   Collection Time: November 21, 2019  4:25 AM  Result Value Ref Range   Glucose-Capillary 294 (H) 70 - 99 mg/dL  Glucose, capillary     Status: Abnormal   Collection Time: 11/21/2019  7:55 AM  Result Value Ref Range   Glucose-Capillary 344 (H) 70 - 99 mg/dL  Glucose, capillary     Status: Abnormal   Collection Time: 11-21-2019 11:27 AM  Result Value Ref Range   Glucose-Capillary 341 (H) 70 - 99 mg/dL  APTT     Status: Abnormal   Collection Time: 21-Nov-2019 12:50 PM  Result Value Ref Range   aPTT 86 (H) 24 - 36 seconds    Comment:        IF BASELINE aPTT IS ELEVATED, SUGGEST PATIENT RISK ASSESSMENT BE USED TO DETERMINE APPROPRIATE ANTICOAGULANT THERAPY. Performed at [facility], [place] [address] [place], [place] [id number]   Basic metabolic panel     Status: Abnormal   Collection Time: 2019/11/21 12:50 PM  Result Value Ref Range   Sodium 149 (H) 135 - 145 mmol/L   Potassium 6.9 (HH) 3.5 - 5.1 mmol/L    Comment: CRITICAL RESULT CALLED TO, READ BACK BY AND VERIFIED WITH: L.FREI,RN 1418 11/21/2019 CLARK,S    Chloride 112 (H) 98 - 111 mmol/L   CO2 28 22 - 32 mmol/L   Glucose, Bld 382 (H) 70 - 99 mg/dL   BUN 181 (H) 8 - 23 mg/dL   Creatinine, Ser 4.30 (H) 0.61 - 1.24 mg/dL   Calcium 7.7 (L) 8.9 - 10.3 mg/dL   GFR calc non Af Amer 13 (L) >60 mL/min   GFR calc Af Amer 15 (L) >60 mL/min   Anion gap 9 5 - 15    Comment: Performed at [facility] [address], [place], [place] [address]  Hemoglobin and hematocrit, blood     Status: Abnormal   Collection Time: 11/21/19 12:50 PM  Result Value Ref Range   Hemoglobin 6.5 (LL) 13.0 - 17.0 g/dL    Comment: REPEATED TO VERIFY THIS CRITICAL RESULT HAS VERIFIED AND BEEN CALLED TO MORGAN WOOD RN BY SHANNON [place] ON 01 04 2021 AT 70, AND HAS BEEN READ BACK.     HCT 24.7 (L) 39.0 - 52.0 %    Comment: Performed at [facility], [place] [address], [place], [place] [address]     CT HEAD WO CONTRAST  Result Date: 11/07/2019 CLINICAL DATA:  Encephalopathy EXAM: CT HEAD WITHOUT CONTRAST TECHNIQUE: Contiguous axial images were obtained from the base of the skull through the vertex without intravenous contrast. COMPARISON:  10/31/2019 FINDINGS: Brain: No evidence of acute infarction, hemorrhage, hydrocephalus, extra-axial collection or mass lesion/mass effect. Scattered low-density changes within the periventricular and subcortical white matter compatible with chronic microvascular ischemic change. Mild diffuse cerebral volume loss. Vascular: Mild atherosclerotic calcifications involving the large vessels of the skull base. No unexpected hyperdense vessel. Skull: Normal. Negative for fracture or focal lesion. Sinuses/Orbits: No acute finding. Other: Subcutaneous emphysema within the neck and extending into the facial structures including within the right temporal region. Air is also seen within the visualized pharynx and hypopharynx, likely extension from known pneumomediastinum. Partially visualized endotracheal and enteric tubes. IMPRESSION: 1. No acute intracranial abnormality. 2. Chronic microvascular  ischemic changes and cerebral volume loss. 3. Subcutaneous emphysema within the neck and extending into the facial structures including within the right temporal region. Air is also seen within the visualized pharynx and hypopharynx, likely extension from known pneumomediastinum. Electronically Signed   By: Davina Poke D.O.   On: 11/07/2019 12:42   DG CHEST PORT 1 VIEW  Result Date: 11/07/2019 CLINICAL DATA:  ARDS, COVID-19 positive. EXAM: PORTABLE CHEST 1 VIEW COMPARISON:  Same day. FINDINGS: Stable cardiomediastinal silhouette. Endotracheal and nasogastric tubes are unchanged in position. Stable bilateral chest tubes are noted. Minimal bilateral apical pneumothoraces are noted. Stable bilateral lung opacities are noted concerning for multifocal pneumonia. Stable right-sided  subcutaneous emphysema is noted. Bony thorax is unremarkable. IMPRESSION: Stable bilateral chest tubes. Minimal bilateral apical pneumothoraces are noted. Stable bilateral lung opacities are noted concerning for multifocal pneumonia. Stable right-sided subcutaneous emphysema is noted. Electronically Signed   By: Marijo Conception M.D.   On: 11/07/2019 12:02   DG CHEST PORT 1 VIEW  Result Date: 11/07/2019 CLINICAL DATA:  Reason for exam on COVID positive pt: Endotracheal tube present. EXAM: PORTABLE CHEST 1 VIEW COMPARISON:  Chest radiograph 11/06/2019 FINDINGS: Unchanged support apparatus. Stable cardiomediastinal contours status post median sternotomy and CABG. Persistent trace bilateral pneumothoraces in the lung apex. Unchanged extensive bilateral infiltrates. Diffuse subcutaneous emphysema about the chest and neck. No acute finding in the upper abdomen. IMPRESSION: Stable chest with persistent tiny bilateral apical pneumothoraces, extensive pulmonary infiltrates and subcutaneous emphysema. Electronically Signed   By: Audie Pinto M.D.   On: 11/07/2019 08:07   DG CHEST PORT 1 VIEW  Result Date: 11/06/2019 CLINICAL DATA:  Chest tube placement EXAM: PORTABLE CHEST 1 VIEW COMPARISON:  11/06/2019, 5:46 a.m. FINDINGS: Interval placement of left-sided pigtail chest tube and right-sided straight bore chest tube. There are small, less than 5% pneumothoraces at the bilateral lung apices, not significantly changed compared to prior examination. Endotracheal tube and esophagogastric tube are in unchanged position. Unchanged extensive bilateral heterogeneous and interstitial airspace opacity. Cardiomegaly status post median sternotomy and CABG. Extensive subcutaneous emphysema about the chest and neck. IMPRESSION: 1. Interval placement of left-sided pigtail chest tube and right-sided straight bore chest tube. 2. There are small, less than 5% pneumothoraces at the bilateral lung apices, not significantly changed  compared to prior examination. 3. Extensive subcutaneous emphysema about the chest and neck. 4. Extensive bilateral heterogeneous and interstitial airspace opacities, consistent with edema or infection and unchanged. Electronically Signed   By: Eddie Candle M.D.   On: 11/06/2019 15:57    Assessment/Plan  **Severe AKI:  Multifactorial, mainly ATN suspected in setting of critical illness.  Prognosis fairly grim with multiple organ failure, discussed with wife that we can support with dialysis for now but may not change outcome. With K 6.9 and severe AKI will initiate CRRT today after PCCM places line.  Orders placed.   **Hyperkalemia:  K 6.9, will correct with CRRT.   **VRDF:  Recent COVID now with suspected PEs, pneumothoraces, VAP.  Chest tubes and Antibiotics per primary  **Thrombocytopenia:  HIT negative, per primary  **GIB:  Holding anticoagulation; on PPI gtt, GI seeing  **DVTs: was on heparin gtt but on hold now with GIB.   **DM: per primary   Justin Mend 11-21-19, 3:17 PM

## 2019-12-06 NOTE — Progress Notes (Signed)
ANTICOAGULATION CONSULT NOTE - Follow Up Consult  Pharmacy Consult for Heparin Indication: pulmonary embolus  Allergies  Allergen Reactions  . Metoclopramide Hcl Hypertension    Reglan=Increases HBP symptoms    Patient Measurements: Height: 5\' 9"  (175.3 cm) Weight: 194 lb 3.6 oz (88.1 kg) IBW/kg (Calculated) : 70.7 Heparin Dosing Weight: 82.8 kg  Vital Signs: Temp: 97.6 F (36.4 C) (01/04 1129) Temp Source: Axillary (01/04 1129) BP: 139/57 (01/04 0846) Pulse Rate: 100 (01/04 0846)  Labs: Recent Labs    11/06/19 0408 11/06/19 0408 11/06/19 2049 11/06/19 2350 11/07/19 0329 11/07/19 1009 11/07/19 1025 Dec 08, 2019 0421 Dec 08, 2019 1250  HGB 8.7*  --   --   --  7.7* 8.2*  --  7.1*  --   HCT 31.3*  --   --   --  28.6* 24.0*  --  26.3*  --   PLT 188  --   --   --  206  --   --  212  --   APTT 85*   < >  --  61*  --   --  53* 84* 86*  LABPROT 22.3*  --   --   --   --   --   --   --   --   INR 2.0*  --   --   --   --   --   --   --   --   HEPARINUNFRC 1.42*  --   --  0.93*  --   --   --  1.01*  --   CREATININE 3.20*  --   --   --  3.12*  --   --   --   --   CKTOTAL 102  --   --   --   --   --   --   --   --   CKMB 3.4  --   --   --   --   --   --   --   --   TROPONINIHS  --   --  213*  --   --   --   --  267*  --    < > = values in this interval not displayed.    Estimated Creatinine Clearance: 22.8 mL/min (A) (by C-G formula based on SCr of 3.12 mg/dL (H)).   Assessment: 74 yo male with PE. Heparin was on hold for chest tube placement, now resumed. Using aPTTs for monitoring with recent apixaban. Confirmatory aPTT remains therapeutic. Hg trend down to 7.1, plt wnl. No active bleed issues reported.  Goal of Therapy:  aPTT 66-102 seconds  Heparin level 0.3-0.7 Monitor platelets by anticoagulation protocol: Yes   Plan:  Continue heparin at 1250 units/hr Monitor daily heparin level/aPTT/CBC, s/sx bleeding   Elicia Lamp, PharmD, BCPS Please check AMION for all [facility] contact numbers Clinical Pharmacist 12-08-19 1:47 PM

## 2019-12-06 NOTE — Discharge Summary (Signed)
Physician Death Summary  Patient ID: Matthew Bates MRN: [id number] DOB/AGE: 02/06/1946 74 y.o.  Admit date: 10/26/2019 Discharge date: 07-Dec-2019  Admission Diagnoses: Cardiac arrest  Discharge Diagnoses:  Cardiac arrest COVID-19 infection Acute respiratory failure Bilateral pneumothoraxes DVT, PE GERD Acute renal failure Hypokalemia Acute GI bleed  Discharged Condition: Deceased  Hospital Course: 74 yo male was discharged from [facility] on 12/19 after treatment for COVID 19 pneumonia (received remdesivir and steroids), and DVT with presumed PE and sent home on eliquis.  Developed progressive dyspnea with hypoxia and returned to ER on 12/26, and intubated by EMS prior to arrival.  Developed bradycardia with PEA, then VT requiring defibrillation and started on amiodarone.  Throughout his stay he has been unstable with multiple rounds of VT, hypotension requiring pressors.  He developed bilateral pneumothoraces on 1/2 requiring chest tube placement.  His renal function continued to get worse with potassium of 6.9 on 1/4.  He also developed significant lower GI bleed with hemorrhagic shock while on heparin. Heparin drip was held and GI were consulted. Nephrology were also consulted with preparations for initiating CVVH  We had multiple discussions with patient and son on 1/4 and code status was transitioned to DNR. Before CVVH was initiated he went into PEA arrest and was unable to be resuscitated.    Marshell Garfinkel MD [facility] Please see [email or web address] for pager details.  12-07-19, 5:27 PM

## 2019-12-06 NOTE — Progress Notes (Signed)
Pt started to have bloody loose stool, CCM notified, new orders placed.

## 2019-12-06 NOTE — Progress Notes (Signed)
Assisted tele visit to patient with son.  Arli Bree M, RN  

## 2019-12-06 NOTE — Consult Note (Addendum)
[facility] Consult: 12:32 PM 11-14-2019  LOS: 9 days    Referring Provider: Kimber Relic   Primary Care Physician:  Marton Redwood, MD Primary Gastroenterologist:  Dr. Henrene Pastor    Reason for Consultation: GI bleed in setting of Heparin.   HPI: Matthew Bates is a 74 y.o. male.  PMH hypertension.    DM 2.  CAD.  CHF.  CKD.  Anemia.  PVD, s/p CEA.    09/2013 colonoscopy.  3 diminutive polyps (TAs, no HGD), left-sided diverticulosis.  Repeat colonoscopy 5 years. Evaluated in the office by GI PA 08/17/2023 heme positive, iron deficiency anemia.  He did not have complaints of dyspnea, palpitations at the time.  Plan was for outpatient EGD, colonoscopy, continue oral iron as prescribed by PMD, famotidine 20 mg/day added. 09/21/2019 EGD. Distal esophageal erosions.  Small HH without erosions.  Normal examined duodenum, biopsied for rule out celiac (path: No villous atrophy or Increased intraepithelial lymphocytes.  Nonspecific histopathologic changes). 09/21/2019 colonoscopy.  2 mm polyp in descending colon, cold snared (TA, no HGD). Left-sided diverticulosis.  Nonbleeding internal hemorrhoids.  Hospitalized 12/5 - 10/23/19 at [facility] with Covid pneumonia.  Treated with remdesivir, steroids, convalescent plasma, Actemra.  Elevated transaminases noted.     Readmitted 9 days ago with PNA, bil pneumothorax, PEA then V. tach arrest requiring defibrillation.  VDRF. Chest tubes in place.    This AM started passing maroon bloody stools.  As he is sedated on vent, unable to determine if pt has abd pain or nausea, has not vomited.  BP stable in 130s - 140s/50s.  HR up to 34.   Heparin drip continues.    Continues tube feeds via FT.   Protonix switched from 40 mg IV q day (last dose 10 AM 11/06/18) to 80 mg bolus and  drip,  about to start now at 1340.   Hgb 7.7 0330 yest >> 8.2 10 AM yest  >> 7.1 (0420 this AM) >> repeat at 1320 w pndg results.  PTT 86. BUN rising, 108 >> 140 1/1 to 1/3 but creatinine lower during same time frame: 3.2 >> 3.1.       , Past Medical History:  Diagnosis Date  . Anemia   . CAD (coronary artery disease)   . Carotid artery occlusion   . Cataract   . Chronic kidney disease   . COVID-19   . DM (diabetes mellitus) ([place])   . GERD (gastroesophageal reflux disease)   . Gout   . Hepatitis    as a child  . High triglycerides   . HTN (hypertension)   . Obesity     Past Surgical History:  Procedure Laterality Date  . ANGIOPLASTY     --cardiac  . APPENDECTOMY    . CAROTID ENDARTERECTOMY  05/22/11   Left CEA  . CATARACT EXTRACTION Bilateral   . COLONOSCOPY  10-09-13  . KNEE SURGERY Right   . PR VEIN BYPASS GRAFT,AORTO-FEM-POP  2010  . spleen surgery      Prior to Admission medications   Medication Sig Start  Date End Date Taking? Authorizing Provider  acetaminophen (TYLENOL) 325 MG tablet Take 2 tablets (650 mg total) by mouth every 6 (six) hours as needed for mild pain or headache (fever >/= 101). 10/23/19  Yes Cherene Altes, MD  amLODipine (NORVASC) 10 MG tablet Take 1 tablet (10 mg total) by mouth daily. 10/24/19  Yes Cherene Altes, MD  apixaban (ELIQUIS) 5 MG TABS tablet Take 2 tablets ('10mg'$ ) twice daily for 7 days, then 1 tablet ('5mg'$ ) twice daily 10/23/19  Yes Cherene Altes, MD  aspirin EC 81 MG tablet Take 81 mg by mouth daily.   Yes [provider]  Cyanocobalamin (B-12) 2500 MCG TABS Take 2,500 mcg by mouth daily.    Yes [provider]  famotidine (PEPCID) 40 MG tablet Take 40 mg by mouth 2 (two) times daily.   Yes [provider]  fenofibrate 160 MG tablet Take 1 tablet (160 mg total) by mouth daily. 12/04/11  Yes Larey Dresser, MD  FERREX 150 150 MG capsule Take 150 mg by mouth daily.  07/20/19  Yes [provider]   folic acid (FOLVITE) 1 MG tablet Take 1 mg by mouth daily.     Yes [provider]  furosemide (LASIX) 40 MG tablet Take 1 tablet (40 mg total) by mouth daily. 10/25/19  Yes Cherene Altes, MD  labetalol (NORMODYNE) 200 MG tablet TAKE 1 TABLET BY MOUTH TWICE A DAY Patient taking differently: Take 200 mg by mouth 3 (three) times daily.  08/02/15  Yes Larey Dresser, MD  linagliptin (TRADJENTA) 5 MG TABS tablet Take 1 tablet (5 mg total) by mouth daily. 10/24/19  Yes Cherene Altes, MD  metFORMIN (GLUCOPHAGE) 1000 MG tablet Take 1,000 mg by mouth 2 (two) times daily with a meal.  02/09/11  Yes [provider]  Multiple Vitamin (MULTIVITAMIN) capsule Take 1 capsule by mouth daily.     Yes [provider]  rosuvastatin (CRESTOR) 20 MG tablet Take 20 mg by mouth daily. 02/11/16  Yes [provider]    Scheduled Meds: . aspirin  81 mg Per Tube Daily  . chlorhexidine gluconate (MEDLINE KIT)  15 mL Mouth Rinse BID  . Chlorhexidine Gluconate Cloth  6 each Topical Q0600  . feeding supplement (PRO-STAT SUGAR FREE 64)  30 mL Per Tube QID  . fenofibrate  160 mg Per Tube Daily  . free water  300 mL Per Tube Q3H  . insulin aspart  0-15 Units Subcutaneous Q4H  . insulin detemir  4 Units Subcutaneous BID  . mouth rinse  15 mL Mouth Rinse 10 times per day  . nutrition supplement (JUVEN)  1 packet Per Tube BID BM  . [START ON 11/12/2019] pantoprazole  40 mg Intravenous Q12H  . pantoprazole sodium  40 mg Per Tube Q24H  . rosuvastatin  20 mg Per Tube Daily   Infusions: . sodium chloride 10 mL/hr at 12-08-19 0500  . ceFEPime (MAXIPIME) IV Stopped (11/07/19 2223)  . feeding supplement (VITAL 1.5 CAL) 1,000 mL (11/07/19 2010)  . fentaNYL infusion INTRAVENOUS 25 mcg/hr (2019/12/08 0500)  . heparin 1,250 Units/hr (12-08-19 0500)  . lactated ringers    . midazolam Stopped (11/07/19 2152)  . pantoprazole (PROTONIX) IVPB    . pantoprozole (PROTONIX) infusion    . [START  ON 11/09/2019] vancomycin     PRN Meds: bisacodyl, docusate, fentaNYL (SUBLIMAZE) injection, midazolam, [DISCONTINUED] ondansetron **OR** ondansetron (ZOFRAN) IV   Allergies as of 11/01/2019 - Review Complete  10/15/2019  Allergen Reaction Noted  . Metoclopramide hcl Hypertension 04/26/2011    Family History  Problem Relation Age of Onset  . Heart attack Father   . Hyperlipidemia Father   . Hypertension Father   . Heart disease Father        Heart Disease before age 68- PVD  . Diabetes Brother   . Heart disease Brother        Heart Disese before age 34  . Hyperlipidemia Brother   . Hypertension Brother   . Leukemia Brother   . Heart disease Mother        Heart Disease before age 32  . Hyperlipidemia Mother   . Hypertension Mother   . Heart attack Mother   . Coronary artery disease Other   . Colon cancer Neg Hx   . Esophageal cancer Neg Hx   . Pancreatic cancer Neg Hx   . Stomach cancer Neg Hx   . Liver disease Neg Hx   . Rectal cancer Neg Hx     Social History   Socioeconomic History  . Marital status: Married    Spouse name: Not on file  . Number of children: Not on file  . Years of education: Not on file  . Highest education level: Not on file  Occupational History  . Not on file  Tobacco Use  . Smoking status: Former Smoker    Types: Cigarettes    Quit date: 11/04/1970    Years since quitting: 49.0  . Smokeless tobacco: Never Used  Substance and Sexual Activity  . Alcohol use: Yes    Comment: rare  . Drug use: No  . Sexual activity: Not on file  Other Topics Concern  . Not on file  Social History Narrative  . Not on file   Social Determinants of Health   Financial Resource Strain:   . Difficulty of Paying Living Expenses: Not on file  Food Insecurity:   . Worried About Charity fundraiser in the Last Year: Not on file  . Ran Out of Food in the Last Year: Not on file  Transportation Needs:   . Lack of Transportation (Medical): Not on file  . Lack  of Transportation (Non-Medical): Not on file  Physical Activity:   . Days of Exercise per Week: Not on file  . Minutes of Exercise per Session: Not on file  Stress:   . Feeling of Stress : Not on file  Social Connections:   . Frequency of Communication with Friends and Family: Not on file  . Frequency of Social Gatherings with Friends and Family: Not on file  . Attends Religious Services: Not on file  . Active Member of Clubs or Organizations: Not on file  . Attends Archivist Meetings: Not on file  . Marital Status: Not on file  Intimate Partner Violence:   . Fear of Current or Ex-Partner: Not on file  . Emotionally Abused: Not on file  . Physically Abused: Not on file  . Sexually Abused: Not on file    REVIEW OF SYSTEMS: Pt sedated, unable to obtain ROS.     PHYSICAL EXAM: Vital signs in last 24 hours: Vitals:   Nov 10, 2019 0847 2019-11-10 1129  BP:    Pulse:    Resp:    Temp:  97.6 F (36.4 C)  SpO2: 95%    Wt Readings from Last 3 Encounters:  11-10-19 88.1 kg  10/09/19 85.7 kg  09/21/19 85.7 kg    Not examined  Intake/Output from previous day: 01/03 0701 - 01/04 0700 In: 1660.8 [I.V.:410.8; NG/GT:1150; IV Piggyback:100] Out: 9702 [Urine:1205; Chest Tube:306] Intake/Output this shift: No intake/output data recorded.  LAB RESULTS: Recent Labs    11/06/19 0408 11/07/19 0329 11/07/19 1009 11/21/2019 0421  WBC 11.4* 7.1  --  12.2*  HGB 8.7* 7.7* 8.2* 7.1*  HCT 31.3* 28.6* 24.0* 26.3*  PLT 188 206  --  212   BMET Lab Results  Component Value Date   NA 155 (H) 11/07/2019   NA 154 (H) 11/07/2019   NA 153 (H) 11/06/2019   K 5.5 (H) 11/07/2019   K 5.3 (H) 11/07/2019   K 5.3 (H) 11/06/2019   CL 116 (H) 11/07/2019   CL 116 (H) 11/06/2019   CL 115 (H) 11/05/2019   CO2 29 11/07/2019   CO2 26 11/06/2019   CO2 26 11/05/2019   GLUCOSE 263 (H) 11/07/2019   GLUCOSE 293 (H) 11/06/2019   GLUCOSE 176 (H) 11/05/2019   BUN 140 (H) 11/07/2019   BUN 130  (H) 11/06/2019   BUN 108 (H) 11/05/2019   CREATININE 3.12 (H) 11/07/2019   CREATININE 3.20 (H) 11/06/2019   CREATININE 3.21 (H) 11/05/2019   CALCIUM 7.8 (L) 11/07/2019   CALCIUM 7.8 (L) 11/06/2019   CALCIUM 7.7 (L) 11/05/2019   LFT Recent Labs    11/06/19 0408  PROT 5.9*  ALBUMIN 1.9*  AST 21  ALT 24  ALKPHOS 75  BILITOT 0.7   PT/INR Lab Results  Component Value Date   INR 2.0 (H) 11/06/2019   INR 3.9 (H) 10/31/2019   INR 0.99 05/21/2011   Hepatitis Panel No results for input(s): HEPBSAG, HCVAB, HEPAIGM, HEPBIGM in the last 72 hours. C-Diff No components found for: CDIFF Lipase  No results found for: LIPASE  Drugs of Abuse  No results found for: LABOPIA, COCAINSCRNUR, LABBENZ, AMPHETMU, THCU, LABBARB   RADIOLOGY STUDIES: CT HEAD WO CONTRAST  Result Date: 11/07/2019 CLINICAL DATA:  Encephalopathy EXAM: CT HEAD WITHOUT CONTRAST TECHNIQUE: Contiguous axial images were obtained from the base of the skull through the vertex without intravenous contrast. COMPARISON:  10/31/2019 FINDINGS: Brain: No evidence of acute infarction, hemorrhage, hydrocephalus, extra-axial collection or mass lesion/mass effect. Scattered low-density changes within the periventricular and subcortical white matter compatible with chronic microvascular ischemic change. Mild diffuse cerebral volume loss. Vascular: Mild atherosclerotic calcifications involving the large vessels of the skull base. No unexpected hyperdense vessel. Skull: Normal. Negative for fracture or focal lesion. Sinuses/Orbits: No acute finding. Other: Subcutaneous emphysema within the neck and extending into the facial structures including within the right temporal region. Air is also seen within the visualized pharynx and hypopharynx, likely extension from known pneumomediastinum. Partially visualized endotracheal and enteric tubes. IMPRESSION: 1. No acute intracranial abnormality. 2. Chronic microvascular ischemic changes and cerebral  volume loss. 3. Subcutaneous emphysema within the neck and extending into the facial structures including within the right temporal region. Air is also seen within the visualized pharynx and hypopharynx, likely extension from known pneumomediastinum. Electronically Signed   By: Davina Poke D.O.   On: 11/07/2019 12:42   DG CHEST PORT 1 VIEW  Result Date: 11/07/2019 CLINICAL DATA:  ARDS, COVID-19 positive. EXAM: PORTABLE CHEST 1 VIEW COMPARISON:  Same day. FINDINGS: Stable cardiomediastinal silhouette. Endotracheal and nasogastric tubes are unchanged in position. Stable bilateral chest tubes are noted. Minimal bilateral apical pneumothoraces are noted. Stable bilateral lung opacities are noted concerning for multifocal pneumonia. Stable right-sided subcutaneous emphysema is noted. Bony thorax is unremarkable. IMPRESSION:  Stable bilateral chest tubes. Minimal bilateral apical pneumothoraces are noted. Stable bilateral lung opacities are noted concerning for multifocal pneumonia. Stable right-sided subcutaneous emphysema is noted. Electronically Signed   By: Marijo Conception M.D.   On: 11/07/2019 12:02   DG CHEST PORT 1 VIEW  Result Date: 11/07/2019 CLINICAL DATA:  Reason for exam on COVID positive pt: Endotracheal tube present. EXAM: PORTABLE CHEST 1 VIEW COMPARISON:  Chest radiograph 11/06/2019 FINDINGS: Unchanged support apparatus. Stable cardiomediastinal contours status post median sternotomy and CABG. Persistent trace bilateral pneumothoraces in the lung apex. Unchanged extensive bilateral infiltrates. Diffuse subcutaneous emphysema about the chest and neck. No acute finding in the upper abdomen. IMPRESSION: Stable chest with persistent tiny bilateral apical pneumothoraces, extensive pulmonary infiltrates and subcutaneous emphysema. Electronically Signed   By: Audie Pinto M.D.   On: 11/07/2019 08:07   DG CHEST PORT 1 VIEW  Result Date: 11/06/2019 CLINICAL DATA:  Chest tube placement EXAM:  PORTABLE CHEST 1 VIEW COMPARISON:  11/06/2019, 5:46 a.m. FINDINGS: Interval placement of left-sided pigtail chest tube and right-sided straight bore chest tube. There are small, less than 5% pneumothoraces at the bilateral lung apices, not significantly changed compared to prior examination. Endotracheal tube and esophagogastric tube are in unchanged position. Unchanged extensive bilateral heterogeneous and interstitial airspace opacity. Cardiomegaly status post median sternotomy and CABG. Extensive subcutaneous emphysema about the chest and neck. IMPRESSION: 1. Interval placement of left-sided pigtail chest tube and right-sided straight bore chest tube. 2. There are small, less than 5% pneumothoraces at the bilateral lung apices, not significantly changed compared to prior examination. 3. Extensive subcutaneous emphysema about the chest and neck. 4. Extensive bilateral heterogeneous and interstitial airspace opacities, consistent with edema or infection and unchanged. Electronically Signed   By: Eddie Candle M.D.   On: 11/06/2019 15:57     IMPRESSION:   *     GI bleed.  Maroon. Bloody stools. In setting of heparin.   Just had EGD and colonoscopy in mid 09/2019 with studies revealing distal esophageal ulcers, benign hiatal hernia, small adenomatous colon polyp, colon diverticulosis. .    *   Acute on chronic anemia.  Iron deficient, takes oral iron at home.  *     COVID-19 pneumonia.  Treated with remdesivir, steroids, discharged 10/23/2019.  *     bil DVT, PE, A. fib during Covid pneumonia hospitalization.Marland Kitchen  Discharged on Eliquis.  Heparin initiated at the beginning of this current admission and continues currently.  Acute on chronic CHF.  Hx CAD.  *    Bradycardia, VDRF, PEA then V. tach requiring defibrillation.  Patient intubated.  Requiring pressor support.  *    Thrombocytopenia.  HIT antibody negative.  *    Bilateral pneumothorax requiring chest tube placement.  *     AKI.  Improving  creatinine, rising BUN.  Waiting on this afternoon's BMET.    *   Elevated LFTs during [facility] admission last month.  With exception of low albumin of 1.9, LFTs currently WNL.     PLAN:     *  Supportive care w PPI drip, serial Hgbs.    *   Any chance of stopping Heparin for a few hours, if not longer?    Azucena Freed  2019/12/06, 12:32 PM Phone [phone]  GI ATTENDING  History, laboratories, x-rays, recent endoscopy reports reviewed.  Agree with comprehensive consultation note as outlined above.  Critically ill gentleman with multiple medical problems as outlined above.  We are asked to  see for GI bleeding in the face of anticoagulation therapy for deep vein thrombosis.  Patient's recent upper endoscopy revealed mild esophagitis but was otherwise normal.  Colonoscopy revealed diverticulosis.  Normal terminal ileum.  Diminutive polyp and hemorrhoids.  Suspect current bleeding may be secondary to transient colonic ischemia given clinical events versus diverticular source.  I agree with maximal supportive care at this point.  Continue PPI therapy for upper GI mucosal protection.  If bleeding persists on heparin, consider placement of IVC filter to allow for discontinuation of anticoagulation therapy.  Transfuse as needed.  Will follow.  Docia Chuck. Geri Seminole., M.D. [facility] Division of Gastroenterology

## 2019-12-06 NOTE — Progress Notes (Signed)
Assisted tele visit to patient with wife.  Micheil Klaus M, RN  

## 2019-12-06 NOTE — Progress Notes (Signed)
Inpatient Diabetes Program Recommendations  AACE/ADA: New Consensus Statement on Inpatient Glycemic Control (2015)  Target Ranges:  Prepandial:   less than 140 mg/dL      Peak postprandial:   less than 180 mg/dL (1-2 hours)      Critically ill patients:  140 - 180 mg/dL   Lab Results  Component Value Date   GLUCAP 344 (H) 2019/11/21   HGBA1C 7.2 (H) 10/09/2019    Review of Glycemic Control Results for Matthew Bates, Matthew Bates (MRN [id number]) as of 2019-11-21 10:02  Ref. Range 11/07/2019 20:08 11/07/2019 23:51 11/21/19 04:25 November 21, 2019 07:55  Glucose-Capillary Latest Ref Range: 70 - 99 mg/dL 269 (H) 288 (H) 294 (H) 344 (H)   Diabetes history: Type 2 DM Current orders for Inpatient glycemic control: Novolog 0-15 units Q4H, Levemir 4 units BID Vital 1.5 cal 50 ml/hr  Inpatient Diabetes Program Recommendations:    Consider: -adding Novolog 4 units Q4H for tube feed coverage (to be stopped or held if tube feeds are stopped) -Increasing Levemir to 6 units BID.   Thanks, Bronson Curb, MSN, RNC-OB Diabetes Coordinator [phone] (8a-5p)

## 2019-12-06 NOTE — Progress Notes (Signed)
Assisted tele visit to patient with wife.  Kache Mcclurg M, RN  

## 2019-12-06 NOTE — Progress Notes (Signed)
Assisted tele visit to patient with son.  Gwyn Mehring M, RN  

## 2019-12-06 NOTE — Progress Notes (Signed)
PCCM progress note  Patient follow-up lower GI bleed with Dr. Elta Guadeloupe for rectal Hemoglobin 6.5, potassium 6.9, creatinine 4.3 with low urine output  Heparin is on hold for GI bleed.  We are not able to adequately treat his DVT Transfuse blood Will likely need CRRT to fix the potassium  Discussed his worsening clinical status with his wife.  Recommended limitations of care and DNR She wants to discuss further with his son before that he knows At this point she is okay for dialysis.  The patient is critically ill with multiple organ system failure and requires high complexity decision making for assessment and support, frequent evaluation and titration of therapies, advanced monitoring, review of radiographic studies and interpretation of complex data.   Critical Care Time devoted to patient care services, exclusive of separately billable procedures, described in this note is 35 minutes.   Marshell Garfinkel MD [facility] Please see [email or web address] for pager details.  November 26, 2019, 2:42 PM

## 2019-12-06 NOTE — Progress Notes (Signed)
CRITICAL VALUE ALERT  Critical Value:  Potassium 6.9  Date & Time Notied:  2019-11-19  Provider Notified: Dr. Vaughan Browner  Orders Received/Actions taken: No new orders given. MD to evaluate.

## 2019-12-06 NOTE — Progress Notes (Signed)
Approximately 1430 pt started to have a large amount of loose bloody stool, eventually became hypotensive. CCM was made and placed orders. Family was called and informed of pt not doing well. Pt son and Pt wife discussed and wanted to change pt to DNR except they wanted pressors. Levo was started and maxed out, Bolus of LR was also started. Epi, calcium gluconate, D50, Iv insulin was administered as well. Spoke with family during this event and they changed their mind about hd cath and wife started to head to hospital. Pt then became brady with long pauses. Eventually going into PEA and then asystole. Pt family very grateful for the care pt received. Time of death 67. Called by CCM. Pt wife came to hospital and visited pt after pt passed away for 15 mins while wearing PPE.

## 2019-12-06 NOTE — Progress Notes (Signed)
ANTICOAGULATION CONSULT NOTE - Follow Up Consult  Pharmacy Consult for Heparin Indication: pulmonary embolus  Allergies  Allergen Reactions  . Metoclopramide Hcl Hypertension    Reglan=Increases HBP symptoms    Patient Measurements: Height: 5\' 9"  (175.3 cm) Weight: 194 lb 3.6 oz (88.1 kg) IBW/kg (Calculated) : 70.7 Heparin Dosing Weight: 82.8 kg  Vital Signs: Temp: 97.9 F (36.6 C) (01/04 0400) Temp Source: Oral (01/04 0400) BP: 143/58 (01/04 0500) Pulse Rate: 99 (01/04 0500)  Labs: Recent Labs    11/06/19 0408 11/06/19 2049 11/06/19 2350 11/07/19 0329 11/07/19 1009 11/07/19 1025 Dec 05, 2019 0421  HGB 8.7*  --   --  7.7* 8.2*  --  7.1*  HCT 31.3*  --   --  28.6* 24.0*  --  26.3*  PLT 188  --   --  206  --   --  212  APTT 85*  --  61*  --   --  53* 84*  LABPROT 22.3*  --   --   --   --   --   --   INR 2.0*  --   --   --   --   --   --   HEPARINUNFRC 1.42*  --  0.93*  --   --   --  1.01*  CREATININE 3.20*  --   --  3.12*  --   --   --   CKTOTAL 102  --   --   --   --   --   --   CKMB 3.4  --   --   --   --   --   --   TROPONINIHS  --  213*  --   --   --   --  267*    Estimated Creatinine Clearance: 22.8 mL/min (A) (by C-G formula based on SCr of 3.12 mg/dL (H)).   Assessment: 74 yo male with PE.  Heparin was on hold for chest tube placement to to be resumed. Using aPTTs for monitoring with recent apixaban  1/4 AM update:  APTT therapeutic   Goal of Therapy:  aPTT 66-102 seconds Monitor platelets by anticoagulation protocol: Yes   Plan:  -Cont heparin at 1250 units/hr -Confirmatory aPTT at 1200 -Daily aPTT, heparin level and CBC  Narda Bonds, PharmD, BCPS Clinical Pharmacist Phone: [phone]

## 2019-12-06 NOTE — Progress Notes (Addendum)
PCCM progress note  Called to the bedside for ongoing GI bleed, hyperkalemia, hemodynamic instability Patient has become increasingly bradycardic and hypotensive. Started Levophed  Patient went to a brief PEA arrest, requiring 1 amp epi Spoke with wife and son on phone. Code status changed to DNR with no escalation of care Wife is on way in to be at his bedside with plans to transition to comfort care on arrival  The patient is critically ill with multiple organ system failure and requires high complexity decision making for assessment and support, frequent evaluation and titration of therapies, advanced monitoring, review of radiographic studies and interpretation of complex data.   Critical Care Time devoted to patient care services, exclusive of separately billable procedures, described in this note is 35 minutes.   Marshell Garfinkel MD [facility] Please see [email or web address] for pager details.  2019/12/03, 3:55 PM

## 2019-12-06 NOTE — Progress Notes (Signed)
NAME:  Matthew Bates, MRN:  [id number], DOB:  [birth year], LOS: 9 ADMISSION DATE:  10/29/2019, CONSULTATION DATE:  10/31/19 CHIEF COMPLAINT:  Cardiac arrest  Brief History   74 yo male was discharged from [facility] on 12/19 after treatment for COVID 19 pneumonia (received remdesivir and steroids), and DVT with presumed PE and sent home on eliquis.  Developed progressive dyspnea with hypoxia and returned to ER on 12/26, and intubated by EMS prior to arrival.  Developed bradycardia with PEA, then VT requiring defibrillation and started on amiodarone.  Past Medical History  CAD Cataract CKD DM GERD GOUT Hepatitis High triglycerides HTN Obesity  Significant Hospital Events   12/05 to 12/19 Admit to [facility] with covid 12/26 Admit to [facility] after PEA/VT cardiac arrest with hypoxia and VDRF. ECHO 12/6  - lvef 45% and repeat 12/27 - similar 12/29 multiple runs of VT, amio stopped 12/31 - Soft BP after lasix yesterday requiring low dose levo.Vent dysynchrony requiring increased sedation overnight. 1/1 -  On vent. Off pededex, Off levophed gtt. Remains on fent gtt and on IV heparin gtt. Increased fio2 need last night to 60% (now 50%) and peep 10.  T max 99/5F. WBC 10-11k. PCT coming down through 11/03/2019. AKI better - creat 3.2 but Na worse 153 and K 5.0  1/2 -  B.L Ptx requiring chest tubes  Consults:    Procedures:  ETT 12/26 >>  Rt femoral CVL 12/26 >>  Needs change 1/2 or 1/3 to IJ/subclav (held off 1/2 due to lot of subcut air) Rt radial a line 12/27 >>  Rt 20 Fr chest tube (ptx) 1/2 >> Left Wayne 1/2 >>  Significant Diagnostic Tests:  12/11 B/l LE venous doppler: Right: Findings consistent with acute deep vein thrombosis involving the right posterior tibial veins, and right peroneal veins. Left: Findings consistent with acute deep vein thrombosis involving the left soleal veins, and left gastrocnemius veins. No cystic structure found in the popliteal fossa.  CT head 12/27 >> no acute  findings Echo 12/27 >> LVEF 45-50%, RV with reduced systolic function and enlargement, LA dilated  Micro Data:  Blood 12/26 >>ngtd Blood 12/27: ngtd Urine 12/27: neg Sputum 12/26 >> not obtained 1/2 trach aspirate ->   Antimicrobials:  Vancomycin 12/26 >>12/29 Cefepime 12/26 >>  1/1 Zosyn 12/26 > 12/27  vanc 1/2 Cefepime 1/2    Interim history/subjective:    Objective   Blood pressure (!) 139/57, pulse 100, temperature (!) 97.1 F (36.2 C), temperature source Axillary, resp. rate (!) 34, height 5\' 9"  (1.753 m), weight 88.1 kg, SpO2 95 %.    Vent Mode: PRVC FiO2 (%):  [70 %-80 %] 70 % Set Rate:  [32 bmp] 32 bmp Vt Set:  [420 mL] 420 mL PEEP:  [7 cmH20] 7 cmH20 Plateau Pressure:  [13 cmH20-28 cmH20] 13 cmH20   Intake/Output Summary (Last 24 hours) at 28-Nov-2019 1105 Last data filed at November 28, 2019 0700 Gross per 24 hour  Intake 1069.39 ml  Output 1011 ml  Net 58.39 ml   Filed Weights   11/06/19 0200 11/07/19 0425 11/28/2019 0500  Weight: 83.6 kg 85.7 kg 88.1 kg    Gen:      No acute distress, critcally ill apprearing HEENT:  EOMI, sclera anicteric Neck:     No masses; no thyromegaly, ETT Lungs:    Clear to auscultation bilaterally; normal respiratory effort CV:         Regular rate and rhythm; no murmurs Abd:      +  bowel sounds; soft, non-tender; no palpable masses, no distension Ext:    No edema; adequate peripheral perfusion Skin:      Warm and dry; no rash Neuro: Sedated   Assessment & Plan:  Acute on chronic respiratory failure secondary to covid 19, vap pneumonia, bilateral pneumothoraces PE suspected due to A. fib with right heart strain and posterior DVTs Continue full vent support, chest tube to suction Follow chest x-ray, ABG Deep sedation due to dyssynchrony. Continue broad antibiotic coverage Heparin drip  PEA/VT arrest secondary to respiratory failure Acute on chronic CHF, history of coronary artery disease Atrial fibrillation Continue aspirin,  Crestor  Bilateral DVTs Heparin drip  Thrombocytopenia secondary to critical illness HIT antibody negative Monitor CBC  AKI Monitor urine output and creatinine  Diabetes SSI, NovoLog  Pressure ulcers. Stage 2 Rt sacrum, present prior to this admission  Best practice:  Diet: tube feeds DVT prophylaxis: Heparin drip  GI prophylaxis: protonix Mobility: bed rest Code Status: Full code Disposition: ICU Family communication: Pending  LABS    PULMONARY Recent Labs  Lab 11/02/19 0338 11/03/19 0258 11/05/19 0148 11/05/19 1241 11/07/19 1009  PHART 7.304* 7.236* 7.315* 7.280* 7.256*  PCO2ART 52.6* 67.5* 54.9* 58.6* 71.5*  PO2ART 86.0 92.0 69.0* 84.0 98.0  HCO3 26.1 28.7* 28.0 27.4 31.7*  TCO2 28 31 30 29  34*  O2SAT 95.0 95.0 92.0 94.0 96.0    CBC Recent Labs  Lab 11/06/19 0408 11/07/19 0329 11/07/19 1009 11-26-19 0421  HGB 8.7* 7.7* 8.2* 7.1*  HCT 31.3* 28.6* 24.0* 26.3*  WBC 11.4* 7.1  --  12.2*  PLT 188 206  --  212    COAGULATION Recent Labs  Lab 11/06/19 0408  INR 2.0*    CARDIAC  No results for input(s): TROPONINI in the last 168 hours. No results for input(s): PROBNP in the last 168 hours.   CHEMISTRY Recent Labs  Lab 11/03/19 0208 11/04/19 0449 11/05/19 0340 11/05/19 1241 11/06/19 0408 11/07/19 0329 11/07/19 1009 11-26-2019 0421  NA 150* 153* 153* 153* 153* 154* 155*  --   K 4.7 4.6 5.0 5.2* 5.3* 5.3* 5.5*  --   CL 114* 114* 115*  --  116* 116*  --   --   CO2 27 24 26   --  26 29  --   --   GLUCOSE 146* 190* 176*  --  293* 263*  --   --   BUN 97* 98* 108*  --  130* 140*  --   --   CREATININE 3.46* 3.46* 3.21*  --  3.20* 3.12*  --   --   CALCIUM 7.4* 7.5* 7.7*  --  7.8* 7.8*  --   --   MG 2.4 2.3 2.5*  --  2.7* 2.5*  --  2.7*  PHOS 4.8* 4.6 5.4*  --  5.0* 3.5  --  4.1   Estimated Creatinine Clearance: 22.8 mL/min (A) (by C-G formula based on SCr of 3.12 mg/dL (H)).   LIVER Recent Labs  Lab 11/02/19 0335 11/03/19 0208  11/06/19 0408  AST 36 37 21  ALT 38 34 24  ALKPHOS 89 96 75  BILITOT 0.8 0.6 0.7  PROT 5.3* 5.8* 5.9*  ALBUMIN 1.8* 1.9* 1.9*  INR  --   --  2.0*     INFECTIOUS Recent Labs  Lab 11/01/19 1227 11/01/19 1645 11/05/19 1212 11/05/19 2341 11/06/19 0408 11/07/19 0329  LATICACIDVEN 1.7 1.5  --  0.8  --   --   PROCALCITON 1.77  --  0.34  --  0.44 0.50     ENDOCRINE CBG (last 3)  Recent Labs    11/07/19 2351 12/06/2019 0425 2019-12-06 0755  GLUCAP 288* 294* 344*    The patient is critically ill with multiple organ system failure and requires high complexity decision making for assessment and support, frequent evaluation and titration of therapies, advanced monitoring, review of radiographic studies and interpretation of complex data.   Critical Care Time devoted to patient care services, exclusive of separately billable procedures, described in this note is 35 minutes.   Marshell Garfinkel MD [facility] Please see [email or web address] for pager details.  12/06/19, 11:05 AM

## 2019-12-06 DEATH — deceased

## 2020-01-10 IMAGING — CT CT CHEST W/O CM
2 of 4 series · 15 of 36 positions shown, 18 images · non-contrast
Comparison: Radiograph yesterday.

CLINICAL DATA: Pneumothorax and pneumomediastinum.

EXAM:
CT CHEST WITHOUT CONTRAST
TECHNIQUE: Multidetector CT imaging of the chest was performed following the
standard protocol without IV contrast.

[Series 4: thorax 2.0 · axial · 0.98mm/px · z∈[-376,-52]mm · 12 of 182 slices shown, 15 images]
[im 10/182  mediastinal]
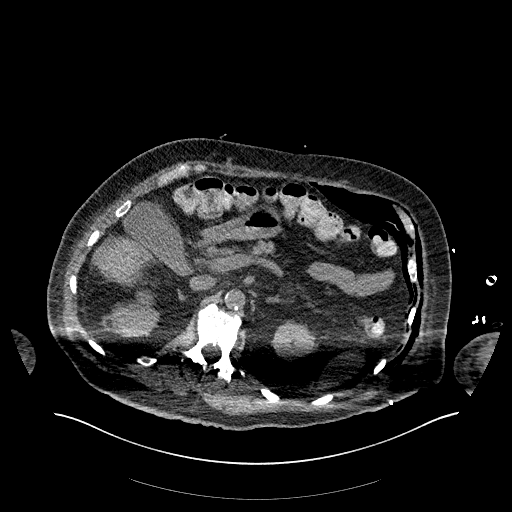
[im 10/182  lung]
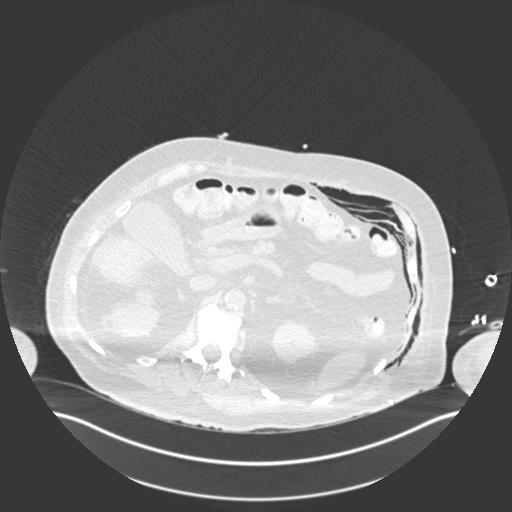
[im 29/182  lung]
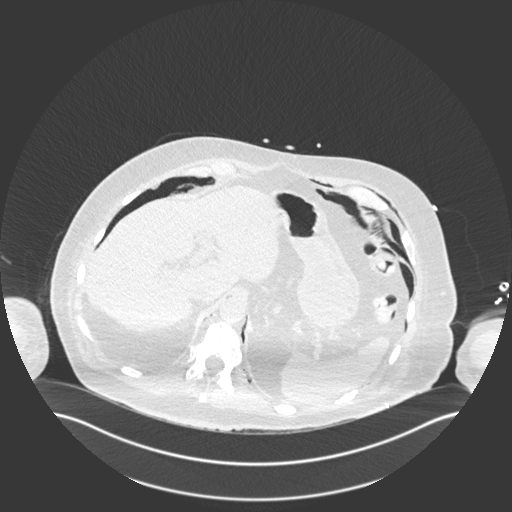
[im 39/182  lung]
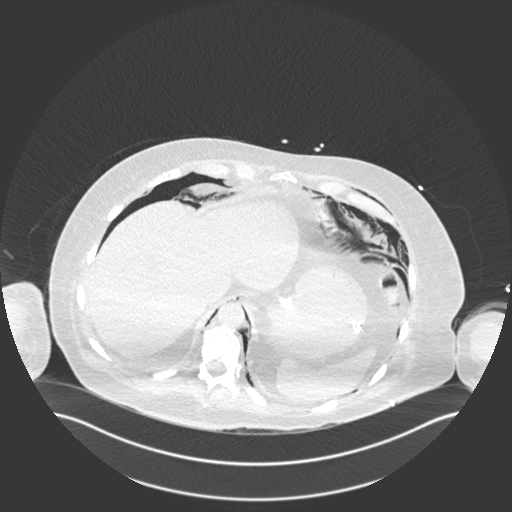
[im 58/182  lung]
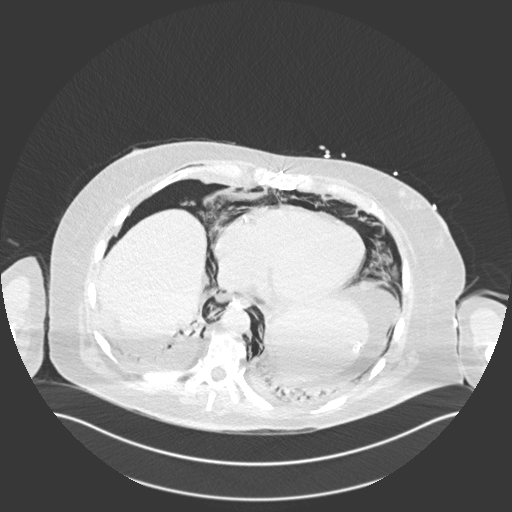
[im 67/182  mediastinal]
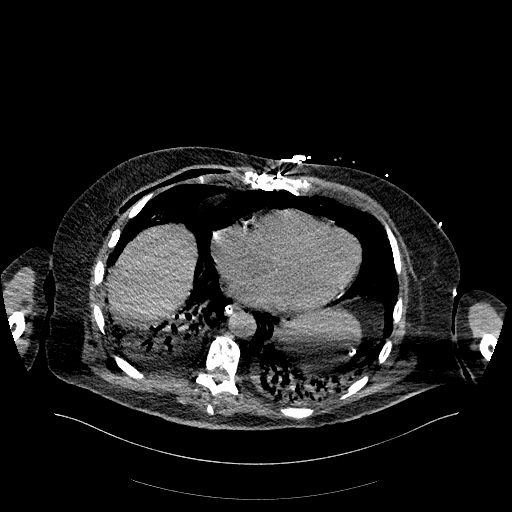
[im 67/182  lung]
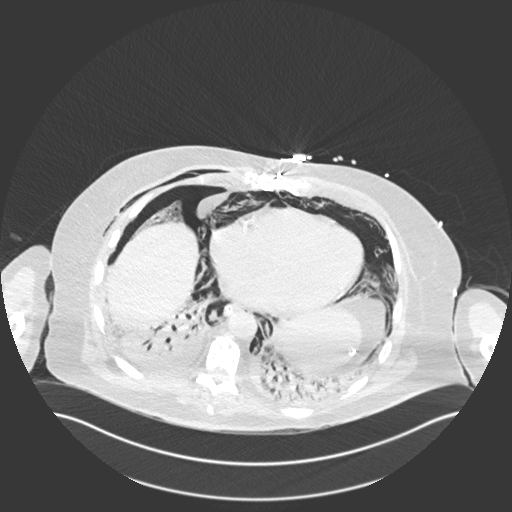
[im 86/182  lung]
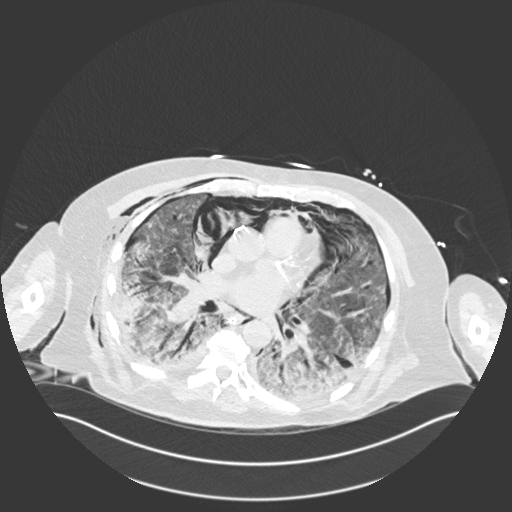
[im 96/182  lung]
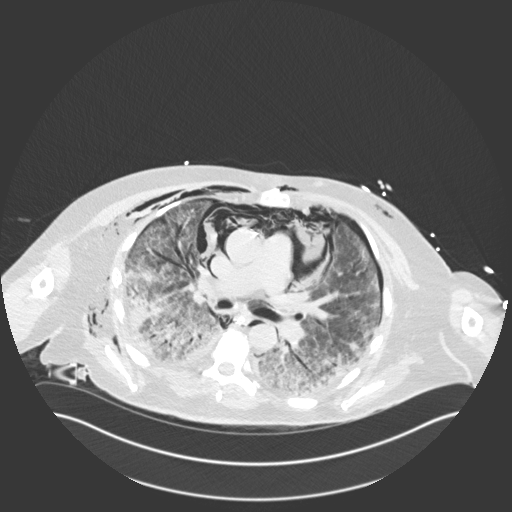
[im 115/182  lung]
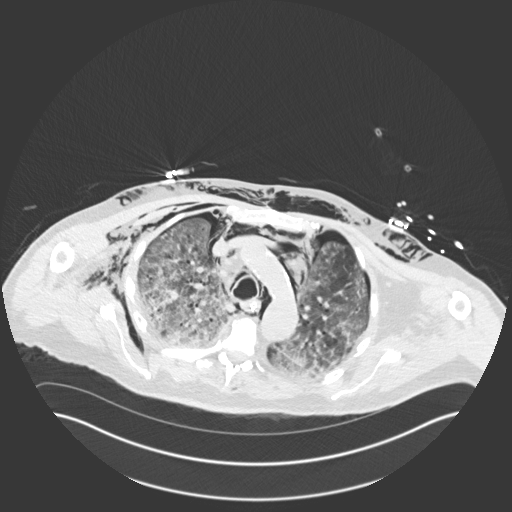
[im 124/182  mediastinal]
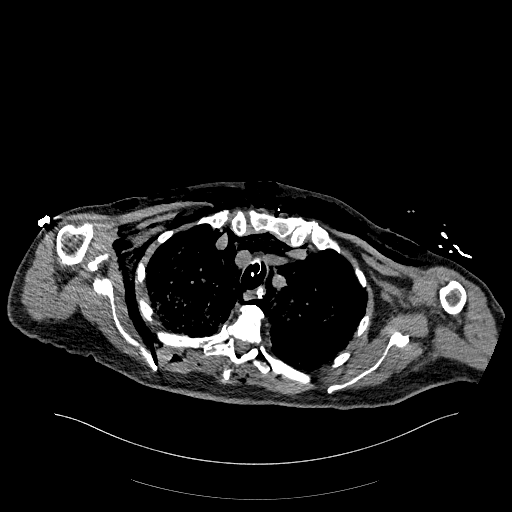
[im 124/182  lung]
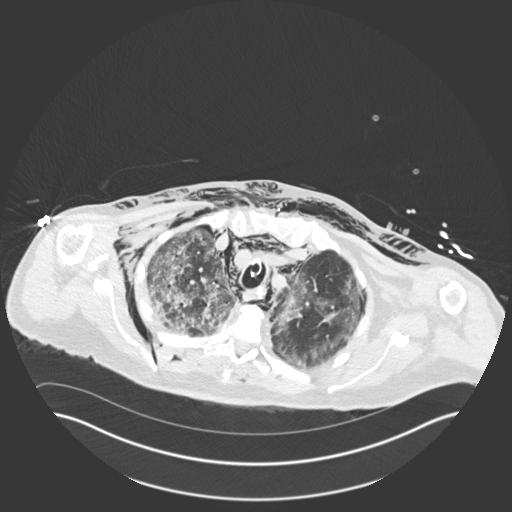
[im 143/182  lung]
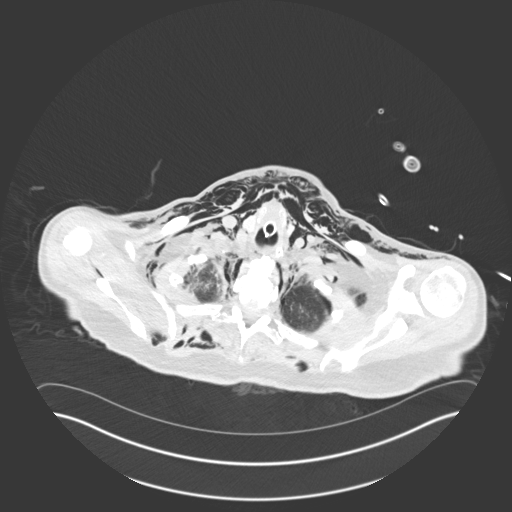
[im 153/182  lung]
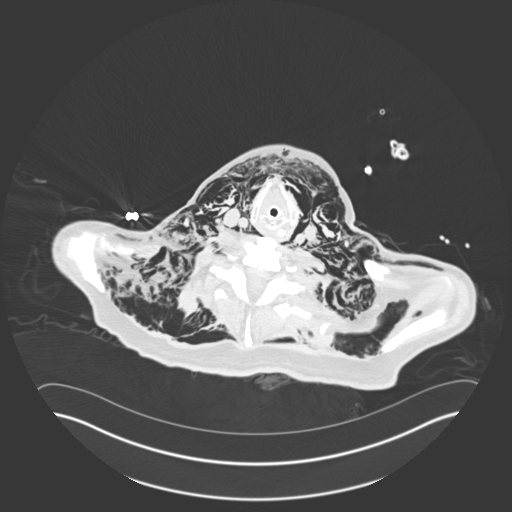
[im 172/182  lung]
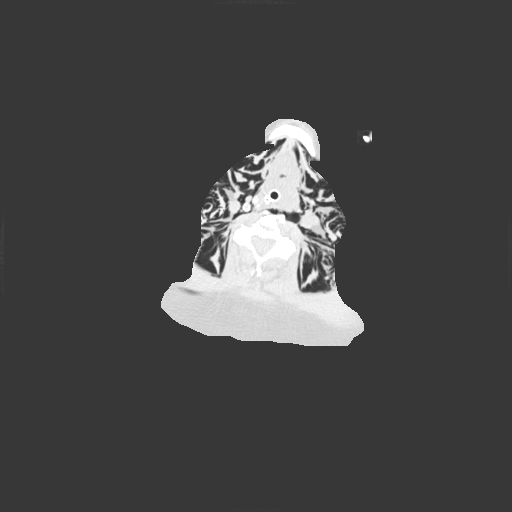

[Series 6: coronal · coronal · 0.71mm/px · 3 of 101 slices shown]
[im 21/101  lung]
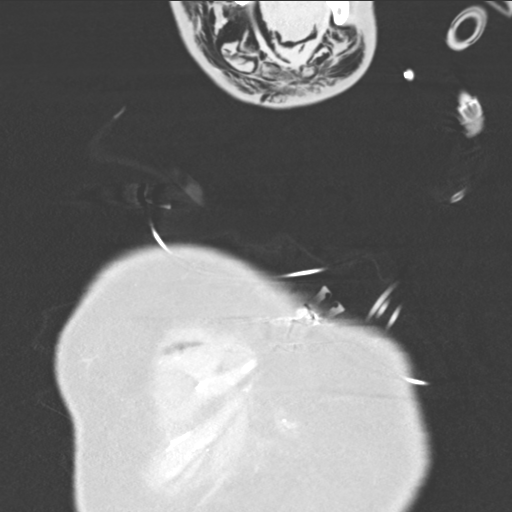
[im 41/101  lung]
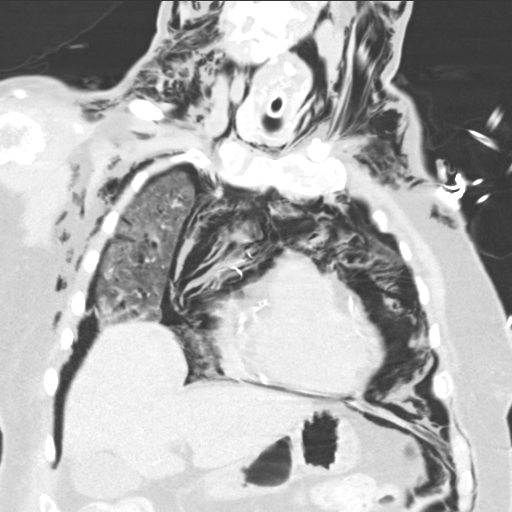
[im 61/101  lung]
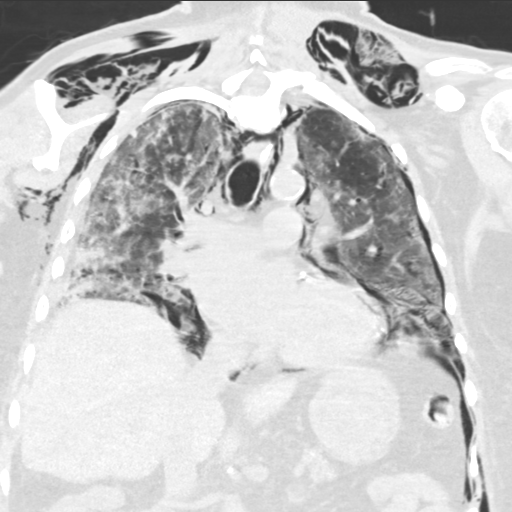

[15 of 36 positions shown; findings below may reference images not displayed]

FINDINGS: Cardiovascular: Aortic atherosclerosis. No aortic aneurysm. There
are coronary artery calcifications. Heart is normal in size. No
pericardial effusion.

Mediastinum/Nodes: Endotracheal and enteric tubes in place.
Extensive pneumomediastinum. Pneumomediastinum extends from the
thoracic inlet to the diaphragm. Circumferential air about the
heart. No obvious adenopathy. No thyroid nodule.

Lungs/Pleura: Small bilateral pneumothoraces. Diffuse bilateral lung
opacities with patchy ground-glass throughout both lungs,
consolidative peripheral opacities in both lower lobes and dependent
right upper lobe. Central air bronchograms. Small right and trace
left pleural effusion.

Upper Abdomen: Extrapleural air tracks anteriorly in the upper
abdomen.

Musculoskeletal: Extensive subcutaneous emphysema throughout the
right greater than left chest wall. Extensive air tracks into the
soft tissues of the neck and supraclavicular regions. Soft tissue
emphysema tracks in the anterior abdominal wall, more so on the
left. Median sternotomy. No acute osseous abnormality. Multilevel
degenerative change in the spine.
IMPRESSION: 1. Small bilateral pneumothoraces.
2. Extensive pneumomediastinum extending from the thoracic inlet to
the diaphragm. Circumferential air about the heart. Extrapleural air
tracks into the anterior upper abdomen.
3. Extensive subcutaneous emphysema about the chest wall soft
tissues and into the neck.
4. Moderate to severe diffuse bilateral airspace disease with
ground-glass and consolidative opacities, consistent with [id number]
pneumonia. Small right and trace left pleural effusions.

Aortic Atherosclerosis (410P3-9KT.T).
